# Patient Record
Sex: Male | Born: 1935
Health system: Southern US, Community
[De-identification: ages and names within clinical notes are randomized; demographics above are authoritative.]

## PROBLEM LIST (undated history)

## (undated) DIAGNOSIS — G250 Essential tremor: Secondary | ICD-10-CM

## (undated) DIAGNOSIS — J439 Emphysema, unspecified: Secondary | ICD-10-CM

## (undated) DIAGNOSIS — I10 Essential (primary) hypertension: Secondary | ICD-10-CM

## (undated) HISTORY — DX: Essential tremor: G25.0

## (undated) HISTORY — DX: Essential (primary) hypertension: I10

## (undated) HISTORY — DX: Emphysema, unspecified: J43.9

---

## 2015-01-13 ENCOUNTER — Ambulatory Visit (HOSPITAL_COMMUNITY)
Admission: RE | Admit: 2015-01-13 | Discharge: 2015-01-13 | Disposition: A | Discharge status: Home / Self Care | Payer: Medicare Other | Source: Ambulatory Visit | Attending: Family Medicine | Admitting: Family Medicine

## 2015-01-13 ENCOUNTER — Other Ambulatory Visit (HOSPITAL_COMMUNITY): Payer: Self-pay | Admitting: Family Medicine

## 2015-01-13 DIAGNOSIS — J449 Chronic obstructive pulmonary disease, unspecified: Secondary | ICD-10-CM | POA: Insufficient documentation

## 2015-01-13 DIAGNOSIS — R0602 Shortness of breath: Secondary | ICD-10-CM

## 2016-07-23 ENCOUNTER — Other Ambulatory Visit (HOSPITAL_COMMUNITY): Payer: Self-pay | Admitting: Family Medicine

## 2016-07-23 ENCOUNTER — Ambulatory Visit (HOSPITAL_COMMUNITY)
Admission: RE | Admit: 2016-07-23 | Discharge: 2016-07-23 | Disposition: A | Discharge status: Home / Self Care | Payer: Medicare Other | Source: Ambulatory Visit | Attending: Family Medicine | Admitting: Family Medicine

## 2016-07-23 DIAGNOSIS — M1990 Unspecified osteoarthritis, unspecified site: Secondary | ICD-10-CM

## 2016-07-23 DIAGNOSIS — M25512 Pain in left shoulder: Secondary | ICD-10-CM

## 2016-07-23 DIAGNOSIS — G8929 Other chronic pain: Secondary | ICD-10-CM

## 2016-07-23 DIAGNOSIS — M19012 Primary osteoarthritis, left shoulder: Secondary | ICD-10-CM | POA: Insufficient documentation

## 2016-07-23 DIAGNOSIS — M1711 Unilateral primary osteoarthritis, right knee: Secondary | ICD-10-CM | POA: Insufficient documentation

## 2016-07-23 DIAGNOSIS — M25561 Pain in right knee: Secondary | ICD-10-CM | POA: Diagnosis not present

## 2018-10-30 IMAGING — DX DG KNEE COMPLETE 4+V*R*
4 series · 4 of 4 positions shown · non-contrast
Comparison: None in PACs

CLINICAL DATA: Chronic right knee pain with no known injury

EXAM:
RIGHT KNEE - COMPLETE 4+ VIEW

[knee ap]
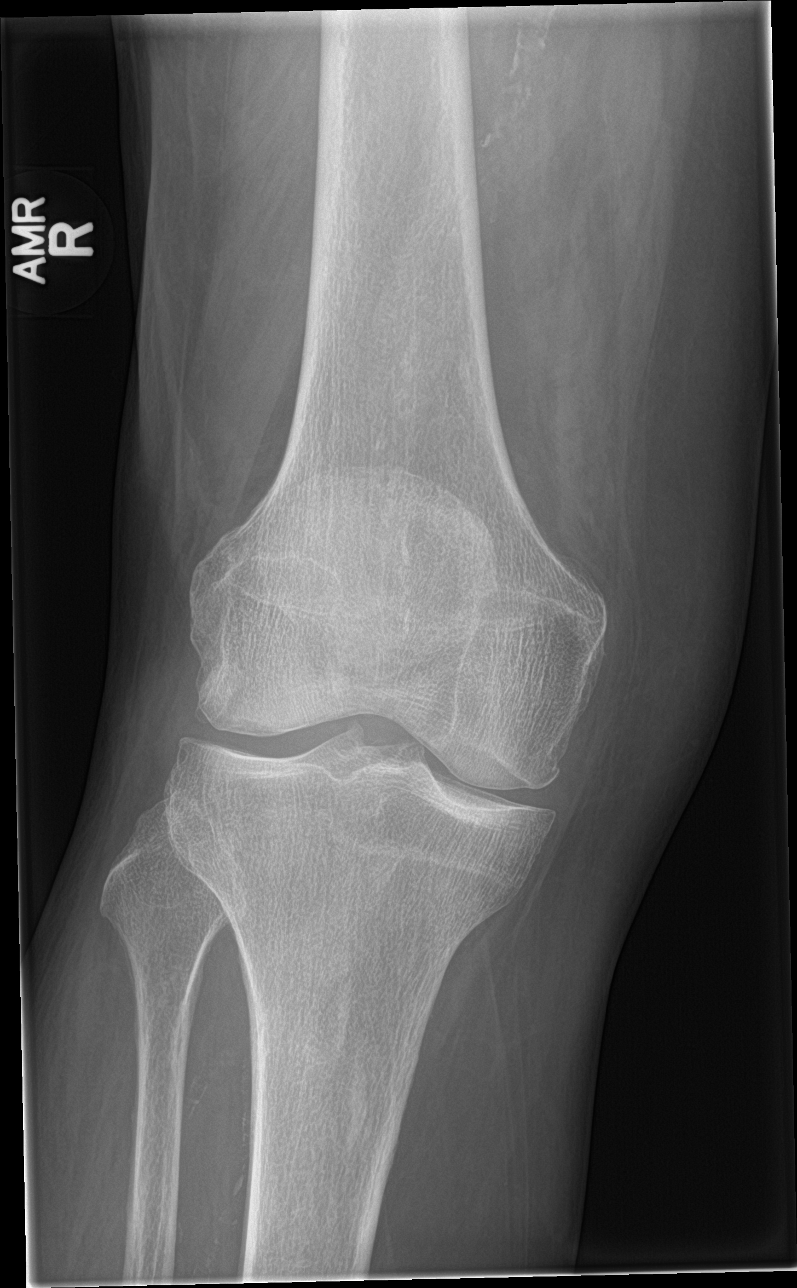

[tunnel]
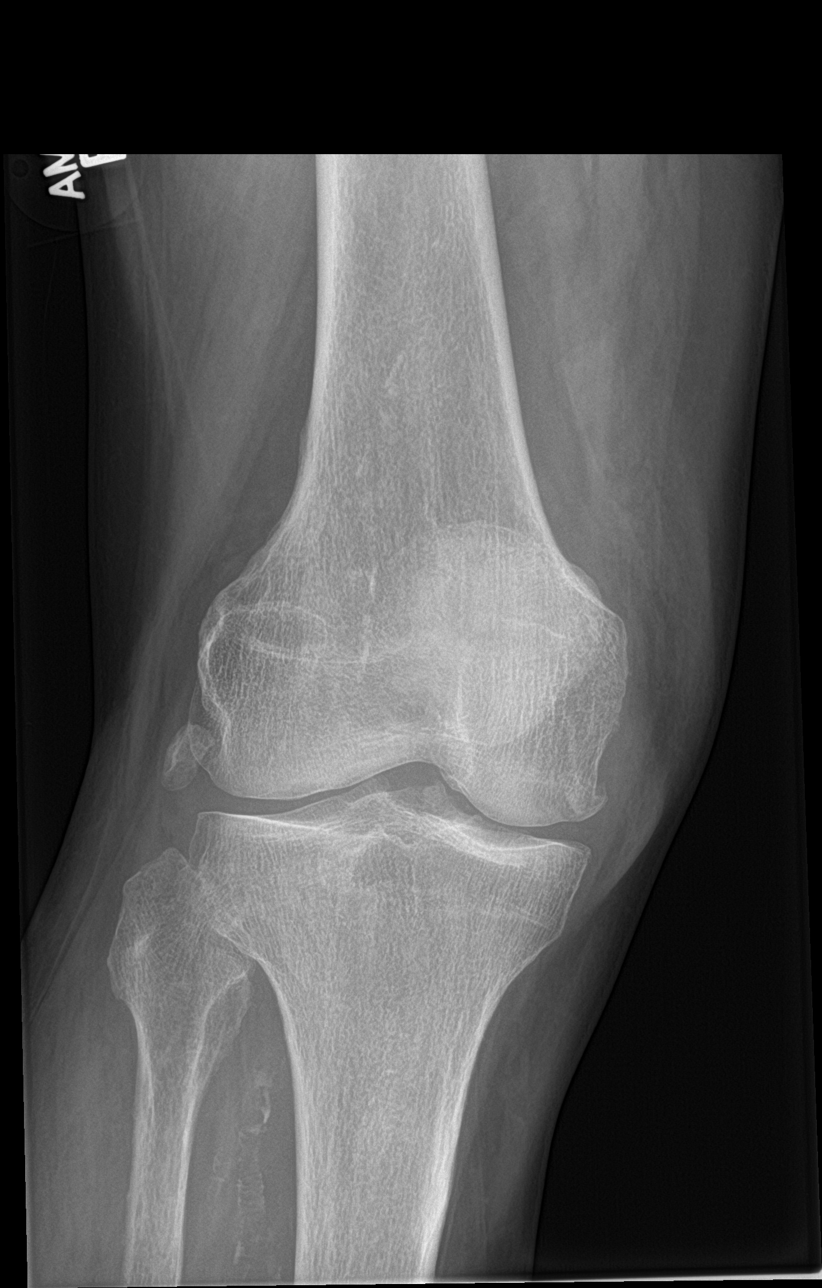

[knee lat]
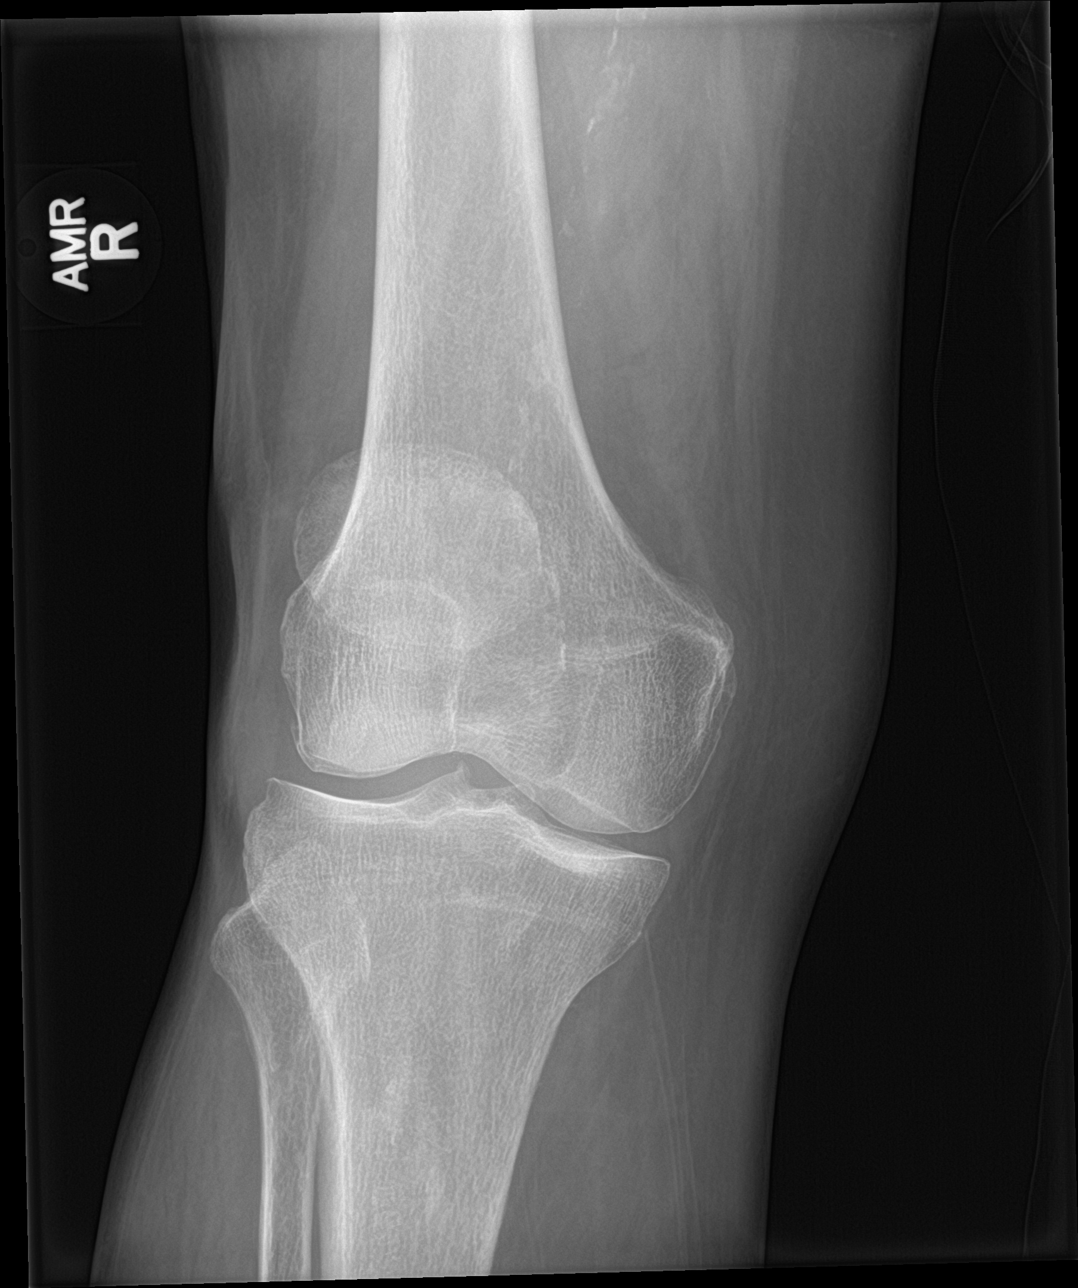

[knee sunrise]
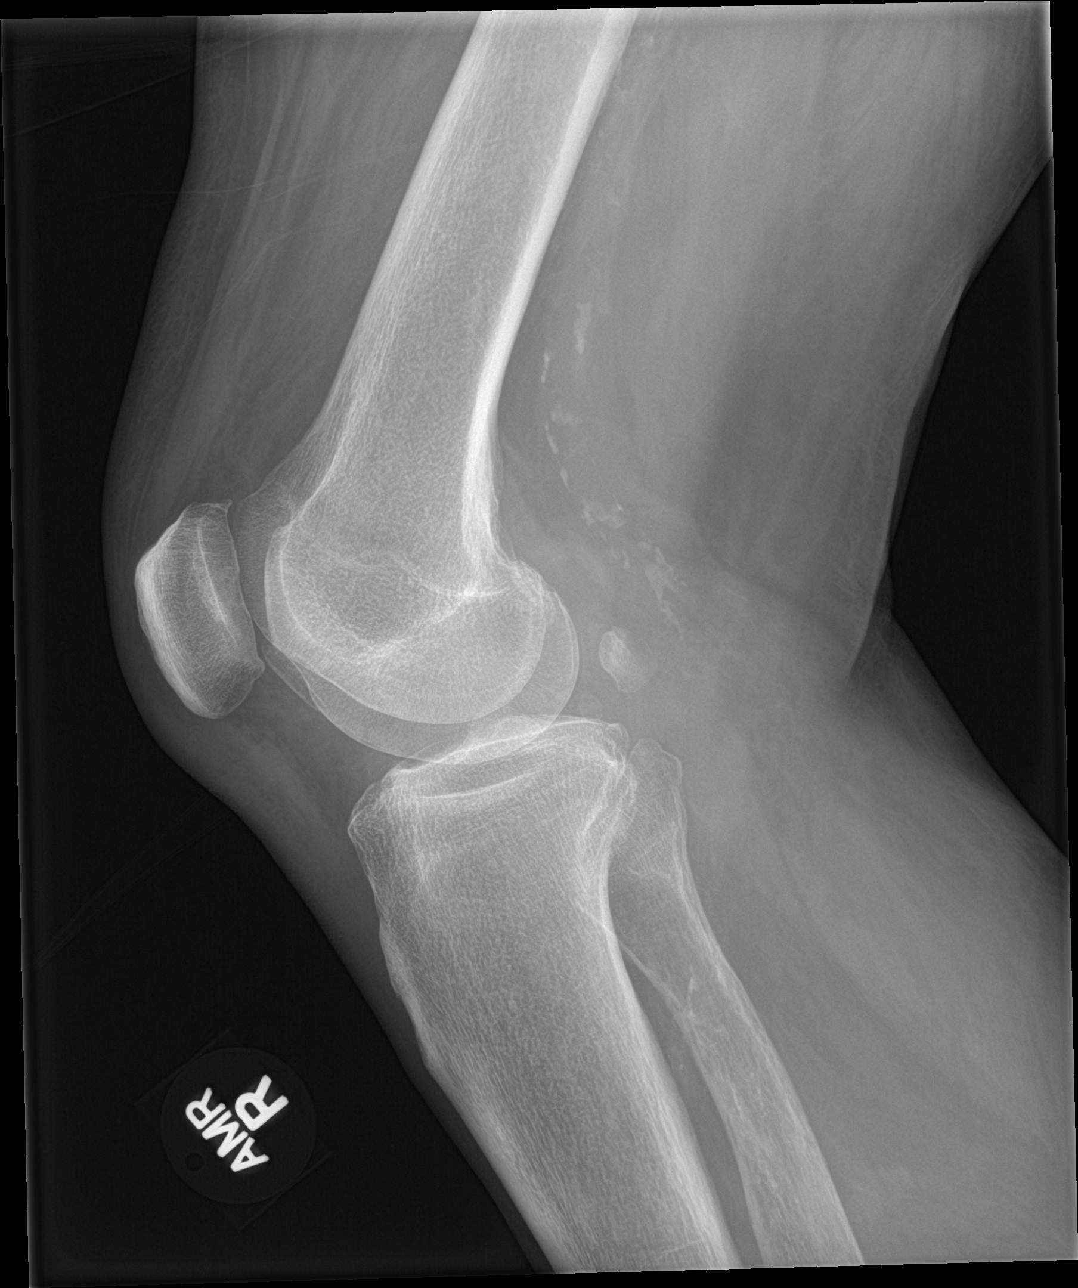

[4 of 4 positions shown; findings below may reference images not displayed]

FINDINGS: The bones are subjectively osteopenic. There is mild narrowing of
the medial joint compartment. There is mild beaking of the tibial
spines. The tibial plateaus are intact. Small spurs arise from the
articular margins of the patella. There is no joint effusion. A
fabella is present. There is calcification in the wall of the
popliteal artery.
IMPRESSION: Mild osteoarthritic changes of all 3 joint compartments. No acute
bony abnormality.

## 2020-12-03 DIAGNOSIS — I10 Essential (primary) hypertension: Secondary | ICD-10-CM | POA: Diagnosis not present

## 2020-12-03 DIAGNOSIS — J029 Acute pharyngitis, unspecified: Secondary | ICD-10-CM | POA: Diagnosis not present

## 2020-12-03 DIAGNOSIS — B349 Viral infection, unspecified: Secondary | ICD-10-CM | POA: Diagnosis not present

## 2020-12-03 DIAGNOSIS — R0602 Shortness of breath: Secondary | ICD-10-CM | POA: Diagnosis not present

## 2020-12-03 DIAGNOSIS — J439 Emphysema, unspecified: Secondary | ICD-10-CM | POA: Diagnosis not present

## 2020-12-03 DIAGNOSIS — R059 Cough, unspecified: Secondary | ICD-10-CM | POA: Diagnosis not present

## 2020-12-03 DIAGNOSIS — Z20822 Contact with and (suspected) exposure to covid-19: Secondary | ICD-10-CM | POA: Diagnosis not present

## 2020-12-04 DIAGNOSIS — I10 Essential (primary) hypertension: Secondary | ICD-10-CM | POA: Diagnosis not present

## 2021-01-10 DIAGNOSIS — E7849 Other hyperlipidemia: Secondary | ICD-10-CM | POA: Diagnosis not present

## 2021-01-10 DIAGNOSIS — K219 Gastro-esophageal reflux disease without esophagitis: Secondary | ICD-10-CM | POA: Diagnosis not present

## 2021-01-10 DIAGNOSIS — J449 Chronic obstructive pulmonary disease, unspecified: Secondary | ICD-10-CM | POA: Diagnosis not present

## 2021-02-02 DIAGNOSIS — E559 Vitamin D deficiency, unspecified: Secondary | ICD-10-CM | POA: Diagnosis not present

## 2021-02-02 DIAGNOSIS — E1165 Type 2 diabetes mellitus with hyperglycemia: Secondary | ICD-10-CM | POA: Diagnosis not present

## 2021-02-02 DIAGNOSIS — D510 Vitamin B12 deficiency anemia due to intrinsic factor deficiency: Secondary | ICD-10-CM | POA: Diagnosis not present

## 2021-02-02 DIAGNOSIS — I110 Hypertensive heart disease with heart failure: Secondary | ICD-10-CM | POA: Diagnosis not present

## 2021-02-02 DIAGNOSIS — E7849 Other hyperlipidemia: Secondary | ICD-10-CM | POA: Diagnosis not present

## 2021-02-07 DIAGNOSIS — H1011 Acute atopic conjunctivitis, right eye: Secondary | ICD-10-CM | POA: Diagnosis not present

## 2021-02-07 DIAGNOSIS — I119 Hypertensive heart disease without heart failure: Secondary | ICD-10-CM | POA: Diagnosis not present

## 2021-02-07 DIAGNOSIS — E039 Hypothyroidism, unspecified: Secondary | ICD-10-CM | POA: Diagnosis not present

## 2021-02-07 DIAGNOSIS — K219 Gastro-esophageal reflux disease without esophagitis: Secondary | ICD-10-CM | POA: Diagnosis not present

## 2021-05-23 ENCOUNTER — Encounter (INDEPENDENT_AMBULATORY_CARE_PROVIDER_SITE_OTHER): Payer: Self-pay

## 2021-05-23 ENCOUNTER — Ambulatory Visit (INDEPENDENT_AMBULATORY_CARE_PROVIDER_SITE_OTHER): Payer: Medicare Other | Admitting: Internal Medicine

## 2021-05-23 ENCOUNTER — Other Ambulatory Visit: Payer: Self-pay

## 2021-05-23 ENCOUNTER — Encounter: Payer: Self-pay | Admitting: Internal Medicine

## 2021-05-23 VITALS — BP 134/62 | HR 66 | Temp 98.0°F | Resp 18 | Ht 71.0 in | Wt 176.0 lb

## 2021-05-23 DIAGNOSIS — I10 Essential (primary) hypertension: Secondary | ICD-10-CM

## 2021-05-23 DIAGNOSIS — J432 Centrilobular emphysema: Secondary | ICD-10-CM | POA: Diagnosis not present

## 2021-05-23 DIAGNOSIS — G250 Essential tremor: Secondary | ICD-10-CM

## 2021-05-23 DIAGNOSIS — Z7689 Persons encountering health services in other specified circumstances: Secondary | ICD-10-CM

## 2021-05-23 DIAGNOSIS — E559 Vitamin D deficiency, unspecified: Secondary | ICD-10-CM

## 2021-05-23 MED ORDER — ALBUTEROL SULFATE HFA 108 (90 BASE) MCG/ACT IN AERS: 2.0000 | INHALATION_SPRAY | Freq: Four times a day (QID) | RESPIRATORY_TRACT | 11 refills | Status: DC | PRN

## 2021-05-23 MED ORDER — MONTELUKAST SODIUM 10 MG PO TABS: 10.0000 mg | ORAL_TABLET | Freq: Every day | ORAL | 3 refills | Status: DC

## 2021-05-23 MED ORDER — ALBUTEROL SULFATE HFA 108 (90 BASE) MCG/ACT IN AERS: 2.0000 | INHALATION_SPRAY | Freq: Four times a day (QID) | RESPIRATORY_TRACT | 0 refills | Status: DC | PRN

## 2021-05-23 NOTE — Assessment & Plan Note (Signed)
Care established Previous chart reviewed History and medications reviewed with the patient 

## 2021-05-23 NOTE — Assessment & Plan Note (Signed)
On Vitamin D 5000 IU QD, advised to take Vitamin D 2000 IU QD for now Check Vitamin D level

## 2021-05-23 NOTE — Progress Notes (Signed)
New Patient Office Visit  Subjective:  Patient ID: Martin Ford, male    DOB: 1936/02/09  Age: 85 y.o. MRN: [id number]  CC:  Chief Complaint  Patient presents with   New Patient (Initial Visit)    New patient was seeing dr Cindie Laroche has emphysema but feels sob the last 2 years thinks this is getting worse     HPI Martin Ford is a 85 y.o. male with PMH of COPD, HTN and essential tremors who presents for establishing care. He is a former patient of Dr Cindie Laroche.  HTN: BP is well-controlled. Takes medications regularly. Patient denies headache, dizziness, chest pain, or palpitations.  COPD: He c/o chronic dyspnea, worse with exertion. He uses Trelegy and PRN Albuterol nebs and inhaler. His breathing has been getting worse. He also has chronic cough, but denies any recent fever, chills, change in sputum production or hemoptysis. Denies any recent weight loss.  Essential tremors: He has been taking Atenolol for tremors and HTN. His tremors are well-controlled currently. Denies any recent falls or bradykinesia.  He has had 2 doses of COVID vaccine.    Past Medical History:  Diagnosis Date   Emphysema of lung ([place])    Essential tremor    Hypertension     History reviewed. No pertinent surgical history.  History reviewed. No pertinent family history.  Social History   Socioeconomic History   Marital status: Married    Spouse name: Not on file   Number of children: Not on file   Years of education: Not on file   Highest education level: Not on file  Occupational History   Not on file  Tobacco Use   Smoking status: Former    Types: Cigarettes   Smokeless tobacco: Never  Substance and Sexual Activity   Alcohol use: Not Currently   Drug use: Never   Sexual activity: Not on file  Other Topics Concern   Not on file  Social History Narrative   Not on file   Social Determinants of Health   Financial Resource Strain: Not on file  Food Insecurity: Not on file   Transportation Needs: Not on file  Physical Activity: Not on file  Stress: Not on file  Social Connections: Not on file  Intimate Partner Violence: Not on file    ROS Review of Systems  Constitutional:  Negative for chills and fever.  HENT:  Negative for congestion and sore throat.   Eyes:  Negative for pain and discharge.  Respiratory:  Positive for cough and shortness of breath.   Cardiovascular:  Negative for chest pain and palpitations.  Gastrointestinal:  Negative for constipation, diarrhea, nausea and vomiting.  Endocrine: Negative for polydipsia and polyuria.  Genitourinary:  Negative for dysuria and hematuria.  Musculoskeletal:  Negative for neck pain and neck stiffness.  Skin:  Negative for rash.  Neurological:  Negative for dizziness, weakness, numbness and headaches.  Psychiatric/Behavioral:  Negative for agitation and behavioral problems.    Objective:   Today's Vitals: BP 134/62 (BP Location: Left Arm, Cuff Size: Normal)   Pulse 66   Temp 98 F (36.7 C) (Oral)   Resp 18   Ht 5' 11"  (1.803 m)   Wt 176 lb 0.6 oz (79.9 kg)   SpO2 91%   BMI 24.55 kg/m   Physical Exam Vitals reviewed.  Constitutional:      General: He is not in acute distress.    Appearance: He is not diaphoretic.  HENT:     Head: Normocephalic and  atraumatic.     Nose: Nose normal.     Mouth/Throat:     Mouth: Mucous membranes are moist.  Eyes:     General: No scleral icterus.    Extraocular Movements: Extraocular movements intact.  Cardiovascular:     Rate and Rhythm: Normal rate and regular rhythm.     Pulses: Normal pulses.     Heart sounds: Normal heart sounds. No murmur heard. Pulmonary:     Breath sounds: No wheezing or rales.     Comments: Decreased breath sounds at b/l lung bases Abdominal:     Palpations: Abdomen is soft.     Tenderness: There is no abdominal tenderness.  Musculoskeletal:     Cervical back: Neck supple. No tenderness.     Right lower leg: No edema.      Left lower leg: No edema.  Skin:    General: Skin is warm.     Findings: No erythema.  Neurological:     General: No focal deficit present.     Mental Status: He is alert and oriented to person, place, and time.  Psychiatric:        Mood and Affect: Mood normal.        Behavior: Behavior normal.    Assessment & Plan:   Problem List Items Addressed This Visit       Cardiovascular and Mediastinum   Essential hypertension    BP Readings from Last 1 Encounters:  05/23/21 134/62  Well-controlled with Atenolol (due to h/o tremors) Counseled for compliance with the medications Advised DASH diet and moderate exercise/walking as tolerated       Relevant Medications   atenolol (TENORMIN) 50 MG tablet   Other Relevant Orders   CMP14+EGFR   Lipid panel   TSH     Respiratory   Centrilobular emphysema ([facility])    On Trelegy and PRN Albuterol nebs and inhaler Quit smoking Added Singulair 10 mg QD Referred to Pulmology      Relevant Medications   TRELEGY ELLIPTA 100-62.5-25 MCG/INH AEPB   albuterol (PROVENTIL) (2.5 MG/3ML) 0.083% nebulizer solution   montelukast (SINGULAIR) 10 MG tablet   albuterol (VENTOLIN HFA) 108 (90 Base) MCG/ACT inhaler   Other Relevant Orders   Ambulatory referral to Pulmonology     Nervous and Auditory   Essential tremor   Relevant Medications   montelukast (SINGULAIR) 10 MG tablet   Other Relevant Orders   TSH     Other   Encounter to establish care - Primary    Care established Previous chart reviewed History and medications reviewed with the patient      Relevant Orders   CBC with Differential/Platelet   CMP14+EGFR   Lipid panel   Vitamin D deficiency    On Vitamin D 5000 IU QD, advised to take Vitamin D 2000 IU QD for now Check Vitamin D level      Relevant Orders   Vitamin D (25 hydroxy)    Outpatient Encounter Medications as of 05/23/2021  Medication Sig   albuterol (PROVENTIL) (2.5 MG/3ML) 0.083% nebulizer solution SMARTSIG:1  Vial(s) Via Nebulizer 4 Times Daily PRN   atenolol (TENORMIN) 50 MG tablet Take 50 mg by mouth every morning.   Cholecalciferol (VITAMIN D3 ULTRA STRENGTH) 125 MCG (5000 UT) capsule Take 5,000 Units by mouth daily.   montelukast (SINGULAIR) 10 MG tablet Take 1 tablet (10 mg total) by mouth at bedtime.   TRELEGY ELLIPTA 100-62.5-25 MCG/INH AEPB Inhale 1 puff into the lungs daily.   [DISCONTINUED]  albuterol (VENTOLIN HFA) 108 (90 Base) MCG/ACT inhaler Inhale 2 puffs into the lungs every 6 (six) hours as needed for wheezing or shortness of breath.   albuterol (VENTOLIN HFA) 108 (90 Base) MCG/ACT inhaler Inhale 2 puffs into the lungs every 6 (six) hours as needed for wheezing or shortness of breath.   No facility-administered encounter medications on file as of 05/23/2021.    Follow-up: Return in about 4 months (around 09/22/2021) for COPD and HTN.   Lindell Spar, MD

## 2021-05-23 NOTE — Patient Instructions (Signed)
Please start taking Singulair as prescribed.  Please continue to take other medications as prescribed.  Please continue to follow low salt diet and ambulate as tolerated.  Please get fasting blood tests before the next visit.  You are being referred to Pulmonology for COPD.

## 2021-05-23 NOTE — Assessment & Plan Note (Signed)
On Trelegy and PRN Albuterol nebs and inhaler Quit smoking Added Singulair 10 mg QD Referred to Pulmology

## 2021-05-23 NOTE — Assessment & Plan Note (Addendum)
BP Readings from Last 1 Encounters:  05/23/21 134/62   Well-controlled with Atenolol (due to h/o tremors) Counseled for compliance with the medications Advised DASH diet and moderate exercise/walking as tolerated

## 2021-05-30 ENCOUNTER — Ambulatory Visit (INDEPENDENT_AMBULATORY_CARE_PROVIDER_SITE_OTHER): Payer: Medicare Other

## 2021-05-30 ENCOUNTER — Other Ambulatory Visit: Payer: Self-pay

## 2021-05-30 DIAGNOSIS — Z0000 Encounter for general adult medical examination without abnormal findings: Secondary | ICD-10-CM

## 2021-05-30 NOTE — Patient Instructions (Signed)
Health Maintenance, Male Adopting a healthy lifestyle and getting preventive care are important in promoting health and wellness. Ask your health care provider about: The right schedule for you to have regular tests and exams. Things you can do on your own to prevent diseases and keep yourself healthy. What should I know about diet, weight, and exercise? Eat a healthy diet  Eat a diet that includes plenty of vegetables, fruits, low-fat dairy products, and lean protein. Do not eat a lot of foods that are high in solid fats, added sugars, or sodium. Maintain a healthy weight Body mass index (BMI) is a measurement that can be used to identify possible weight problems. It estimates body fat based on height and weight. Your health care provider can help determine your BMI and help you achieve or maintain a healthy weight. Get regular exercise Get regular exercise. This is one of the most important things you can do for your health. Most adults should: Exercise for at least 150 minutes each week. The exercise should increase your heart rate and make you sweat (moderate-intensity exercise). Do strengthening exercises at least twice a week. This is in addition to the moderate-intensity exercise. Spend less time sitting. Even light physical activity can be beneficial. Watch cholesterol and blood lipids Have your blood tested for lipids and cholesterol at 83 years of age, then have this test every 5 years. You may need to have your cholesterol levels checked more often if: Your lipid or cholesterol levels are high. You are older than 83 years of age. You are at high risk for heart disease. What should I know about cancer screening? Many types of cancers can be detected early and may often be prevented. Depending on your health history and family history, you may need to have cancer screening at various ages. This may include screening for: Colorectal cancer. Prostate cancer. Skin cancer. Lung  cancer. What should I know about heart disease, diabetes, and high blood pressure? Blood pressure and heart disease High blood pressure causes heart disease and increases the risk of stroke. This is more likely to develop in people who have high blood pressure readings, are of African descent, or are overweight. Talk with your health care provider about your target blood pressure readings. Have your blood pressure checked: Every 3-5 years if you are 18-39 years of age. Every year if you are 40 years old or older. If you are between the ages of 65 and 75 and are a current or former smoker, ask your health care provider if you should have a one-time screening for abdominal aortic aneurysm (AAA). Diabetes Have regular diabetes screenings. This checks your fasting blood sugar level. Have the screening done: Once every three years after age 45 if you are at a normal weight and have a low risk for diabetes. More often and at a younger age if you are overweight or have a high risk for diabetes. What should I know about preventing infection? Hepatitis B If you have a higher risk for hepatitis B, you should be screened for this virus. Talk with your health care provider to find out if you are at risk for hepatitis B infection. Hepatitis C Blood testing is recommended for: Everyone born from 1945 through 1965. Anyone with known risk factors for hepatitis C. Sexually transmitted infections (STIs) You should be screened each year for STIs, including gonorrhea and chlamydia, if: You are sexually active and are younger than 83 years of age. You are older than 83 years   of age and your health care provider tells you that you are at risk for this type of infection. Your sexual activity has changed since you were last screened, and you are at increased risk for chlamydia or gonorrhea. Ask your health care provider if you are at risk. Ask your health care provider about whether you are at high risk for HIV.  Your health care provider may recommend a prescription medicine to help prevent HIV infection. If you choose to take medicine to prevent HIV, you should first get tested for HIV. You should then be tested every 3 months for as long as you are taking the medicine. Follow these instructions at home: Lifestyle Do not use any products that contain nicotine or tobacco, such as cigarettes, e-cigarettes, and chewing tobacco. If you need help quitting, ask your health care provider. Do not use street drugs. Do not share needles. Ask your health care provider for help if you need support or information about quitting drugs. Alcohol use Do not drink alcohol if your health care provider tells you not to drink. If you drink alcohol: Limit how much you have to 0-2 drinks a day. Be aware of how much alcohol is in your drink. In the [place] one drink equals one 12 oz bottle of beer (355 mL), one 5 oz glass of wine (148 mL), or one 1 oz glass of hard liquor (44 mL). General instructions Schedule regular health, dental, and eye exams. Stay current with your vaccines. Tell your health care provider if: You often feel depressed. You have ever been abused or do not feel safe at home. Summary Adopting a healthy lifestyle and getting preventive care are important in promoting health and wellness. Follow your health care provider's instructions about healthy diet, exercising, and getting tested or screened for diseases. Follow your health care provider's instructions on monitoring your cholesterol and blood pressure. This information is not intended to replace advice given to you by your health care provider. Make sure you discuss any questions you have with your health care provider. Document Revised: 10/28/2020 Document Reviewed: 08/13/2018 [employer] Patient Education  2022 [employer]  

## 2021-05-30 NOTE — Progress Notes (Signed)
Subjective:   Martin Ford is a 85 y.o. male who presents for an Initial Medicare Annual Wellness Visit. I connected with  Martin Ford on 05/30/21 by a audio enabled telemedicine application and verified that I am speaking with the correct person using two identifiers.  Patient Location: Home  Provider Location: Home Office  Patient is being assisted by his wife due to hearing problems.  I discussed the limitations of evaluation and management by telemedicine. The patient expressed understanding and agreed to proceed.   Review of Systems    Defer to PCP       Objective:    Today's Vitals   05/30/21 1338  PainSc: 5    There is no height or weight on file to calculate BMI.  Advanced Directives 05/30/2021  Does Patient Have a Medical Advance Directive? Yes  Type of Estate agent of [place];Living will    Current Medications (verified) Outpatient Encounter Medications as of 05/30/2021  Medication Sig   albuterol (PROVENTIL) (2.5 MG/3ML) 0.083% nebulizer solution SMARTSIG:1 Vial(s) Via Nebulizer 4 Times Daily PRN   albuterol (VENTOLIN HFA) 108 (90 Base) MCG/ACT inhaler Inhale 2 puffs into the lungs every 6 (six) hours as needed for wheezing or shortness of breath.   atenolol (TENORMIN) 50 MG tablet Take 50 mg by mouth every morning.   Cholecalciferol (VITAMIN D3 ULTRA STRENGTH) 125 MCG (5000 UT) capsule Take 5,000 Units by mouth daily.   montelukast (SINGULAIR) 10 MG tablet Take 1 tablet (10 mg total) by mouth at bedtime.   TRELEGY ELLIPTA 100-62.5-25 MCG/INH AEPB Inhale 1 puff into the lungs daily.   No facility-administered encounter medications on file as of 05/30/2021.    Allergies (verified) Patient has no known allergies.   History: Past Medical History:  Diagnosis Date   Emphysema of lung (HCC)    Essential tremor    Hypertension    History reviewed. No pertinent surgical history. History reviewed. No pertinent family history. Social  History   Socioeconomic History   Marital status: Married    Spouse name: Not on file   Number of children: Not on file   Years of education: Not on file   Highest education level: Not on file  Occupational History   Not on file  Tobacco Use   Smoking status: Former    Types: Cigarettes   Smokeless tobacco: Never  Substance and Sexual Activity   Alcohol use: Yes    Alcohol/week: 2.0 standard drinks    Types: 2 Cans of beer per week   Drug use: Never   Sexual activity: Not on file  Other Topics Concern   Not on file  Social History Narrative   Not on file   Social Determinants of Health   Financial Resource Strain: Low Risk    Difficulty of Paying Living Expenses: Not hard at all  Food Insecurity: No Food Insecurity   Worried About Programme researcher, broadcasting/film/video in the Last Year: Never true   Ran Out of Food in the Last Year: Never true  Transportation Needs: No Transportation Needs   Lack of Transportation (Medical): No   Lack of Transportation (Non-Medical): No  Physical Activity: Inactive   Days of Exercise per Week: 0 days   Minutes of Exercise per Session: 0 min  Stress: No Stress Concern Present   Feeling of Stress : Not at all  Social Connections: Moderately Isolated   Frequency of Communication with Friends and Family: Never   Frequency of Social Gatherings with  Friends and Family: Twice a week   Attends Religious Services: Never   Database administrator or Organizations: Yes   Attends Engineer, structural: 1 to 4 times per year   Marital Status: Married    Tobacco Counseling Counseling given: Not Answered   Clinical Intake:  Pre-visit preparation completed: Yes  Pain : 0-10 Pain Score: 5  Pain Type: Chronic pain Pain Location: Leg Pain Orientation: Right     Diabetes: No  How often do you need to have someone help you when you read instructions, pamphlets, or other written materials from your doctor or pharmacy?: 3 - Sometimes What is the last  grade level you completed in school?: 12TH GRADE  Diabetic?NO  Interpreter Needed?: No  Information entered by :: Martin Ford   Activities of Daily Living In your present state of health, do you have any difficulty performing the following activities: 05/30/2021 05/23/2021  Hearing? Martin Ford  Vision? N N  Difficulty concentrating or making decisions? N N  Walking or climbing stairs? Y Y  Dressing or bathing? N N  Doing errands, shopping? N N  Preparing Food and eating ? N -  Using the Toilet? N -  In the past six months, have you accidently leaked urine? N -  Do you have problems with loss of bowel control? N -  Managing your Medications? N -  Managing your Finances? N -  Housekeeping or managing your Housekeeping? N -  Some recent data might be hidden    Patient Care Team: Anabel Halon, MD as PCP - General (Internal Medicine)  Indicate any recent Medical Services you may have received from other than [facility] providers in the past year (date may be approximate).     Assessment:   This is a routine wellness examination for Martin Ford.  Hearing/Vision screen No results found.  Dietary issues and exercise activities discussed: Current Exercise Habits: The patient does not participate in regular exercise at present   Goals Addressed   None    Depression Screen PHQ 2/9 Scores 05/30/2021 05/23/2021  PHQ - 2 Score 0 0    Fall Risk Fall Risk  05/23/2021  Falls in the past year? 1  Number falls in past yr: 0  Injury with Fall? 0  Risk for fall due to : History of fall(s);Impaired balance/gait;Impaired mobility  Follow up Falls evaluation completed;Education provided;Falls prevention discussed;Follow up appointment    FALL RISK PREVENTION PERTAINING TO THE HOME:  Any stairs in or around the home? Yes  If so, are there any without handrails? No  Home free of loose throw rugs in walkways, pet beds, electrical cords, etc? Yes  Adequate lighting in your home to reduce risk of  falls? Yes   ASSISTIVE DEVICES UTILIZED TO PREVENT FALLS:  Life alert? No  Use of a cane, walker or w/c? Yes  Grab bars in the bathroom? No  Shower chair or bench in shower? Yes  Elevated toilet seat or a handicapped toilet? No   TIMED UP AND GO:  Was the test performed?  N/A .  Length of time to ambulate 10 feet: N/A sec.     Cognitive Function:     6CIT Screen 05/30/2021  What Year? 0 points  What month? 0 points  What time? 0 points  Count back from 20 0 points  Months in reverse 2 points  Repeat phrase 10 points  Total Score 12    Immunizations Immunization History  Administered Date(s) Administered  Linwood Dibbles (J&J) SARS-COV-2 Vaccination 03/23/2020   Moderna Sars-Covid-2 Vaccination 08/24/2020    TDAP status: Due, Education has been provided regarding the importance of this vaccine. Advised may receive this vaccine at local pharmacy or Health Dept. Aware to provide a copy of the vaccination record if obtained from local pharmacy or Health Dept. Verbalized acceptance and understanding.  Flu Vaccine status: Due, Education has been provided regarding the importance of this vaccine. Advised may receive this vaccine at local pharmacy or Health Dept. Aware to provide a copy of the vaccination record if obtained from local pharmacy or Health Dept. Verbalized acceptance and understanding.    Covid-19 vaccine status: Information provided on how to obtain vaccines.   Qualifies for Shingles Vaccine? Yes   Zostavax completed No   Shingrix Completed?: No.    Education has been provided regarding the importance of this vaccine. Patient has been advised to call insurance company to determine out of pocket expense if they have not yet received this vaccine. Advised may also receive vaccine at local pharmacy or Health Dept. Verbalized acceptance and understanding.  Screening Tests Health Maintenance  Topic Date Due   TETANUS/TDAP  Never done   Zoster Vaccines- Shingrix (1 of 2)  Never done   COVID-19 Vaccine (3 - Booster for [employer] series) 12/23/2020   INFLUENZA VACCINE  Never done   HPV VACCINES  Aged Out    Health Maintenance  Health Maintenance Due  Topic Date Due   TETANUS/TDAP  Never done   Zoster Vaccines- Shingrix (1 of 2) Never done   COVID-19 Vaccine (3 - Booster for [employer] series) 12/23/2020   INFLUENZA VACCINE  Never done    Colorectal cancer screening: No longer required.   Lung Cancer Screening: (Low Dose CT Chest recommended if Age 22-80 years, 30 pack-year currently smoking OR have quit w/in 15years.) does qualify.   Lung Cancer Screening Referral: NO  Additional Screening:  Hepatitis C Screening: does not qualify; No longer required   Vision Screening: Recommended annual ophthalmology exams for early detection of glaucoma and other disorders of the eye. Is the patient up to date with their annual eye exam?  Yes  Who is the provider or what is the name of the office in which the patient attends annual eye exams? [facility]  If pt is not established with a provider, would they like to be referred to a provider to establish care? No .   Dental Screening: Recommended annual dental exams for proper oral hygiene  Community Resource Referral / Chronic Care Management: CRR required this visit?  No   CCM required this visit?  No      Plan:     I have personally reviewed and noted the following in the patient's chart:   Medical and social history Use of alcohol, tobacco or illicit drugs  Current medications and supplements including opioid prescriptions. Patient is not currently taking opioid prescriptions. Functional ability and status Nutritional status Physical activity Advanced directives List of other physicians Hospitalizations, surgeries, and ER visits in previous 12 months Vitals Screenings to include cognitive, depression, and falls Referrals and appointments  In addition, I have reviewed and discussed with  patient certain preventive protocols, quality metrics, and best practice recommendations. A written personalized care plan for preventive services as well as general preventive health recommendations were provided to patient.     Victorio Palm, [facility]   05/30/2021   Nurse Notes: Non Face to Face 30 minute visit  Mr. Voorhis , Thank you for taking time to come for your Medicare Wellness Visit. I appreciate your ongoing commitment to your health goals. Please review the following plan we discussed and let me know if I can assist you in the future.   These are the goals we discussed:  Goals   None     This is a list of the screening recommended for you and due dates:  Health Maintenance  Topic Date Due   Tetanus Vaccine  Never done   Zoster (Shingles) Vaccine (1 of 2) Never done   COVID-19 Vaccine (3 - Booster for [employer] series) 12/23/2020   Flu Shot  Never done   HPV Vaccine  Aged Out

## 2021-07-18 ENCOUNTER — Encounter: Payer: Self-pay | Admitting: Pulmonary Disease

## 2021-07-18 ENCOUNTER — Other Ambulatory Visit: Payer: Self-pay

## 2021-07-18 ENCOUNTER — Ambulatory Visit: Payer: Medicare Other | Admitting: Pulmonary Disease

## 2021-07-18 ENCOUNTER — Ambulatory Visit (HOSPITAL_COMMUNITY)
Admission: RE | Admit: 2021-07-18 | Discharge: 2021-07-18 | Disposition: A | Discharge status: Home / Self Care | Payer: Medicare Other | Source: Ambulatory Visit | Attending: Pulmonary Disease | Admitting: Pulmonary Disease

## 2021-07-18 VITALS — BP 132/86 | HR 64 | Temp 98.2°F | Ht 71.0 in | Wt 182.0 lb

## 2021-07-18 DIAGNOSIS — J449 Chronic obstructive pulmonary disease, unspecified: Secondary | ICD-10-CM

## 2021-07-18 DIAGNOSIS — R062 Wheezing: Secondary | ICD-10-CM | POA: Diagnosis not present

## 2021-07-18 NOTE — Progress Notes (Signed)
[name]  Chief Complaint  Patient presents with   Consult    [facility] for approx. 4 years. Ref by Dr. Allena Katz.     Past Surgical History:  He  has no past surgical history on file.  Past Medical History:  Tremor, HTN  Constitutional:  BP 132/86   Pulse 64   Temp 98.2 F (36.8 C)   Ht 5\' 11"  (1.803 m)   Wt 182 lb (82.6 kg)   SpO2 97% Comment: ra after using inhaler.  BMI 25.38 kg/m   Brief Summary:  Martin Ford is a 85 y.o. male former smoker with COPD.      Subjective:   He is here with his wife.  He is from [place], but has lived in [place] for about 30 years.  He smoked 3 ppd and quit in the 1970's.  He worked in [place].  Never in the Production designer, theatre/television/film.  No history of pneumonia or TB.  No animal/bird exposures.  His grandfather had black lung disease from working in coal mines.    He was told years ago that he has emphysema.  He has not had recent PFT or chest xray.  He gets intermittent cough with clear sputum and wheezing.  Denies chest pain or hemoptysis.  Gets cramps in his legs while sleeping.  Gets winded after walking about 100 feet.  Not having leg swelling.  No history of thromboembolic disease.  He was recently started on trelegy.  This helps, but hasn't been using consistently.  Has to use albuterol few times per day, and this helps.  Recently started on singulair, and his wife feels his breathing has improved since this was started especially his breathing at night.  Ambulatory oximetry on room air today: SpO2 low 96%, HR peak 82.  Physical Exam:   Appearance - well kempt   ENMT - no sinus tenderness, no oral exudate, no LAN, Mallampati 2 airway, no stridor  Respiratory - decreased breath sounds bilaterally, no wheezing or rales  CV - s1s2 regular rate and rhythm, no murmurs  Ext - no clubbing, no edema  Skin - no rashes  Psych - normal mood and affect   Pulmonary testing:    Chest Imaging:     Sleep Tests:    Social History:  He  reports that he has quit smoking. His smoking use included cigarettes. He has never used smokeless tobacco. He reports current alcohol use of about 2.0 standard drinks per week. He reports that he does not use drugs.  Family History:  His family history includes Lung disease in his paternal grandfather.     Assessment/Plan:   COPD with emphysema. - continue trelegy, singulair, and prn albuterol - discussed roles for his different inhalers, and demonstrated inhaler technique - will arrange for chest xray, pulmonary function test, and overnight oximetry - he might benefit from referral to pulmonary rehab; will reassess after review of his tests  Dyspnea on exertion. - if his symptoms persist, then he might also need additional cardiac assessment  Time Spent Involved in Patient Care on Day of Examination:  46 minutes  Follow up:   Patient Instructions  Trelegy one puff daily, and rinse your mouth after each use  Singulair 10 mg pill nightly  Albuterol every 4 hours as needed for cough, wheeze, chest congestion, or shortness of breath  Chest xray today  Will arrange for overnight oxygen test at home  Will schedule pulmonary function test in  [facility] office, and arrange for follow up with Dr. Craige Cotta or Nurse Practitioner after this  Medication List:   Allergies as of 07/18/2021   No Known Allergies      Medication List        Accurate as of July 18, 2021 11:37 AM. If you have any questions, ask your nurse or doctor.          albuterol (2.5 MG/3ML) 0.083% nebulizer solution Commonly known as: PROVENTIL SMARTSIG:1 Vial(s) Via Nebulizer 4 Times Daily PRN   albuterol 108 (90 Base) MCG/ACT inhaler Commonly known as: VENTOLIN HFA Inhale 2 puffs into the lungs every 6 (six) hours as needed for wheezing or shortness of breath.   atenolol 50 MG tablet Commonly known as: TENORMIN Take 50 mg by mouth every morning.    montelukast 10 MG tablet Commonly known as: SINGULAIR Take 1 tablet (10 mg total) by mouth at bedtime.   Trelegy Ellipta 100-62.5-25 MCG/ACT Aepb Generic drug: Fluticasone-Umeclidin-Vilant Inhale 1 puff into the lungs daily.   Vitamin D3 Ultra Strength 125 MCG (5000 UT) capsule Generic drug: Cholecalciferol Take 5,000 Units by mouth daily.        Signature:  Coralyn Helling, MD [facility] Pager - [phone] 07/18/2021, 11:37 AM

## 2021-07-18 NOTE — Patient Instructions (Signed)
Trelegy one puff daily, and rinse your mouth after each use  Singulair 10 mg pill nightly  Albuterol every 4 hours as needed for cough, wheeze, chest congestion, or shortness of breath  Chest xray today  Will arrange for overnight oxygen test at home  Will schedule pulmonary function test in [place] office, and arrange for follow up with Dr. Craige Cotta or Nurse Practitioner after this

## 2021-08-09 ENCOUNTER — Telehealth: Payer: Self-pay | Admitting: Primary Care

## 2021-08-10 NOTE — Telephone Encounter (Signed)
Called pt and spoke with spouse Martin Ford about the message received from [place]. Stated to her that [facility] has been trying to reach them about scheduling ONO and stated to her that we would reach out to [facility] to let them know that we were able to reach pt's spouse about this.  Number to try to reach pt or spouse [name] on is [phone].  Attempted to call Burna Mortimer at [facility] to let her know that I was able to speak with pt but unable to reach. Left message for her to return call.

## 2021-08-16 LAB — CBC WITH DIFFERENTIAL/PLATELET
Basophils Absolute: 0.1 10*3/uL (ref 0.0–0.2)
Basos: 1 %
EOS (ABSOLUTE): 0.3 10*3/uL (ref 0.0–0.4)
Eos: 4 %
Hematocrit: 46 % (ref 37.5–51.0)
Hemoglobin: 15.4 g/dL (ref 13.0–17.7)
Immature Grans (Abs): 0 10*3/uL (ref 0.0–0.1)
Immature Granulocytes: 0 %
Lymphocytes Absolute: 2 10*3/uL (ref 0.7–3.1)
Lymphs: 26 %
MCH: 30.2 pg (ref 26.6–33.0)
MCHC: 33.5 g/dL (ref 31.5–35.7)
MCV: 90 fL (ref 79–97)
Monocytes Absolute: 0.8 10*3/uL (ref 0.1–0.9)
Monocytes: 11 %
Neutrophils Absolute: 4.4 10*3/uL (ref 1.4–7.0)
Neutrophils: 58 %
Platelets: 356 10*3/uL (ref 150–450)
RBC: 5.1 x10E6/uL (ref 4.14–5.80)
RDW: 12.1 % (ref 11.6–15.4)
WBC: 7.6 10*3/uL (ref 3.4–10.8)

## 2021-08-16 LAB — CMP14+EGFR
ALT: 16 IU/L (ref 0–44)
AST: 22 IU/L (ref 0–40)
Albumin/Globulin Ratio: 1.8 (ref 1.2–2.2)
Albumin: 4.6 g/dL (ref 3.6–4.6)
Alkaline Phosphatase: 88 IU/L (ref 44–121)
BUN/Creatinine Ratio: 18 (ref 10–24)
BUN: 18 mg/dL (ref 8–27)
Bilirubin Total: 1.1 mg/dL (ref 0.0–1.2)
CO2: 24 mmol/L (ref 20–29)
Calcium: 9.9 mg/dL (ref 8.6–10.2)
Chloride: 100 mmol/L (ref 96–106)
Creatinine, Ser: 1.01 mg/dL (ref 0.76–1.27)
Globulin, Total: 2.6 g/dL (ref 1.5–4.5)
Glucose: 123 mg/dL — ABNORMAL HIGH (ref 70–99)
Potassium: 5 mmol/L (ref 3.5–5.2)
Sodium: 139 mmol/L (ref 134–144)
Total Protein: 7.2 g/dL (ref 6.0–8.5)
eGFR: 74 mL/min/{1.73_m2} (ref >59)

## 2021-08-16 LAB — LIPID PANEL
Chol/HDL Ratio: 4.2 ratio (ref 0.0–5.0)
Cholesterol, Total: 241 mg/dL — ABNORMAL HIGH (ref 100–199)
HDL: 57 mg/dL (ref >39)
LDL Chol Calc (NIH): 160 mg/dL — ABNORMAL HIGH (ref 0–99)
Triglycerides: 132 mg/dL (ref 0–149)
VLDL Cholesterol Cal: 24 mg/dL (ref 5–40)

## 2021-08-16 LAB — VITAMIN D 25 HYDROXY (VIT D DEFICIENCY, FRACTURES): Vit D, 25-Hydroxy: 60.2 ng/mL (ref 30.0–100.0)

## 2021-08-16 LAB — TSH: TSH: 5.12 u[IU]/mL — ABNORMAL HIGH (ref 0.450–4.500)

## 2021-08-21 ENCOUNTER — Ambulatory Visit (INDEPENDENT_AMBULATORY_CARE_PROVIDER_SITE_OTHER): Payer: Medicare Other | Admitting: Internal Medicine

## 2021-08-21 ENCOUNTER — Other Ambulatory Visit: Payer: Self-pay

## 2021-08-21 ENCOUNTER — Encounter: Payer: Self-pay | Admitting: Internal Medicine

## 2021-08-21 VITALS — BP 136/78 | HR 66 | Resp 18 | Ht 71.0 in | Wt 186.0 lb

## 2021-08-21 DIAGNOSIS — J432 Centrilobular emphysema: Secondary | ICD-10-CM

## 2021-08-21 DIAGNOSIS — E782 Mixed hyperlipidemia: Secondary | ICD-10-CM | POA: Diagnosis not present

## 2021-08-21 DIAGNOSIS — I10 Essential (primary) hypertension: Secondary | ICD-10-CM | POA: Diagnosis not present

## 2021-08-21 DIAGNOSIS — E038 Other specified hypothyroidism: Secondary | ICD-10-CM

## 2021-08-21 DIAGNOSIS — E039 Hypothyroidism, unspecified: Secondary | ICD-10-CM | POA: Insufficient documentation

## 2021-08-21 DIAGNOSIS — R7303 Prediabetes: Secondary | ICD-10-CM

## 2021-08-21 DIAGNOSIS — I739 Peripheral vascular disease, unspecified: Secondary | ICD-10-CM

## 2021-08-21 MED ORDER — ATENOLOL 50 MG PO TABS: 50.0000 mg | ORAL_TABLET | Freq: Every morning | ORAL | 3 refills | Status: DC

## 2021-08-21 NOTE — Assessment & Plan Note (Signed)
BP Readings from Last 1 Encounters:  08/21/21 136/78   Well-controlled with Atenolol (due to h/o tremors) Counseled for compliance with the medications Advised DASH diet and moderate exercise/walking as tolerated

## 2021-08-21 NOTE — Assessment & Plan Note (Signed)
Lipid profile reviewed If  confirmed PVD, will add statin

## 2021-08-21 NOTE — Assessment & Plan Note (Signed)
Well-controlled On Trelegy and PRN Albuterol nebs and inhaler Quit smoking On Singulair 10 mg QD Followed by Pulmology - planned for PFT

## 2021-08-21 NOTE — Assessment & Plan Note (Signed)
Cold sensation in b/l legs and weak DPA pulse Referred to Vascular surgery for further eval

## 2021-08-21 NOTE — Assessment & Plan Note (Signed)
Lab Results  Component Value Date   TSH 5.120 (H) 08/15/2021   Recheck TSH and free T4

## 2021-08-21 NOTE — Progress Notes (Signed)
Established Patient Office Visit  Subjective:  Patient ID: Martin Ford, male    DOB: February 28, 1936  Age: 85 y.o. MRN: [id number]  CC:  Chief Complaint  Patient presents with   Follow-up    4 month follow up bottom part of both legs is much colder than top     HPI Martin Ford is a 85 y.o. male with past medical history of COPD, HTN and essential tremors who presents for f/u of his chronic medical conditions.  HTN: BP is well-controlled. Takes medications regularly. Patient denies headache, dizziness, chest pain, or palpitations.   COPD: He c/o chronic dyspnea, worse with exertion. He uses Trelegy and PRN Albuterol nebs and inhaler. He follows up with Dr Halford Chessman now and is going to get PFT done in the next week. Marland Kitchen He also has chronic cough, but denies any recent fever, chills, change in sputum production or hemoptysis. Denies any recent weight loss.  He c/o cold sensation of b/l legs, more compared to thigh area. He denies any numbness or tingling of LE. Denies any recent immobilization. Denies any recent falls.  Blood tests were reviewed and discussed with the patient in detail.   Past Medical History:  Diagnosis Date   Emphysema of lung ([place])    Essential tremor    Hypertension     History reviewed. No pertinent surgical history.  Family History  Problem Relation Age of Onset   Lung disease Paternal Grandfather     Social History   Socioeconomic History   Marital status: Married    Spouse name: Not on file   Number of children: Not on file   Years of education: Not on file   Highest education level: Not on file  Occupational History   Not on file  Tobacco Use   Smoking status: Former    Types: Cigarettes   Smokeless tobacco: Never  Substance and Sexual Activity   Alcohol use: Yes    Alcohol/week: 2.0 standard drinks    Types: 2 Cans of beer per week   Drug use: Never   Sexual activity: Not on file  Other Topics Concern   Not on file  Social History  Narrative   Not on file   Social Determinants of Health   Financial Resource Strain: Low Risk    Difficulty of Paying Living Expenses: Not hard at all  Food Insecurity: No Food Insecurity   Worried About Charity fundraiser in the Last Year: Never true   [place] in the Last Year: Never true  Transportation Needs: No Transportation Needs   Lack of Transportation (Medical): No   Lack of Transportation (Non-Medical): No  Physical Activity: Inactive   Days of Exercise per Week: 0 days   Minutes of Exercise per Session: 0 min  Stress: No Stress Concern Present   Feeling of Stress : Not at all  Social Connections: Moderately Isolated   Frequency of Communication with Friends and Family: Never   Frequency of Social Gatherings with Friends and Family: Twice a week   Attends Religious Services: Never   Marine scientist or Organizations: Yes   Attends Music therapist: 1 to 4 times per year   Marital Status: Married  Human resources officer Violence: Not At Risk   Fear of Current or Ex-Partner: No   Emotionally Abused: No   Physically Abused: No   Sexually Abused: No    Outpatient Medications Prior to Visit  Medication Sig Dispense Refill  albuterol (PROVENTIL) (2.5 MG/3ML) 0.083% nebulizer solution SMARTSIG:1 Vial(s) Via Nebulizer 4 Times Daily PRN     albuterol (VENTOLIN HFA) 108 (90 Base) MCG/ACT inhaler Inhale 2 puffs into the lungs every 6 (six) hours as needed for wheezing or shortness of breath. 8 g 0   Cholecalciferol (VITAMIN D3 ULTRA STRENGTH) 125 MCG (5000 UT) capsule Take 5,000 Units by mouth daily.     montelukast (SINGULAIR) 10 MG tablet Take 1 tablet (10 mg total) by mouth at bedtime. 90 tablet 3   TRELEGY ELLIPTA 100-62.5-25 MCG/INH AEPB Inhale 1 puff into the lungs daily.     atenolol (TENORMIN) 50 MG tablet Take 50 mg by mouth every morning.     No facility-administered medications prior to visit.    No Known Allergies  ROS Review of Systems   Constitutional:  Negative for chills and fever.  HENT:  Negative for congestion and sore throat.   Eyes:  Negative for pain and discharge.  Respiratory:  Positive for cough and shortness of breath.   Cardiovascular:  Negative for chest pain and palpitations.  Gastrointestinal:  Negative for diarrhea, nausea and vomiting.  Endocrine: Negative for polydipsia and polyuria.  Genitourinary:  Negative for dysuria and hematuria.  Musculoskeletal:  Negative for neck pain and neck stiffness.  Skin:  Negative for rash.  Neurological:  Negative for dizziness, weakness, numbness and headaches.  Psychiatric/Behavioral:  Negative for agitation and behavioral problems.      Objective:    Physical Exam Vitals reviewed.  Constitutional:      General: He is not in acute distress.    Appearance: He is not diaphoretic.  HENT:     Head: Normocephalic and atraumatic.     Nose: Nose normal.     Mouth/Throat:     Mouth: Mucous membranes are moist.  Eyes:     General: No scleral icterus.    Extraocular Movements: Extraocular movements intact.  Cardiovascular:     Rate and Rhythm: Normal rate and regular rhythm.     Pulses:          Dorsalis pedis pulses are 1+ on the right side and 1+ on the left side.     Heart sounds: Normal heart sounds. No murmur heard. Pulmonary:     Breath sounds: No wheezing or rales.     Comments: Decreased breath sounds at b/l lung bases Musculoskeletal:     Cervical back: Neck supple. No tenderness.     Right lower leg: No edema.     Left lower leg: No edema.  Skin:    General: Skin is warm.     Findings: No erythema.  Neurological:     General: No focal deficit present.     Mental Status: He is alert and oriented to person, place, and time.  Psychiatric:        Mood and Affect: Mood normal.        Behavior: Behavior normal.    BP 136/78 (BP Location: Left Arm, Patient Position: Sitting, Cuff Size: Normal)    Pulse 66    Resp 18    Ht _0  (1.803 m)    Wt 186  lb 0.6 oz (84.4 kg)    SpO2 96%    BMI 25.95 kg/m  Wt Readings from Last 3 Encounters:  08/21/21 186 lb 0.6 oz (84.4 kg)  07/18/21 182 lb (82.6 kg)  05/23/21 176 lb 0.6 oz (79.9 kg)    Lab Results  Component Value Date   TSH 5.120 (  H) 08/15/2021   Lab Results  Component Value Date   WBC 7.6 08/15/2021   HGB 15.4 08/15/2021   HCT 46.0 08/15/2021   MCV 90 08/15/2021   PLT 356 08/15/2021   Lab Results  Component Value Date   NA 139 08/15/2021   K 5.0 08/15/2021   CO2 24 08/15/2021   GLUCOSE 123 (H) 08/15/2021   BUN 18 08/15/2021   CREATININE 1.01 08/15/2021   BILITOT 1.1 08/15/2021   ALKPHOS 88 08/15/2021   AST 22 08/15/2021   ALT 16 08/15/2021   PROT 7.2 08/15/2021   ALBUMIN 4.6 08/15/2021   CALCIUM 9.9 08/15/2021   EGFR 74 08/15/2021   Lab Results  Component Value Date   CHOL 241 (H) 08/15/2021   Lab Results  Component Value Date   HDL 57 08/15/2021   Lab Results  Component Value Date   LDLCALC 160 (H) 08/15/2021   Lab Results  Component Value Date   TRIG 132 08/15/2021   Lab Results  Component Value Date   CHOLHDL 4.2 08/15/2021   No results found for: HGBA1C    Assessment & Plan:   Problem List Items Addressed This Visit       Cardiovascular and Mediastinum   Essential hypertension - Primary    BP Readings from Last 1 Encounters:  08/21/21 136/78  Well-controlled with Atenolol (due to h/o tremors) Counseled for compliance with the medications Advised DASH diet and moderate exercise/walking as tolerated       Relevant Medications   atenolol (TENORMIN) 50 MG tablet   PVD (peripheral vascular disease) (HCC)    Cold sensation in b/l legs and weak DPA pulse Referred to Vascular surgery for further eval      Relevant Medications   atenolol (TENORMIN) 50 MG tablet   Other Relevant Orders   Ambulatory referral to Vascular Surgery     Respiratory   Centrilobular emphysema (HCC)    Well-controlled On Trelegy and PRN Albuterol nebs and  inhaler Quit smoking On Singulair 10 mg QD Followed by Pulmology - planned for PFT        Endocrine   Subclinical hypothyroidism    Lab Results  Component Value Date   TSH 5.120 (H) 08/15/2021  Recheck TSH and free T4      Relevant Medications   atenolol (TENORMIN) 50 MG tablet   Other Relevant Orders   TSH + free T4     Other   Mixed hyperlipidemia    Lipid profile reviewed If  confirmed PVD, will add statin      Relevant Medications   atenolol (TENORMIN) 50 MG tablet   Prediabetes    Check HbA1C Advised to continue to follow low carb diet      Relevant Orders   Hemoglobin A1c    Meds ordered this encounter  Medications   atenolol (TENORMIN) 50 MG tablet    Sig: Take 1 tablet (50 mg total) by mouth every morning.    Dispense:  90 tablet    Refill:  3    Follow-up: Return in about 4 months (around 12/20/2021) for HTN and PVD.    Lindell Spar, MD

## 2021-08-21 NOTE — Assessment & Plan Note (Signed)
Check HbA1C Advised to continue to follow low carb diet

## 2021-08-21 NOTE — Patient Instructions (Signed)
You are being referred to Vascular surgery.  Please continue taking medications as prescribed.  Please avoid cold exposure. Sit in a recliner chair to keep legs elevated.  Please get blood tests done after 2 weeks.

## 2021-08-22 ENCOUNTER — Telehealth: Payer: Self-pay | Admitting: Pulmonary Disease

## 2021-08-22 DIAGNOSIS — G4734 Idiopathic sleep related nonobstructive alveolar hypoventilation: Secondary | ICD-10-CM

## 2021-08-22 NOTE — Telephone Encounter (Signed)
ONO with RA 08/15/21 >> test time 7 hrs 20 min.  Mean SpO2 92.41%, low SpO2 81%.  Spent 10 min with SpO2 < 88%.  Please let him know his overnight oximetry showed low oxygen levels at night.  He needs to be set up with 2 liters oxygen with sleep.

## 2021-08-23 NOTE — Telephone Encounter (Signed)
Called and went over results with patients wife -ok per dpr-  She had questions regarding why he needed oxygen and if he would need it all of the time. Answered her questions and let her know the oxygen is only needed at night time. She voiced understanding. Advised wife to call us back if her or the husband had any further questions. Order placed.

## 2021-08-24 NOTE — Telephone Encounter (Signed)
See phone note from 12/20 for ONO results. Will close encounter.

## 2021-08-31 ENCOUNTER — Other Ambulatory Visit: Payer: Self-pay

## 2021-08-31 ENCOUNTER — Encounter: Payer: Self-pay | Admitting: Primary Care

## 2021-08-31 ENCOUNTER — Encounter: Payer: Medicare Other | Admitting: Primary Care

## 2021-08-31 ENCOUNTER — Other Ambulatory Visit: Payer: Self-pay | Admitting: *Deleted

## 2021-08-31 ENCOUNTER — Ambulatory Visit (INDEPENDENT_AMBULATORY_CARE_PROVIDER_SITE_OTHER): Payer: Medicare Other | Admitting: Pulmonary Disease

## 2021-08-31 DIAGNOSIS — J449 Chronic obstructive pulmonary disease, unspecified: Secondary | ICD-10-CM | POA: Diagnosis not present

## 2021-08-31 DIAGNOSIS — I739 Peripheral vascular disease, unspecified: Secondary | ICD-10-CM

## 2021-08-31 NOTE — Progress Notes (Signed)
PFT done today. 

## 2021-08-31 NOTE — Progress Notes (Signed)
 Err   This encounter was created in error - please disregard.

## 2021-09-01 LAB — PULMONARY FUNCTION TEST
DL/VA % pred: 83 %
DL/VA: 3.25 ml/min/mmHg/L
DLCO cor % pred: 67 %
DLCO cor: 15.14 ml/min/mmHg
DLCO unc % pred: 68 %
DLCO unc: 15.47 ml/min/mmHg
FEF 25-75 Post: 0.42 L/sec
FEF 25-75 Pre: 0.28 L/sec
FEF2575-%Change-Post: 49 %
FEF2575-%Pred-Post: 25 %
FEF2575-%Pred-Pre: 17 %
FEV1-%Change-Post: 21 %
FEV1-%Pred-Post: 38 %
FEV1-%Pred-Pre: 31 %
FEV1-Post: 0.95 L
FEV1-Pre: 0.78 L
FEV1FVC-%Change-Post: 14 %
FEV1FVC-%Pred-Pre: 59 %
FEV6-%Change-Post: 5 %
FEV6-%Pred-Post: 55 %
FEV6-%Pred-Pre: 52 %
FEV6-Post: 1.84 L
FEV6-Pre: 1.74 L
FEV6FVC-%Change-Post: -1 %
FEV6FVC-%Pred-Post: 100 %
FEV6FVC-%Pred-Pre: 101 %
FVC-%Change-Post: 6 %
FVC-%Pred-Post: 55 %
FVC-%Pred-Pre: 52 %
FVC-Post: 1.98 L
FVC-Pre: 1.86 L
Post FEV1/FVC ratio: 48 %
Post FEV6/FVC ratio: 93 %
Pre FEV1/FVC ratio: 42 %
Pre FEV6/FVC Ratio: 94 %
RV % pred: 179 %
RV: 4.69 L
TLC % pred: 103 %
TLC: 6.91 L

## 2021-09-06 ENCOUNTER — Encounter: Payer: Self-pay | Admitting: Vascular Surgery

## 2021-09-06 ENCOUNTER — Other Ambulatory Visit: Payer: Self-pay

## 2021-09-06 ENCOUNTER — Encounter: Payer: Self-pay | Admitting: Primary Care

## 2021-09-06 ENCOUNTER — Telehealth: Payer: Medicare Other | Admitting: Primary Care

## 2021-09-06 ENCOUNTER — Ambulatory Visit: Payer: Medicare Other

## 2021-09-06 ENCOUNTER — Ambulatory Visit: Payer: Medicare Other | Admitting: Vascular Surgery

## 2021-09-06 VITALS — BP 152/68 | HR 61 | Temp 98.4°F | Wt 187.1 lb

## 2021-09-06 DIAGNOSIS — J449 Chronic obstructive pulmonary disease, unspecified: Secondary | ICD-10-CM | POA: Insufficient documentation

## 2021-09-06 DIAGNOSIS — I739 Peripheral vascular disease, unspecified: Secondary | ICD-10-CM

## 2021-09-06 MED ORDER — TRELEGY ELLIPTA 200-62.5-25 MCG/ACT IN AEPB: 1.0000 | INHALATION_SPRAY | Freq: Every day | RESPIRATORY_TRACT | 11 refills | Status: DC

## 2021-09-06 NOTE — Progress Notes (Signed)
Virtual Visit via Video Note  I connected with Martin Ford on 09/06/21 at  3:30 PM EST by a video enabled telemedicine application and verified that I am speaking with the correct person using two identifiers.  Location: Patient: Home Provider: Office   I discussed the limitations of evaluation and management by telemedicine and the availability of in person appointments. The patient expressed understanding and agreed to proceed.  History of Present Illness: 86 year old male, former smoker. PMH significant for COPD. Patient of Dr. Craige Cotta, last seen for consult on 07/18/21.    Previous [facility] pulmonary encounter: 07/18/21- Dr. Craige Cotta, Consult He is here with his wife.  He is from [place], but has lived in [place] for about 30 years.  He smoked 3 ppd and quit in the 1970's.  He worked in Production designer, theatre/television/film.  Never in the [employer].  No history of pneumonia or TB.  No animal/bird exposures.  His grandfather had black lung disease from working in coal mines.    He was told years ago that he has emphysema.  He has not had recent PFT or chest xray.  He gets intermittent cough with clear sputum and wheezing.  Denies chest pain or hemoptysis.  Gets cramps in his legs while sleeping.  Gets winded after walking about 100 feet.  Not having leg swelling.  No history of thromboembolic disease.  He was recently started on trelegy.  This helps, but hasn't been using consistently.  Has to use albuterol few times per day, and this helps.  Recently started on singulair, and his wife feels his breathing has improved since this was started especially his breathing at night.  Ambulatory oximetry on room air today: SpO2 low 96%, HR peak 82.  09/06/2021- Interim hx  Patient presents today for 6 week follow-up with PFTs. Maintained on Trelegy one puff daily, singulair and prn albuterol hfa/neb. PFTs on 08/31/21 showed severe obstructive defect with postive BD response and mild diffusion defect. Additionally he  had an overnight oximetry testing in November that showed he spent 10 mins with SpO2 <88% and was recommended he be started on 2L oxygen at night.   His breathing is fine at rest. He has a chronic cough with intermittent wheezing. Cough is productive with clear mucus. He gets very breathless walking up a hill or flight of stairs. He is compliant with Trelegy daily. Uses his albuterol hfa 5 times a week on average. He has been wearing 2L oxygen at night, requesting portable concentrator for travel. He leaves for [place] for the winter tomorrow and will be there until April.   Testing:  ONO with RA 08/15/21 >> test time 7 hrs 20 min.  Mean SpO2 92.41%, low SpO2 81%. Spent 10 min with SpO2 < 88%.  Imaging: 07/19/21 CXR>> Hyperinflation suggests emphysema. No active cardiopulmonary disease.  Observations/Objective:  - Appears well; No acute respiratory complaints  Assessment and Plan:  Severe COPD: - Former smoker. Patient has chronic dyspnea and cough symptoms. CAT score 19. PFTs 08/31/21 showed severe obstructive airway disease with positive BD response and mild diffusion defect.  Recommend increasing Trelegy to one puff daily (prescription [facility]). FU in 3-4 months with Dr. Craige Cotta.   Nocturnal hypoxia: - ONO with RA 08/15/21 >> test time 7 hrs 20 min.  Mean SpO2 92.41%, low SpO2 81%. Spent 10 min with SpO2 < 88%  - Continue 2L oxygen at night - Advised he contact DME company about getting travel concentrator or  he can look into Garment/textile technologist with [employer]   Follow Up Instructions:  - 3-4 months with Dr. Craige Cotta or sooner if needed    I discussed the assessment and treatment plan with the patient. The patient was provided an opportunity to ask questions and all were answered. The patient agreed with the plan and demonstrated an understanding of the instructions.   The patient was advised to call back or seek an in-person evaluation if the symptoms worsen  or if the condition fails to improve as anticipated.  I provided 22 minutes of non-face-to-face time during this encounter.   Glenford Bayley, NP

## 2021-09-06 NOTE — Progress Notes (Signed)
Reviewed and agree with assessment/plan.   Coralyn Helling, MD [facility] Pulmonary/Critical Care 09/06/2021, 4:13 PM Pager:  [phone]

## 2021-09-06 NOTE — Progress Notes (Signed)
[facility] [place]  Patient name: Martin Ford MRN: [id number] DOB: 07-20-1936 Sex: male  REASON FOR CONSULT: Evaluation cool sensation bilateral lower extremities  HPI: Martin Ford is a 86 y.o. male, who is here today for evaluation.  He is here with his wife.  He notes sensation of coolness in both lower extremities from his knees distally into his feet.  He does not have any claudication type symptoms or arterial rest pain.  He does not have any similar coolness in his hands.  He has no history of lower extremity tissue loss.  He walks with a cane and his main complaint appears to be more arthritic with a stiff right ankle and also balance issues.  He has emphysema and becomes shortness of breath with minimal walking.  Past Medical History:  Diagnosis Date   Emphysema of lung ([place])    Essential tremor    Hypertension     Family History  Problem Relation Age of Onset   Lung disease Paternal Grandfather     SOCIAL HISTORY: Social History   Socioeconomic History   Marital status: Married    Spouse name: Not on file   Number of children: Not on file   Years of education: Not on file   Highest education level: Not on file  Occupational History   Not on file  Tobacco Use   Smoking status: Former    Types: Cigarettes   Smokeless tobacco: Never  Substance and Sexual Activity   Alcohol use: Yes    Alcohol/week: 2.0 standard drinks    Types: 2 Cans of beer per week   Drug use: Never   Sexual activity: Not on file  Other Topics Concern   Not on file  Social History Narrative   Not on file   Social Determinants of Health   Financial Resource Strain: Low Risk    Difficulty of Paying Living Expenses: Not hard at all  Food Insecurity: No Food Insecurity   Worried About Charity fundraiser in the Last Year: Never true   [place] in the Last Year: Never true  Transportation Needs: No Transportation Needs    Lack of Transportation (Medical): No   Lack of Transportation (Non-Medical): No  Physical Activity: Inactive   Days of Exercise per Week: 0 days   Minutes of Exercise per Session: 0 min  Stress: No Stress Concern Present   Feeling of Stress : Not at all  Social Connections: Moderately Isolated   Frequency of Communication with Friends and Family: Never   Frequency of Social Gatherings with Friends and Family: Twice a week   Attends Religious Services: Never   Marine scientist or Organizations: Yes   Attends Music therapist: 1 to 4 times per year   Marital Status: Married  Human resources officer Violence: Not At Risk   Fear of Current or Ex-Partner: No   Emotionally Abused: No   Physically Abused: No   Sexually Abused: No    No Known Allergies  Current Outpatient Medications  Medication Sig Dispense Refill   albuterol (PROVENTIL) (2.5 MG/3ML) 0.083% nebulizer solution SMARTSIG:1 Vial(s) Via Nebulizer 4 Times Daily PRN     albuterol (VENTOLIN HFA) 108 (90 Base) MCG/ACT inhaler Inhale 2 puffs into the lungs every 6 (six) hours as needed for wheezing or shortness of breath. 8 g 0   atenolol (TENORMIN) 50 MG tablet Take 1 tablet (50 mg total) by mouth every morning. 90 tablet  3   Cholecalciferol (VITAMIN D3 ULTRA STRENGTH) 125 MCG (5000 UT) capsule Take 5,000 Units by mouth daily.     montelukast (SINGULAIR) 10 MG tablet Take 1 tablet (10 mg total) by mouth at bedtime. 90 tablet 3   TRELEGY ELLIPTA 100-62.5-25 MCG/INH AEPB Inhale 1 puff into the lungs daily.     No current facility-administered medications for this visit.    REVIEW OF SYSTEMS:  [X]  denotes positive finding, [ ]  denotes negative finding Cardiac  Comments:  Chest pain or chest pressure:    Shortness of breath upon exertion:    Short of breath when lying flat:    Irregular heart rhythm:        Vascular    Pain in calf, thigh, or hip brought on by ambulation:    Pain in feet at night that wakes  you up from your sleep:     Blood clot in your veins:    Leg swelling:         Pulmonary    Oxygen at home: x   Productive cough:     Wheezing:         Neurologic    Sudden weakness in arms or legs:     Sudden numbness in arms or legs:     Sudden onset of difficulty speaking or slurred speech:    Temporary loss of vision in one eye:     Problems with dizziness:         Gastrointestinal    Blood in stool:     Vomited blood:         Genitourinary    Burning when urinating:     Blood in urine:        Psychiatric    Major depression:         Hematologic    Bleeding problems:    Problems with blood clotting too easily:        Skin    Rashes or ulcers:        Constitutional    Fever or chills:      PHYSICAL EXAM: Vitals:   09/06/21 1032  BP: (!) 152/68  Pulse: 61  Temp: 98.4 F (36.9 C)  TempSrc: Temporal  SpO2: 94%  Weight: 187 lb 2 oz (84.9 kg)    GENERAL: The patient is a well-nourished male, in no acute distress. The vital signs are documented above. CARDIOVASCULAR: 2+ radial pulses bilaterally.  2+ femoral pulses bilaterally.  2+ popliteal pulses bilaterally.  I do not palpate posterior tibial or dorsalis pedis pulses bilaterally. PULMONARY: There is good air exchange  MUSCULOSKELETAL: There are no major deformities or cyanosis. NEUROLOGIC: No focal weakness or paresthesias are detected. SKIN: There are no ulcers or rashes noted. PSYCHIATRIC: The patient has a normal affect.  DATA:  Noninvasive studies in our office today reveal ankle arm index of 0.72 on the right and 0.70 on the left.  He has an audible flow in the posterior tibial arteries bilaterally  MEDICAL ISSUES: I discussed these findings with the patient.  He does have evidence of tibial disease bilaterally with normal flow to the popliteal level bilaterally.  He does not have any claudication or arterial rest pain symptoms.  His main concern is the coolness in his lower extremities.  I explained  that this may or may not be related to the tibial disease but certainly would not recommend any further evaluation or treatment based on this alone.  He was reassured with this discussion.  I did explain the importance of seeking attention should he develop any difficulty heal wounds on his feet.  Otherwise he will see [place] again on an as-needed basis   Rosetta Posner, MD [facility] [place] [facility] Tel [phone] Pager [phone]  Note: Portions of this report may have been transcribed using voice recognition software.  Every effort has been made to ensure accuracy; however, inadvertent computerized transcription errors may still be present.

## 2021-09-06 NOTE — Assessment & Plan Note (Signed)
-   Former smoker. Patient has chronic dyspnea and cough symptoms. CAT score 19/ PFTs 08/31/21 showed severe obstructive airway disease with positive BD response and mild diffusion defect.  Recommend changing Trelegy to one puff daily (prescription [facility]). FU in 3-4 months with Dr. Craige Cotta.

## 2021-09-07 DIAGNOSIS — J449 Chronic obstructive pulmonary disease, unspecified: Secondary | ICD-10-CM

## 2021-09-07 DIAGNOSIS — G4734 Idiopathic sleep related nonobstructive alveolar hypoventilation: Secondary | ICD-10-CM

## 2021-09-07 NOTE — Telephone Encounter (Signed)
Beth, please advise on pt's email. He is wanting to buy an oxygen concentrator from a company in [facility] as he will be there for several weeks. Pt just needs a script.   Please advise if ok to send script to [place] [facility] to provide pt a oxygen concentrator. Thanks.

## 2021-09-08 NOTE — Telephone Encounter (Signed)
Yes that is fine, he needs 2L continuous oxygen at night

## 2021-09-12 NOTE — Telephone Encounter (Signed)
Orders placed per Geraldo Pitter.

## 2021-09-24 DIAGNOSIS — J449 Chronic obstructive pulmonary disease, unspecified: Secondary | ICD-10-CM | POA: Diagnosis not present

## 2021-10-25 DIAGNOSIS — J449 Chronic obstructive pulmonary disease, unspecified: Secondary | ICD-10-CM | POA: Diagnosis not present

## 2021-11-22 DIAGNOSIS — J449 Chronic obstructive pulmonary disease, unspecified: Secondary | ICD-10-CM | POA: Diagnosis not present

## 2021-12-14 DIAGNOSIS — E038 Other specified hypothyroidism: Secondary | ICD-10-CM | POA: Diagnosis not present

## 2021-12-14 DIAGNOSIS — R7303 Prediabetes: Secondary | ICD-10-CM | POA: Diagnosis not present

## 2021-12-15 LAB — HEMOGLOBIN A1C
Est. average glucose Bld gHb Est-mCnc: 105 mg/dL
Hgb A1c MFr Bld: 5.3 % (ref 4.8–5.6)

## 2021-12-15 LAB — TSH+FREE T4
Free T4: 0.97 ng/dL (ref 0.82–1.77)
TSH: 7.93 u[IU]/mL — ABNORMAL HIGH (ref 0.450–4.500)

## 2021-12-20 ENCOUNTER — Ambulatory Visit (INDEPENDENT_AMBULATORY_CARE_PROVIDER_SITE_OTHER): Payer: Medicare Other | Admitting: Internal Medicine

## 2021-12-20 ENCOUNTER — Encounter: Payer: Self-pay | Admitting: Internal Medicine

## 2021-12-20 VITALS — BP 138/82 | HR 65 | Resp 18 | Ht 71.0 in | Wt 185.0 lb

## 2021-12-20 DIAGNOSIS — I10 Essential (primary) hypertension: Secondary | ICD-10-CM | POA: Diagnosis not present

## 2021-12-20 DIAGNOSIS — J432 Centrilobular emphysema: Secondary | ICD-10-CM | POA: Diagnosis not present

## 2021-12-20 DIAGNOSIS — R0609 Other forms of dyspnea: Secondary | ICD-10-CM | POA: Diagnosis not present

## 2021-12-20 DIAGNOSIS — J9611 Chronic respiratory failure with hypoxia: Secondary | ICD-10-CM | POA: Diagnosis not present

## 2021-12-20 DIAGNOSIS — E039 Hypothyroidism, unspecified: Secondary | ICD-10-CM

## 2021-12-20 DIAGNOSIS — I739 Peripheral vascular disease, unspecified: Secondary | ICD-10-CM

## 2021-12-20 DIAGNOSIS — E782 Mixed hyperlipidemia: Secondary | ICD-10-CM

## 2021-12-20 MED ORDER — ROSUVASTATIN CALCIUM 10 MG PO TABS: 10.0000 mg | ORAL_TABLET | Freq: Every evening | ORAL | 1 refills | Status: DC

## 2021-12-20 MED ORDER — LEVOTHYROXINE SODIUM 25 MCG PO TABS
25.0000 ug | ORAL_TABLET | Freq: Every day | ORAL | 1 refills | Status: DC
Start: 2021-12-20 — End: 2022-02-27

## 2021-12-20 NOTE — Patient Instructions (Addendum)
Please start taking Rosuvastatin for cholesterol. ? ?Please start taking Levothyroxine as prescribed in the morning on empty stomach. ? ?Please use Nebulizer treatment at least twice daily for next 1 week. ? ?You are being scheduled to get Echocardiogram. ? ?Please get blood tests done before the next visit. ?

## 2021-12-20 NOTE — Assessment & Plan Note (Signed)
Lipid profile reviewed ?Due to PVD, added statin ?

## 2021-12-20 NOTE — Progress Notes (Signed)
? ?Established Patient Office Visit ? ?Subjective:  ?Patient ID: Martin Ford, male    DOB: 05/20/1936  Age: 86 y.o. MRN: [id number] ? ?CC:  ?Chief Complaint  ?Patient presents with  ? Follow-up  ?  4 month follow up HTN and PVD pt gets sob when doing anything   ? ? ?HPI ?Martin Ford is a 86 y.o. male with past medical history of COPD, HTN and essential tremors who presents for f/u of his chronic medical conditions. ? ?COPD: He has been using Trelegy and as needed albuterol inhaler for dyspnea.  He has also started using O2 at nighttime.  He reports exertional dyspnea even with minimal activity at home.  He currently denies any LE swelling, orthopnea or PND.  Denies any fever or chills currently. ? ?PVD: Was evaluated by vascular surgery.  Was not started on any new medications.  He reports that he is cold sensation of LE has improved recently. ? ?HTN: BP is well-controlled. Takes medications regularly. Patient denies headache, dizziness, chest pain, or palpitations. ? ? ?Past Medical History:  ?Diagnosis Date  ? Emphysema of lung ([place])   ? Essential tremor   ? Hypertension   ? ? ?History reviewed. No pertinent surgical history. ? ?Family History  ?Problem Relation Age of Onset  ? Lung disease Paternal Grandfather   ? ? ?Social History  ? ?Socioeconomic History  ? Marital status: Married  ?  Spouse name: Not on file  ? Number of children: Not on file  ? Years of education: Not on file  ? Highest education level: Not on file  ?Occupational History  ? Not on file  ?Tobacco Use  ? Smoking status: Former  ?  Types: Cigarettes  ? Smokeless tobacco: Never  ?Substance and Sexual Activity  ? Alcohol use: Yes  ?  Alcohol/week: 2.0 standard drinks  ?  Types: 2 Cans of beer per week  ? Drug use: Never  ? Sexual activity: Not on file  ?Other Topics Concern  ? Not on file  ?Social History Narrative  ? Not on file  ? ?Social Determinants of Health  ? ?Financial Resource Strain: Low Risk   ? Difficulty of Paying Living  Expenses: Not hard at all  ?Food Insecurity: No Food Insecurity  ? Worried About Charity fundraiser in the Last Year: Never true  ? Ran Out of Food in the Last Year: Never true  ?Transportation Needs: No Transportation Needs  ? Lack of Transportation (Medical): No  ? Lack of Transportation (Non-Medical): No  ?Physical Activity: Inactive  ? Days of Exercise per Week: 0 days  ? Minutes of Exercise per Session: 0 min  ?Stress: No Stress Concern Present  ? Feeling of Stress : Not at all  ?Social Connections: Moderately Isolated  ? Frequency of Communication with Friends and Family: Never  ? Frequency of Social Gatherings with Friends and Family: Twice a week  ? Attends Religious Services: Never  ? Active Member of Clubs or Organizations: Yes  ? Attends Archivist Meetings: 1 to 4 times per year  ? Marital Status: Married  ?Intimate Partner Violence: Not At Risk  ? Fear of Current or Ex-Partner: No  ? Emotionally Abused: No  ? Physically Abused: No  ? Sexually Abused: No  ? ? ?Outpatient Medications Prior to Visit  ?Medication Sig Dispense Refill  ? albuterol (PROVENTIL) (2.5 MG/3ML) 0.083% nebulizer solution SMARTSIG:1 Vial(s) Via Nebulizer 4 Times Daily PRN    ? albuterol (VENTOLIN HFA) 108 (  90 Base) MCG/ACT inhaler Inhale 2 puffs into the lungs every 6 (six) hours as needed for wheezing or shortness of breath. 8 g 0  ? atenolol (TENORMIN) 50 MG tablet Take 1 tablet (50 mg total) by mouth every morning. 90 tablet 3  ? Cholecalciferol (VITAMIN D3 ULTRA STRENGTH) 125 MCG (5000 UT) capsule Take 5,000 Units by mouth daily.    ? Fluticasone-Umeclidin-Vilant (TRELEGY ELLIPTA) 200-62.5-25 MCG/ACT AEPB Inhale 1 puff into the lungs daily. 60 each 11  ? montelukast (SINGULAIR) 10 MG tablet Take 1 tablet (10 mg total) by mouth at bedtime. 90 tablet 3  ? ?No facility-administered medications prior to visit.  ? ? ?No Known Allergies ? ?ROS ?Review of Systems  ?Constitutional:  Negative for chills and fever.  ?HENT:   Negative for congestion and sore throat.   ?Eyes:  Negative for pain and discharge.  ?Respiratory:  Positive for cough and shortness of breath.   ?Cardiovascular:  Negative for chest pain and palpitations.  ?Gastrointestinal:  Negative for diarrhea, nausea and vomiting.  ?Endocrine: Negative for polydipsia and polyuria.  ?Genitourinary:  Negative for dysuria and hematuria.  ?Musculoskeletal:  Negative for neck pain and neck stiffness.  ?Skin:  Negative for rash.  ?Neurological:  Negative for dizziness, weakness, numbness and headaches.  ?Psychiatric/Behavioral:  Negative for agitation and behavioral problems.   ? ?  ?Objective:  ?  ?Physical Exam ?Vitals reviewed.  ?Constitutional:   ?   General: He is not in acute distress. ?   Appearance: He is not diaphoretic.  ?HENT:  ?   Head: Normocephalic and atraumatic.  ?   Nose: Nose normal.  ?   Mouth/Throat:  ?   Mouth: Mucous membranes are moist.  ?Eyes:  ?   General: No scleral icterus. ?   Extraocular Movements: Extraocular movements intact.  ?Cardiovascular:  ?   Rate and Rhythm: Normal rate and regular rhythm.  ?   Pulses:     ?     Dorsalis pedis pulses are 1+ on the right side and 1+ on the left side.  ?   Heart sounds: Normal heart sounds. No murmur heard. ?Pulmonary:  ?   Breath sounds: No wheezing or rales.  ?   Comments: Decreased breath sounds at b/l lung bases ?Musculoskeletal:  ?   Cervical back: Neck supple. No tenderness.  ?   Right lower leg: No edema.  ?   Left lower leg: No edema.  ?Skin: ?   General: Skin is warm.  ?   Findings: No erythema.  ?Neurological:  ?   General: No focal deficit present.  ?   Mental Status: He is alert and oriented to person, place, and time.  ?Psychiatric:     ?   Mood and Affect: Mood normal.     ?   Behavior: Behavior normal.  ? ? ?BP 138/82 (BP Location: Left Arm, Patient Position: Sitting, Cuff Size: Normal)   Pulse 65   Resp 18   Ht _0  (1.803 m)   Wt 185 lb (83.9 kg)   SpO2 93%   BMI 25.80 kg/m?  ?Wt Readings  from Last 3 Encounters:  ?12/20/21 185 lb (83.9 kg)  ?09/06/21 187 lb 2 oz (84.9 kg)  ?08/31/21 185 lb 9.6 oz (84.2 kg)  ? ? ?Lab Results  ?Component Value Date  ? TSH 7.930 (H) 12/14/2021  ? ?Lab Results  ?Component Value Date  ? WBC 7.6 08/15/2021  ? HGB 15.4 08/15/2021  ? HCT 46.0 08/15/2021  ?  MCV 90 08/15/2021  ? PLT 356 08/15/2021  ? ?Lab Results  ?Component Value Date  ? NA 139 08/15/2021  ? K 5.0 08/15/2021  ? CO2 24 08/15/2021  ? GLUCOSE 123 (H) 08/15/2021  ? BUN 18 08/15/2021  ? CREATININE 1.01 08/15/2021  ? BILITOT 1.1 08/15/2021  ? ALKPHOS 88 08/15/2021  ? AST 22 08/15/2021  ? ALT 16 08/15/2021  ? PROT 7.2 08/15/2021  ? ALBUMIN 4.6 08/15/2021  ? CALCIUM 9.9 08/15/2021  ? EGFR 74 08/15/2021  ? ?Lab Results  ?Component Value Date  ? CHOL 241 (H) 08/15/2021  ? ?Lab Results  ?Component Value Date  ? HDL 57 08/15/2021  ? ?Lab Results  ?Component Value Date  ? LDLCALC 160 (H) 08/15/2021  ? ?Lab Results  ?Component Value Date  ? TRIG 132 08/15/2021  ? ?Lab Results  ?Component Value Date  ? CHOLHDL 4.2 08/15/2021  ? ?Lab Results  ?Component Value Date  ? HGBA1C 5.3 12/14/2021  ? ? ?  ?Assessment & Plan:  ? ?Problem List Items Addressed This Visit   ? ?  ? Cardiovascular and Mediastinum  ? Essential hypertension  ?  BP Readings from Last 1 Encounters:  ?12/20/21 138/82  ?Well-controlled with Atenolol (due to h/o tremors) ?Counseled for compliance with the medications ?Advised DASH diet and moderate exercise/walking as tolerated ?  ?  ? Relevant Medications  ? rosuvastatin (CRESTOR) 10 MG tablet  ? PVD (peripheral vascular disease) ([place])  ?  Has had vascular surgery eval ?Started Crestor today considering his HLD ?Vascular surgery did not recommend starting aspirin considering his age and has likely good peripheral circulation currently ? ?  ?  ? Relevant Medications  ? rosuvastatin (CRESTOR) 10 MG tablet  ?  ? Respiratory  ? Centrilobular emphysema ([place])  ?  On Trelegy and PRN Albuterol nebs and inhaler ?Quit  smoking ?On Singulair 10 mg QD ?Followed by Pulmology ? ?  ?  ? Chronic respiratory failure with hypoxia (HCC)  ?  Likely due to COPD ?Has home O2, uses it at nighttime ? ?  ?  ?  ? Endocrine  ? Hypothyroidism - Primary

## 2021-12-20 NOTE — Assessment & Plan Note (Signed)
BP Readings from Last 1 Encounters:  ?12/20/21 138/82  ? ?Well-controlled with Atenolol (due to h/o tremors) ?Counseled for compliance with the medications ?Advised DASH diet and moderate exercise/walking as tolerated ?

## 2021-12-20 NOTE — Assessment & Plan Note (Signed)
Likely due to COPD Has home O2, uses it at nighttime 

## 2021-12-20 NOTE — Assessment & Plan Note (Signed)
Has underlying COPD, but has component of exertional dyspnea ?Will get echo to rule out CHF ?

## 2021-12-20 NOTE — Assessment & Plan Note (Signed)
On Trelegy and PRN Albuterol nebs and inhaler ?Quit smoking ?On Singulair 10 mg QD ?Followed by Pulmology ?

## 2021-12-20 NOTE — Assessment & Plan Note (Addendum)
Has had vascular surgery eval ?Started Crestor today considering his HLD ?Vascular surgery did not recommend starting aspirin considering his age and has likely good peripheral circulation currently ?

## 2021-12-20 NOTE — Assessment & Plan Note (Signed)
Lab Results  Component Value Date   TSH 7.930 (H) 12/14/2021   Started levothyroxine 25 mcg daily Check TSH and free T4 

## 2021-12-23 DIAGNOSIS — J449 Chronic obstructive pulmonary disease, unspecified: Secondary | ICD-10-CM | POA: Diagnosis not present

## 2022-01-18 ENCOUNTER — Ambulatory Visit (HOSPITAL_COMMUNITY)
Admission: RE | Admit: 2022-01-18 | Discharge: 2022-01-18 | Disposition: A | Discharge status: Home / Self Care | Payer: Medicare Other | Source: Ambulatory Visit | Attending: Internal Medicine | Admitting: Internal Medicine

## 2022-01-18 DIAGNOSIS — R0609 Other forms of dyspnea: Secondary | ICD-10-CM

## 2022-01-18 LAB — ECHOCARDIOGRAM COMPLETE
AR max vel: 1.04 cm2
AV Area VTI: 1.04 cm2
AV Area mean vel: 1.05 cm2
AV Mean grad: 15 mmHg
AV Peak grad: 27.7 mmHg
Ao pk vel: 2.63 m/s
Area-P 1/2: 2.6 cm2
S' Lateral: 3.1 cm

## 2022-01-18 NOTE — Progress Notes (Signed)
*  PRELIMINARY RESULTS* Echocardiogram 2D Echocardiogram has been performed.  Stacey Drain 01/18/2022, 12:35 PM

## 2022-01-19 ENCOUNTER — Other Ambulatory Visit: Payer: Self-pay | Admitting: *Deleted

## 2022-01-19 DIAGNOSIS — I739 Peripheral vascular disease, unspecified: Secondary | ICD-10-CM

## 2022-01-19 DIAGNOSIS — I10 Essential (primary) hypertension: Secondary | ICD-10-CM

## 2022-01-22 DIAGNOSIS — J449 Chronic obstructive pulmonary disease, unspecified: Secondary | ICD-10-CM | POA: Diagnosis not present

## 2022-02-18 ENCOUNTER — Other Ambulatory Visit: Payer: Self-pay | Admitting: Internal Medicine

## 2022-02-18 DIAGNOSIS — G250 Essential tremor: Secondary | ICD-10-CM

## 2022-02-22 DIAGNOSIS — J449 Chronic obstructive pulmonary disease, unspecified: Secondary | ICD-10-CM | POA: Diagnosis not present

## 2022-02-27 ENCOUNTER — Other Ambulatory Visit: Payer: Self-pay | Admitting: Internal Medicine

## 2022-02-27 DIAGNOSIS — E782 Mixed hyperlipidemia: Secondary | ICD-10-CM

## 2022-02-27 DIAGNOSIS — E039 Hypothyroidism, unspecified: Secondary | ICD-10-CM

## 2022-02-27 DIAGNOSIS — I739 Peripheral vascular disease, unspecified: Secondary | ICD-10-CM

## 2022-03-01 NOTE — Progress Notes (Addendum)
CARDIOLOGY CONSULT NOTE       Patient ID: Martin Ford MRN: [id number] DOB/AGE: May 04, 1936 86 y.o.  Admit date: (Not on file) Referring Physician: Allena Katz Primary Physician: Anabel Halon, MD Primary Cardiologist: New Reason for Consultation: AS/Dyspne  Active Problems:   * No active hospital problems. *   HPI:  86 y.o. referred by Dr Allena Katz for dyspnea and AS. He has significant emphysema and uses oxygen at night He has PVD with ABI"s in the 0.7 range bilaterally 09/06/21 Last CXR 07/19/21 just emphysema and hyperinflation no CE or effusions He was recently started on statin and low dose synthroid for TSH in 8 range He uses Trelegy, albuterol and singulair for his breathing TTE done 01/18/22 reviewed EF normal 60-65% normal RV no signs PHT mild/mod AS mean gradient 15 peak 27.7 mmHg AVA 1.1 cm2 and DVI 0.41 He has no history of CAD  He has seen NP [facility] form pulmonary He smoked 3 ppd and quit in 70's PFTls 08/31/21 showed severe obstrucitve defect with BD respnse and mild diffusion defect Overnight oxymetry with 10 minutes sats < 88% and started on 2:'s [place]  No chest pain palpitations or syncope Dyspnea worse the last 2 months Still with cold feeling in legs Wears oxygen at night trying to get a concentrator for day  Retired use to truck and Conservator, 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 Married 33 years with older children  ROS All other systems reviewed and negative except as noted above  Past Medical History:  Diagnosis Date   Emphysema of lung (HCC)    Essential tremor    Hypertension     Family History  Problem Relation Age of Onset   Lung disease Paternal Grandfather     Social History   Socioeconomic History   Marital status: Married    Spouse name: Not on file   Number of children: Not on file   Years of education: Not on file   Highest education level: Not on file  Occupational History   Not on file  Tobacco Use   Smoking status: Former    Types:  Cigarettes   Smokeless tobacco: Never  Substance and Sexual Activity   Alcohol use: Yes    Alcohol/week: 2.0 standard drinks of alcohol    Types: 2 Cans of beer per week   Drug use: Never   Sexual activity: Not on file  Other Topics Concern   Not on file  Social History Narrative   Not on file   Social Determinants of Health   Financial Resource Strain: Low Risk  (05/30/2021)   Overall Financial Resource Strain (CARDIA)    Difficulty of Paying Living Expenses: Not hard at all  Food Insecurity: No Food Insecurity (05/30/2021)   Hunger Vital Sign    Worried About Running Out of Food in the Last Year: Never true    Ran Out of Food in the Last Year: Never true  Transportation Needs: No Transportation Needs (05/30/2021)   PRAPARE - Administrator, Civil Service (Medical): No    Lack of Transportation (Non-Medical): No  Physical Activity: Inactive (05/30/2021)   Exercise Vital Sign    Days of Exercise per Week: 0 days    Minutes of Exercise per Session: 0 min  Stress: No Stress Concern Present (05/30/2021)   [employer] of Occupational Health - Occupational Stress Questionnaire    Feeling of Stress : Not at all  Social Connections: Moderately Isolated (05/30/2021)   Social Connection  and Isolation Panel [NHANES]    Frequency of Communication with Friends and Family: Never    Frequency of Social Gatherings with Friends and Family: Twice a week    Attends Religious Services: Never    Database administrator or Organizations: Yes    Attends Banker Meetings: 1 to 4 times per year    Marital Status: Married  Catering manager Violence: Not At Risk (05/30/2021)   Humiliation, Afraid, Rape, and Kick questionnaire    Fear of Current or Ex-Partner: No    Emotionally Abused: No    Physically Abused: No    Sexually Abused: No    No past surgical history on file.    Current Outpatient Medications:    albuterol (PROVENTIL) (2.5 MG/3ML) 0.083% nebulizer solution,  SMARTSIG:1 Vial(s) Via Nebulizer 4 Times Daily PRN, Disp: , Rfl:    albuterol (VENTOLIN HFA) 108 (90 Base) MCG/ACT inhaler, Inhale 2 puffs into the lungs every 6 (six) hours as needed for wheezing or shortness of breath., Disp: 8 g, Rfl: 0   atenolol (TENORMIN) 50 MG tablet, Take 1 tablet (50 mg total) by mouth every morning., Disp: 90 tablet, Rfl: 3   Cholecalciferol (VITAMIN D3 ULTRA STRENGTH) 125 MCG (5000 UT) capsule, Take 5,000 Units by mouth daily., Disp: , Rfl:    Fluticasone-Umeclidin-Vilant (TRELEGY ELLIPTA) 200-62.5-25 MCG/ACT AEPB, Inhale 1 puff into the lungs daily., Disp: 60 each, Rfl: 11   levothyroxine (SYNTHROID) 25 MCG tablet, TAKE 1 TABLET BY MOUTH DAILY, Disp: 100 tablet, Rfl: 2   montelukast (SINGULAIR) 10 MG tablet, TAKE 1 TABLET BY MOUTH AT  BEDTIME, Disp: 100 tablet, Rfl: 2   rosuvastatin (CRESTOR) 10 MG tablet, TAKE 1 TABLET BY MOUTH IN THE  EVENING, Disp: 100 tablet, Rfl: 2    Physical Exam: There were no vitals taken for this visit.    Affect appropriate Elderly chronically ill COPDer HEENT: normal Neck supple with no adenopathy JVP normal no bruits no thyromegaly Lungs clear with no wheezing and good diaphragmatic motion Heart:  S1/S2 preserved AS murmur, no rub, gallop or click PMI normal Abdomen: benighn, BS positve, no tenderness, no AAA no bruit.  No HSM or HJR Unable to palpate pedal pulses  No edema Neuro non-focal Skin warm and dry No muscular weakness   Labs:   Lab Results  Component Value Date   WBC 7.6 08/15/2021   HGB 15.4 08/15/2021   HCT 46.0 08/15/2021   MCV 90 08/15/2021   PLT 356 08/15/2021   No results for input(s): "NA", "K", "CL", "CO2", "BUN", "CREATININE", "CALCIUM", "PROT", "BILITOT", "ALKPHOS", "ALT", "AST", "GLUCOSE" in the last 168 hours.  Invalid input(s): "LABALBU" No results found for: "CKTOTAL", "CKMB", "CKMBINDEX", "TROPONINI"  Lab Results  Component Value Date   CHOL 241 (H) 08/15/2021   Lab Results  Component  Value Date   HDL 57 08/15/2021   Lab Results  Component Value Date   LDLCALC 160 (H) 08/15/2021   Lab Results  Component Value Date   TRIG 132 08/15/2021   Lab Results  Component Value Date   CHOLHDL 4.2 08/15/2021   No results found for: "LDLDIRECT"    Radiology: No results found.  EKG: SR rate 58 RBBB / LAFB   ASSESSMENT AND PLAN:   AS: by DVI and gradients only mild estimated AVA moderate stenosis but should not be causing and issue at this point Given age and COPD would even be a marginal candidate for TAVR Will do f/u echo in a year  PVD:  Saw Dr Arbie Cookey [facility] 09/06/21 with no intervention Noted below knee tibial disease with moderate reduction in ABI's No rest pain or skin breakdown  Dyspnea: related to COPD f/u Dr Rebbeca Paul and nocturnal oxygen EF is normal on echo with no signs pulmonary HTN and degree of AS should not be contributing to symptoms  Hypothyroid:  started on low dose synthroid f/u primary for TSH HLD:  with AS/PVD started on statin LDL 160 prior to Rx  Bifasicular Block:  RBBB/LAFB in setting of AS follow ECG no evidence of high grade AV block or syncope   Repeat ABI's  Echo in a year and f/u   Signed: Charlton Haws 03/01/2022, 5:17 PM

## 2022-03-07 ENCOUNTER — Ambulatory Visit: Payer: Medicare Other | Admitting: Cardiovascular Disease

## 2022-03-07 ENCOUNTER — Encounter: Payer: Self-pay | Admitting: Cardiovascular Disease

## 2022-03-07 VITALS — BP 146/92 | HR 58 | Ht 69.0 in | Wt 185.2 lb

## 2022-03-07 DIAGNOSIS — I350 Nonrheumatic aortic (valve) stenosis: Secondary | ICD-10-CM | POA: Diagnosis not present

## 2022-03-07 DIAGNOSIS — I739 Peripheral vascular disease, unspecified: Secondary | ICD-10-CM | POA: Diagnosis not present

## 2022-03-07 DIAGNOSIS — E782 Mixed hyperlipidemia: Secondary | ICD-10-CM

## 2022-03-07 NOTE — Patient Instructions (Signed)
Medication Instructions:  Your physician recommends that you continue on your current medications as directed. Please refer to the Current Medication list given to you today.  *If you need a refill on your cardiac medications before your next appointment, please call your pharmacy*   Lab Work: NONE   If you have labs (blood work) drawn today and your tests are completely normal, you will receive your results only by: MyChart Message (if you have MyChart) OR A paper copy in the mail If you have any lab test that is abnormal or we need to change your treatment, we will call you to review the results.   Testing/Procedures: Your physician has requested that you have a lower or upper extremity arterial duplex. This test is an ultrasound of the arteries in the legs or arms. It looks at arterial blood flow in the legs and arms. Allow one hour for Lower and Upper Arterial scans. There are no restrictions or special instructions    Follow-Up: At [facility], you and your health needs are our priority.  As part of our continuing mission to provide you with exceptional heart care, we have created designated Provider Care Teams.  These Care Teams include your primary Cardiologist (physician) and Advanced Practice Providers (APPs -  Physician Assistants and Nurse Practitioners) who all work together to provide you with the care you need, when you need it.  We recommend signing up for the patient portal called "MyChart".  Sign up information is provided on this After Visit Summary.  MyChart is used to connect with patients for Virtual Visits (Telemedicine).  Patients are able to view lab/test results, encounter notes, upcoming appointments, etc.  Non-urgent messages can be sent to your provider as well.   To learn more about what you can do with MyChart, go to [email or web address].    Your next appointment:   6 month(s)  The format for your next appointment:   In Person  Provider:   Charlton Haws, MD    Other Instructions Thank you for choosing [facility]!    Important Information About Sugar

## 2022-03-09 ENCOUNTER — Ambulatory Visit (HOSPITAL_COMMUNITY)
Admission: RE | Admit: 2022-03-09 | Discharge: 2022-03-09 | Disposition: A | Discharge status: Home / Self Care | Payer: Medicare Other | Source: Ambulatory Visit | Attending: Cardiovascular Disease | Admitting: Cardiovascular Disease

## 2022-03-09 DIAGNOSIS — I70203 Unspecified atherosclerosis of native arteries of extremities, bilateral legs: Secondary | ICD-10-CM | POA: Diagnosis not present

## 2022-03-09 DIAGNOSIS — I739 Peripheral vascular disease, unspecified: Secondary | ICD-10-CM | POA: Insufficient documentation

## 2022-03-21 ENCOUNTER — Ambulatory Visit (INDEPENDENT_AMBULATORY_CARE_PROVIDER_SITE_OTHER): Payer: Medicare Other | Admitting: Internal Medicine

## 2022-03-21 ENCOUNTER — Encounter: Payer: Self-pay | Admitting: Internal Medicine

## 2022-03-21 VITALS — BP 164/64 | HR 59 | Ht 65.0 in | Wt 184.2 lb

## 2022-03-21 DIAGNOSIS — J9611 Chronic respiratory failure with hypoxia: Secondary | ICD-10-CM | POA: Diagnosis not present

## 2022-03-21 DIAGNOSIS — I10 Essential (primary) hypertension: Secondary | ICD-10-CM | POA: Diagnosis not present

## 2022-03-21 DIAGNOSIS — E039 Hypothyroidism, unspecified: Secondary | ICD-10-CM | POA: Diagnosis not present

## 2022-03-21 DIAGNOSIS — J432 Centrilobular emphysema: Secondary | ICD-10-CM

## 2022-03-21 MED ORDER — ALBUTEROL SULFATE (2.5 MG/3ML) 0.083% IN NEBU: 2.5000 mg | INHALATION_SOLUTION | Freq: Four times a day (QID) | RESPIRATORY_TRACT | 3 refills | Status: DC | PRN

## 2022-03-21 MED ORDER — LOSARTAN POTASSIUM 25 MG PO TABS: 25.0000 mg | ORAL_TABLET | Freq: Every day | ORAL | 0 refills | Status: DC

## 2022-03-21 MED ORDER — ALBUTEROL SULFATE HFA 108 (90 BASE) MCG/ACT IN AERS: 2.0000 | INHALATION_SPRAY | Freq: Four times a day (QID) | RESPIRATORY_TRACT | 5 refills | Status: DC | PRN

## 2022-03-21 NOTE — Progress Notes (Signed)
Established Patient Office Visit  Subjective:  Patient ID: Martin Ford, male    DOB: June 07, 1936  Age: 86 y.o. MRN: [id number]  CC:  Chief Complaint  Patient presents with   Follow-up    3 month follow up, c/o sob when he walks or with activity especially with the weather. Needs refill on albuterol inhaler [facility].     HPI Martin Ford is a 86 y.o. male with past medical history of COPD, HTN and essential tremors who presents for f/u of his chronic medical conditions.  HTN: BP is elevated today. Takes medications regularly. Patient denies headache, dizziness, chest pain, or palpitations.  COPD: He has been using Trelegy and as needed albuterol inhaler for dyspnea.  He has also started using O2 at nighttime.  He reports exertional dyspnea even with minimal activity at home.  He currently denies any LE swelling, orthopnea or PND.  Denies any fever or chills currently.  He requests refill of albuterol neb.  AS: He had echo, which showed moderate AS, after which he had cardiology evaluation.  He currently denies leg swelling.  Past Medical History:  Diagnosis Date   Emphysema of lung ([place])    Essential tremor    Hypertension     History reviewed. No pertinent surgical history.  Family History  Problem Relation Age of Onset   Lung disease Paternal Grandfather     Social History   Socioeconomic History   Marital status: Married    Spouse name: Not on file   Number of children: Not on file   Years of education: Not on file   Highest education level: Not on file  Occupational History   Not on file  Tobacco Use   Smoking status: Former    Types: Cigarettes   Smokeless tobacco: Never  Vaping Use   Vaping Use: Never used  Substance and Sexual Activity   Alcohol use: Yes    Alcohol/week: 2.0 standard drinks of alcohol    Types: 2 Cans of beer per week   Drug use: Never   Sexual activity: Not on file  Other Topics Concern   Not on file  Social History  Narrative   Not on file   Social Determinants of Health   Financial Resource Strain: Low Risk  (05/30/2021)   Overall Financial Resource Strain (CARDIA)    Difficulty of Paying Living Expenses: Not hard at all  Food Insecurity: No Food Insecurity (05/30/2021)   Hunger Vital Sign    Worried About Running Out of Food in the Last Year: Never true    Ran Out of Food in the Last Year: Never true  Transportation Needs: No Transportation Needs (05/30/2021)   PRAPARE - Hydrologist (Medical): No    Lack of Transportation (Non-Medical): No  Physical Activity: Inactive (05/30/2021)   Exercise Vital Sign    Days of Exercise per Week: 0 days    Minutes of Exercise per Session: 0 min  Stress: No Stress Concern Present (05/30/2021)   [place]    Feeling of Stress : Not at all  Social Connections: Moderately Isolated (05/30/2021)   Social Connection and Isolation Panel [NHANES]    Frequency of Communication with Friends and Family: Never    Frequency of Social Gatherings with Friends and Family: Twice a week    Attends Religious Services: Never    Marine scientist or Organizations: Yes    Attends [employer]  or Organization Meetings: 1 to 4 times per year    Marital Status: Married  Human resources officer Violence: Not At Risk (05/30/2021)   Humiliation, Afraid, Rape, and Kick questionnaire    Fear of Current or Ex-Partner: No    Emotionally Abused: No    Physically Abused: No    Sexually Abused: No    Outpatient Medications Prior to Visit  Medication Sig Dispense Refill   atenolol (TENORMIN) 50 MG tablet Take 1 tablet (50 mg total) by mouth every morning. 90 tablet 3   Fluticasone-Umeclidin-Vilant (TRELEGY ELLIPTA) 200-62.5-25 MCG/ACT AEPB Inhale 1 puff into the lungs daily. 60 each 11   levothyroxine (SYNTHROID) 25 MCG tablet TAKE 1 TABLET BY MOUTH DAILY 100 tablet 2   montelukast (SINGULAIR) 10 MG tablet  TAKE 1 TABLET BY MOUTH AT  BEDTIME 100 tablet 2   rosuvastatin (CRESTOR) 10 MG tablet TAKE 1 TABLET BY MOUTH IN THE  EVENING 100 tablet 2   albuterol (PROVENTIL) (2.5 MG/3ML) 0.083% nebulizer solution SMARTSIG:1 Vial(s) Via Nebulizer 4 Times Daily PRN     albuterol (VENTOLIN HFA) 108 (90 Base) MCG/ACT inhaler Inhale 2 puffs into the lungs every 6 (six) hours as needed for wheezing or shortness of breath. 8 g 0   No facility-administered medications prior to visit.    No Known Allergies  ROS Review of Systems  Constitutional:  Negative for chills and fever.  HENT:  Negative for congestion and sore throat.   Eyes:  Negative for pain and discharge.  Respiratory:  Positive for cough and shortness of breath.   Cardiovascular:  Negative for chest pain and palpitations.  Gastrointestinal:  Negative for diarrhea, nausea and vomiting.  Endocrine: Negative for polydipsia and polyuria.  Genitourinary:  Negative for dysuria and hematuria.  Musculoskeletal:  Negative for neck pain and neck stiffness.  Skin:  Negative for rash.  Neurological:  Positive for tremors. Negative for dizziness, weakness, numbness and headaches.  Psychiatric/Behavioral:  Negative for agitation and behavioral problems.       Objective:    Physical Exam Vitals reviewed.  Constitutional:      General: He is not in acute distress.    Appearance: He is not diaphoretic.  HENT:     Head: Normocephalic and atraumatic.     Nose: Nose normal.     Mouth/Throat:     Mouth: Mucous membranes are moist.  Eyes:     General: No scleral icterus.    Extraocular Movements: Extraocular movements intact.  Cardiovascular:     Rate and Rhythm: Normal rate and regular rhythm.     Pulses:          Dorsalis pedis pulses are 1+ on the right side and 1+ on the left side.     Heart sounds: Normal heart sounds. No murmur heard. Pulmonary:     Breath sounds: No wheezing or rales.     Comments: Decreased breath sounds at b/l lung  bases Musculoskeletal:     Cervical back: Neck supple. No tenderness.     Right lower leg: No edema.     Left lower leg: No edema.  Skin:    General: Skin is warm.     Findings: No erythema.  Neurological:     General: No focal deficit present.     Mental Status: He is alert and oriented to person, place, and time.  Psychiatric:        Mood and Affect: Mood normal.        Behavior: Behavior normal.  BP (!) 164/64 (BP Location: Left Arm)   Pulse (!) 59   Ht _0  (1.651 m)   Wt 184 lb 3.2 oz (83.6 kg)   SpO2 93%   BMI 30.65 kg/m  Wt Readings from Last 3 Encounters:  03/21/22 184 lb 3.2 oz (83.6 kg)  03/07/22 185 lb 3.2 oz (84 kg)  12/20/21 185 lb (83.9 kg)    Lab Results  Component Value Date   TSH 7.930 (H) 12/14/2021   Lab Results  Component Value Date   WBC 7.6 08/15/2021   HGB 15.4 08/15/2021   HCT 46.0 08/15/2021   MCV 90 08/15/2021   PLT 356 08/15/2021   Lab Results  Component Value Date   NA 139 08/15/2021   K 5.0 08/15/2021   CO2 24 08/15/2021   GLUCOSE 123 (H) 08/15/2021   BUN 18 08/15/2021   CREATININE 1.01 08/15/2021   BILITOT 1.1 08/15/2021   ALKPHOS 88 08/15/2021   AST 22 08/15/2021   ALT 16 08/15/2021   PROT 7.2 08/15/2021   ALBUMIN 4.6 08/15/2021   CALCIUM 9.9 08/15/2021   EGFR 74 08/15/2021   Lab Results  Component Value Date   CHOL 241 (H) 08/15/2021   Lab Results  Component Value Date   HDL 57 08/15/2021   Lab Results  Component Value Date   LDLCALC 160 (H) 08/15/2021   Lab Results  Component Value Date   TRIG 132 08/15/2021   Lab Results  Component Value Date   CHOLHDL 4.2 08/15/2021   Lab Results  Component Value Date   HGBA1C 5.3 12/14/2021      Assessment & Plan:   Problem List Items Addressed This Visit       Cardiovascular and Mediastinum   Essential hypertension - Primary    BP Readings from Last 1 Encounters:  03/21/22 (!) 164/64  Uncontrolled with Atenolol (due to h/o tremors) Added losartan 25  mg daily Counseled for compliance with the medications Advised DASH diet and moderate exercise/walking as tolerated      Relevant Medications   losartan (COZAAR) 25 MG tablet   Other Relevant Orders   Basic Metabolic Panel (BMET)     Respiratory   Centrilobular emphysema (HCC)    On Trelegy and PRN Albuterol nebs and inhaler Quit smoking On Singulair 10 mg QD Followed by Pulmology Refilled albuterol nebulizer      Relevant Medications   albuterol (VENTOLIN HFA) 108 (90 Base) MCG/ACT inhaler   albuterol (PROVENTIL) (2.5 MG/3ML) 0.083% nebulizer solution   Other Relevant Orders   CT Chest Wo Contrast   Chronic respiratory failure with hypoxia (HCC)    Likely due to COPD Has home O2, uses it at nighttime      Relevant Orders   CT Chest Wo Contrast     Endocrine   Hypothyroidism    Lab Results  Component Value Date   TSH 7.930 (H) 12/14/2021  Started levothyroxine 25 mcg daily Check TSH and free T4      Relevant Orders   TSH + free T4    Meds ordered this encounter  Medications   albuterol (VENTOLIN HFA) 108 (90 Base) MCG/ACT inhaler    Sig: Inhale 2 puffs into the lungs every 6 (six) hours as needed for wheezing or shortness of breath.    Dispense:  8 g    Refill:  5    Okay to substitute to generic alternative: Proair or Ventolin or Albuterol.   albuterol (PROVENTIL) (2.5 MG/3ML) 0.083% nebulizer solution  Sig: Take 3 mLs (2.5 mg total) by nebulization every 6 (six) hours as needed for wheezing or shortness of breath.    Dispense:  75 mL    Refill:  3   losartan (COZAAR) 25 MG tablet    Sig: Take 1 tablet (25 mg total) by mouth daily.    Dispense:  90 tablet    Refill:  0    Follow-up: Return in about 4 months (around 07/22/2022) for Annual physical.    Lindell Spar, MD

## 2022-03-21 NOTE — Patient Instructions (Signed)
Please start taking Losartan for high blood pressure.  Please continue taking other medications as prescribed.  Please consider getting Shingrix and Tdap vaccine at your local pharmacy.

## 2022-03-23 NOTE — Assessment & Plan Note (Signed)
Lab Results  Component Value Date   TSH 7.930 (H) 12/14/2021   Started levothyroxine 25 mcg daily Check TSH and free T4 

## 2022-03-23 NOTE — Assessment & Plan Note (Signed)
Likely due to COPD Has home O2, uses it at nighttime

## 2022-03-23 NOTE — Assessment & Plan Note (Addendum)
On Trelegy and PRN Albuterol nebs and inhaler Quit smoking On Singulair 10 mg QD Followed by Pulmology Refilled albuterol nebulizer

## 2022-03-23 NOTE — Assessment & Plan Note (Signed)
BP Readings from Last 1 Encounters:  03/21/22 (!) 164/64   Uncontrolled with Atenolol (due to h/o tremors) Added losartan 25 mg daily Counseled for compliance with the medications Advised DASH diet and moderate exercise/walking as tolerated

## 2022-03-24 DIAGNOSIS — J449 Chronic obstructive pulmonary disease, unspecified: Secondary | ICD-10-CM | POA: Diagnosis not present

## 2022-04-10 ENCOUNTER — Ambulatory Visit (HOSPITAL_COMMUNITY): Payer: Medicare Other

## 2022-04-17 ENCOUNTER — Ambulatory Visit (HOSPITAL_COMMUNITY)
Admission: RE | Admit: 2022-04-17 | Discharge: 2022-04-17 | Disposition: A | Discharge status: Home / Self Care | Payer: Medicare Other | Source: Ambulatory Visit | Attending: Internal Medicine | Admitting: Internal Medicine

## 2022-04-17 DIAGNOSIS — J432 Centrilobular emphysema: Secondary | ICD-10-CM | POA: Diagnosis not present

## 2022-04-17 DIAGNOSIS — R911 Solitary pulmonary nodule: Secondary | ICD-10-CM | POA: Diagnosis not present

## 2022-04-17 DIAGNOSIS — J9611 Chronic respiratory failure with hypoxia: Secondary | ICD-10-CM | POA: Insufficient documentation

## 2022-04-17 DIAGNOSIS — R053 Chronic cough: Secondary | ICD-10-CM | POA: Diagnosis not present

## 2022-04-17 DIAGNOSIS — J9811 Atelectasis: Secondary | ICD-10-CM | POA: Diagnosis not present

## 2022-04-17 DIAGNOSIS — R918 Other nonspecific abnormal finding of lung field: Secondary | ICD-10-CM | POA: Diagnosis not present

## 2022-04-18 ENCOUNTER — Encounter: Payer: Self-pay | Admitting: Internal Medicine

## 2022-04-18 DIAGNOSIS — I7121 Aneurysm of the ascending aorta, without rupture: Secondary | ICD-10-CM | POA: Insufficient documentation

## 2022-04-19 ENCOUNTER — Telehealth: Payer: Self-pay | Admitting: Internal Medicine

## 2022-04-19 NOTE — Telephone Encounter (Signed)
Patient advised with verbal understanding  

## 2022-04-19 NOTE — Telephone Encounter (Signed)
Returning a call

## 2022-04-24 DIAGNOSIS — J449 Chronic obstructive pulmonary disease, unspecified: Secondary | ICD-10-CM | POA: Diagnosis not present

## 2022-05-22 ENCOUNTER — Other Ambulatory Visit: Payer: Self-pay | Admitting: Internal Medicine

## 2022-05-22 DIAGNOSIS — I10 Essential (primary) hypertension: Secondary | ICD-10-CM

## 2022-05-25 DIAGNOSIS — J449 Chronic obstructive pulmonary disease, unspecified: Secondary | ICD-10-CM | POA: Diagnosis not present

## 2022-05-31 ENCOUNTER — Ambulatory Visit: Payer: Medicare Other

## 2022-06-01 ENCOUNTER — Encounter: Payer: Self-pay | Admitting: Nurse Practitioner

## 2022-06-01 ENCOUNTER — Ambulatory Visit (INDEPENDENT_AMBULATORY_CARE_PROVIDER_SITE_OTHER): Payer: Medicare Other | Admitting: Nurse Practitioner

## 2022-06-01 DIAGNOSIS — Z0000 Encounter for general adult medical examination without abnormal findings: Secondary | ICD-10-CM | POA: Diagnosis not present

## 2022-06-01 NOTE — Addendum Note (Signed)
Addended by: Eual Fines on: 06/01/2022 01:27 PM   Modules accepted: Orders, Level of Service

## 2022-06-01 NOTE — Progress Notes (Signed)
I connected with  Martin Ford on 06/01/22 by a audio enabled telemedicine application and verified that I am speaking with the correct person using two identifiers.  Patient Location: Home  Provider Location: Home Office  I discussed the limitations of evaluation and management by telemedicine. The patient expressed understanding and agreed to proceed.  Subjective:   Martin Ford is a 86 y.o. male who presents for Medicare Annual/Subsequent preventive examination.  Review of Systems           Objective:    There were no vitals filed for this visit. There is no height or weight on file to calculate BMI.     05/30/2021    1:47 PM  Advanced Directives  Does Patient Have a Medical Advance Directive? Yes  Type of Estate agent of [place];Living will    Current Medications (verified) Outpatient Encounter Medications as of 06/01/2022  Medication Sig   albuterol (PROVENTIL) (2.5 MG/3ML) 0.083% nebulizer solution Take 3 mLs (2.5 mg total) by nebulization every 6 (six) hours as needed for wheezing or shortness of breath.   albuterol (VENTOLIN HFA) 108 (90 Base) MCG/ACT inhaler Inhale 2 puffs into the lungs every 6 (six) hours as needed for wheezing or shortness of breath.   atenolol (TENORMIN) 50 MG tablet Take 1 tablet (50 mg total) by mouth every morning.   Fluticasone-Umeclidin-Vilant (TRELEGY ELLIPTA) 200-62.5-25 MCG/ACT AEPB Inhale 1 puff into the lungs daily.   levothyroxine (SYNTHROID) 25 MCG tablet TAKE 1 TABLET BY MOUTH DAILY   losartan (COZAAR) 25 MG tablet TAKE 1 TABLET BY MOUTH DAILY   montelukast (SINGULAIR) 10 MG tablet TAKE 1 TABLET BY MOUTH AT  BEDTIME   rosuvastatin (CRESTOR) 10 MG tablet TAKE 1 TABLET BY MOUTH IN THE  EVENING   No facility-administered encounter medications on file as of 06/01/2022.    Allergies (verified) Patient has no known allergies.   History: Past Medical History:  Diagnosis Date   Emphysema of lung (HCC)     Essential tremor    Hypertension    No past surgical history on file. Family History  Problem Relation Age of Onset   Lung disease Paternal Grandfather    Social History   Socioeconomic History   Marital status: Married    Spouse name: Not on file   Number of children: Not on file   Years of education: Not on file   Highest education level: Not on file  Occupational History   Not on file  Tobacco Use   Smoking status: Former    Types: Cigarettes   Smokeless tobacco: Never  Vaping Use   Vaping Use: Never used  Substance and Sexual Activity   Alcohol use: Yes    Alcohol/week: 2.0 standard drinks of alcohol    Types: 2 Cans of beer per week   Drug use: Never   Sexual activity: Not on file  Other Topics Concern   Not on file  Social History Narrative   Not on file   Social Determinants of Health   Financial Resource Strain: Low Risk  (05/30/2021)   Overall Financial Resource Strain (CARDIA)    Difficulty of Paying Living Expenses: Not hard at all  Food Insecurity: No Food Insecurity (05/30/2021)   Hunger Vital Sign    Worried About Running Out of Food in the Last Year: Never true    Ran Out of Food in the Last Year: Never true  Transportation Needs: No Transportation Needs (05/30/2021)   PRAPARE - Transportation  Lack of Transportation (Medical): No    Lack of Transportation (Non-Medical): No  Physical Activity: Inactive (05/30/2021)   Exercise Vital Sign    Days of Exercise per Week: 0 days    Minutes of Exercise per Session: 0 min  Stress: No Stress Concern Present (05/30/2021)   [place]    Feeling of Stress : Not at all  Social Connections: Moderately Isolated (05/30/2021)   Social Connection and Isolation Panel [NHANES]    Frequency of Communication with Friends and Family: Never    Frequency of Social Gatherings with Friends and Family: Twice a week    Attends Religious Services: Never     Marine scientist or Organizations: Yes    Attends Music therapist: 1 to 4 times per year    Marital Status: Married    Tobacco Counseling Counseling given: Not Answered   Clinical Intake:                 Diabetic?no         Activities of Daily Living     No data to display          Patient Care Team: Lindell Spar, MD as PCP - General (Internal Medicine)  Indicate any recent Medical Services you may have received from other than [facility] providers in the past year (date may be approximate).     Assessment:   This is a routine wellness examination for Martin Ford.  Hearing/Vision screen No results found.  Dietary issues and exercise activities discussed:     Goals Addressed   None    Depression Screen    03/21/2022   10:45 AM 12/20/2021   10:35 AM 08/21/2021   10:32 AM 05/30/2021    1:45 PM 05/23/2021    9:06 AM  PHQ 2/9 Scores  PHQ - 2 Score 0 0 0 0 0    Fall Risk    03/21/2022   10:44 AM 12/20/2021   10:34 AM 08/21/2021   10:32 AM 05/23/2021    9:05 AM  Fall Risk   Falls in the past year? 1 0 1 1  Number falls in past yr: 1 0 1 0  Injury with Fall? 0 0 1 0  Risk for fall due to : History of fall(s) No Fall Risks History of fall(s);Impaired mobility History of fall(s);Impaired balance/gait;Impaired mobility  Follow up Education provided;Falls evaluation completed Falls evaluation completed Falls evaluation completed;Falls prevention discussed Falls evaluation completed;Education provided;Falls prevention discussed;Follow up appointment    FALL RISK PREVENTION PERTAINING TO THE HOME:  Any stairs in or around the home? No  If so, are there any without handrails? N/a Home free of loose throw rugs in walkways, pet beds, electrical cords, etc? Yes  Adequate lighting in your home to reduce risk of falls? Yes   ASSISTIVE DEVICES UTILIZED TO PREVENT FALLS:  Life alert? No  Use of a cane, walker or w/c? Yes  Grab bars in the  bathroom? No  Shower chair or bench in shower? Yes  Elevated toilet seat or a handicapped toilet? Yes   TIMED UP AND GO:  Was the test performed? No .  Length of time to ambulate 10 feet: sec.     Cognitive Function:        05/30/2021    1:49 PM  6CIT Screen  What Year? 0 points  What month? 0 points  What time? 0 points  Count back from 20 0  points  Months in reverse 2 points  Repeat phrase 10 points  Total Score 12 points    Immunizations Immunization History  Administered Date(s) Administered   Janssen (J&J) SARS-COV-2 Vaccination 03/23/2020   Moderna Sars-Covid-2 Vaccination 08/24/2020    TDAP status: Due, Education has been provided regarding the importance of this vaccine. Advised may receive this vaccine at local pharmacy or Health Dept. Aware to provide a copy of the vaccination record if obtained from local pharmacy or Health Dept. Verbalized acceptance and understanding.  Flu Vaccine status: Due, Education has been provided regarding the importance of this vaccine. Advised may receive this vaccine at local pharmacy or Health Dept. Aware to provide a copy of the vaccination record if obtained from local pharmacy or Health Dept. Verbalized acceptance and understanding.  Pneumococcal vaccine status: Due, Education has been provided regarding the importance of this vaccine. Advised may receive this vaccine at local pharmacy or Health Dept. Aware to provide a copy of the vaccination record if obtained from local pharmacy or Health Dept. Verbalized acceptance and understanding.  Covid-19 vaccine status: Information provided on how to obtain vaccines.   Qualifies for Shingles Vaccine? Yes   Zostavax completed No   Shingrix Completed?: No.    Education has been provided regarding the importance of this vaccine. Patient has been advised to call insurance company to determine out of pocket expense if they have not yet received this vaccine. Advised may also receive vaccine  at local pharmacy or Health Dept. Verbalized acceptance and understanding.  Screening Tests Health Maintenance  Topic Date Due   Pneumonia Vaccine 66+ Years old (1 - PCV) Never done   TETANUS/TDAP  Never done   Zoster Vaccines- Shingrix (1 of 2) Never done   COVID-19 Vaccine (3 - Booster for [employer] series) 10/19/2020   INFLUENZA VACCINE  Never done   HPV VACCINES  Aged Out    Health Maintenance  Health Maintenance Due  Topic Date Due   Pneumonia Vaccine 58+ Years old (1 - PCV) Never done   TETANUS/TDAP  Never done   Zoster Vaccines- Shingrix (1 of 2) Never done   COVID-19 Vaccine (3 - Booster for Janssen series) 10/19/2020   INFLUENZA VACCINE  Never done         Lung Cancer Screening: (Low Dose CT Chest recommended if Age 30-80 years, 30 pack-year currently smoking OR have quit w/in 15years.) does not qualify.   Lung Cancer Screening Referral: n/a  Additional Screening:  Hepatitis C Screening: does not qualify; Completed   Vision Screening: Recommended annual ophthalmology exams for early detection of glaucoma and other disorders of the eye. Is the patient up to date with their annual eye exam?  Yes  Who is the provider or what is the name of the office in which the patient attends annual eye exams? My eye doctor in [place] If pt is not established with a provider, would they like to be referred to a provider to establish care? .   Dental Screening: Recommended annual dental exams for proper oral hygiene  Community Resource Referral / Chronic Care Management: CRR required this visit?  No   CCM required this visit?  No      Plan:     I have personally reviewed and noted the following in the patient's chart:   Medical and social history Use of alcohol, tobacco or illicit drugs  Current medications and supplements including opioid prescriptions. Patient is not currently taking opioid prescriptions. Functional ability and status Nutritional  status Physical  activity Advanced directives List of other physicians Hospitalizations, surgeries, and ER visits in previous 12 months Vitals Screenings to include cognitive, depression, and falls Referrals and appointments  In addition, I have reviewed and discussed with patient certain preventive protocols, quality metrics, and best practice recommendations. A written personalized care plan for preventive services as well as general preventive health recommendations were provided to patient.     Donell Beers, FNP   06/01/2022   Nurse Notes:

## 2022-06-01 NOTE — Patient Instructions (Addendum)
  Mr. Soffer , Thank you for taking time to come for your Medicare Wellness Visit. I appreciate your ongoing commitment to your health goals. Please review the following plan we discussed and let me know if I can assist you in the future.   Please bring a copy of your advanced directives document to your next appointment so we can scan it and put it in your medical records .   These are the goals we discussed:  Goals      Increase physical activity     Continue to stay active by doing walking exercises daily.         This is a list of the screening recommended for you and due dates:  Health Maintenance  Topic Date Due   Pneumonia Vaccine (1 - PCV) Never done   Tetanus Vaccine  Never done   Zoster (Shingles) Vaccine (1 of 2) Never done   COVID-19 Vaccine (3 - Booster for Janssen series) 10/19/2020   Flu Shot  Never done   HPV Vaccine  Aged Out

## 2022-06-14 ENCOUNTER — Encounter: Payer: Self-pay | Admitting: Internal Medicine

## 2022-06-14 ENCOUNTER — Ambulatory Visit (INDEPENDENT_AMBULATORY_CARE_PROVIDER_SITE_OTHER): Payer: Medicare Other | Admitting: Internal Medicine

## 2022-06-14 VITALS — BP 134/76 | HR 66 | Resp 18 | Ht 71.0 in | Wt 182.0 lb

## 2022-06-14 DIAGNOSIS — I10 Essential (primary) hypertension: Secondary | ICD-10-CM

## 2022-06-14 DIAGNOSIS — E039 Hypothyroidism, unspecified: Secondary | ICD-10-CM | POA: Diagnosis not present

## 2022-06-14 DIAGNOSIS — R911 Solitary pulmonary nodule: Secondary | ICD-10-CM | POA: Diagnosis not present

## 2022-06-14 DIAGNOSIS — Z2821 Immunization not carried out because of patient refusal: Secondary | ICD-10-CM | POA: Insufficient documentation

## 2022-06-14 DIAGNOSIS — I7121 Aneurysm of the ascending aorta, without rupture: Secondary | ICD-10-CM

## 2022-06-14 MED ORDER — MISC. DEVICES MISC: 0 refills | Status: DC

## 2022-06-14 NOTE — Assessment & Plan Note (Signed)
RLL 7.6 mm nodule Check repeat CT chest for stability

## 2022-06-14 NOTE — Assessment & Plan Note (Signed)
Lab Results  Component Value Date   TSH 7.930 (H) 12/14/2021   Started levothyroxine 25 mcg daily Check TSH and free T4

## 2022-06-14 NOTE — Assessment & Plan Note (Signed)
Noted on CT chest Needs annual CTA chest

## 2022-06-14 NOTE — Progress Notes (Signed)
Established Patient Office Visit  Subjective:  Patient ID: Martin Ford, male    DOB: February 12, 1936  Age: 86 y.o. MRN: [id number]  CC:  Chief Complaint  Patient presents with   Hypertension    Hypertension process compliance    HPI Martin Ford is a 86 y.o. male with past medical history of COPD, HTN and essential tremors who presents for f/u of his chronic medical conditions.  HTN: BP is well-controlled. Takes medications regularly. Patient denies headache, dizziness, chest pain or palpitations.  Hypothyroidism: He takes levothyroxine 25 mcg daily.  He has not had TSH testing recently, but he agrees to get it done today.  His recent CT chest showed pulmonary nodule, and needs repeat CT chest.  He has chronic exertional dyspnea.  He uses Trelegy and as needed albuterol for COPD.  Denies any leg swelling, orthopnea or PND currently.  Past Medical History:  Diagnosis Date   Emphysema of lung ([place])    Essential tremor    Hypertension     History reviewed. No pertinent surgical history.  Family History  Problem Relation Age of Onset   Lung disease Paternal Grandfather     Social History   Socioeconomic History   Marital status: Married    Spouse name: Not on file   Number of children: Not on file   Years of education: Not on file   Highest education level: Not on file  Occupational History   Not on file  Tobacco Use   Smoking status: Former    Types: Cigarettes    Quit date: 1    Years since quitting: 50.8   Smokeless tobacco: Never  Vaping Use   Vaping Use: Never used  Substance and Sexual Activity   Alcohol use: Yes    Alcohol/week: 2.0 standard drinks of alcohol    Types: 2 Cans of beer per week    Comment: 1-2 cans of beer daily   Drug use: Never   Sexual activity: Not on file  Other Topics Concern   Not on file  Social History Narrative   Not on file   Social Determinants of Health   Financial Resource Strain: Low Risk  (06/01/2022)   Overall  Financial Resource Strain (CARDIA)    Difficulty of Paying Living Expenses: Not hard at all  Food Insecurity: No Food Insecurity (06/01/2022)   Hunger Vital Sign    Worried About Running Out of Food in the Last Year: Never true    Ran Out of Food in the Last Year: Never true  Transportation Needs: No Transportation Needs (06/01/2022)   PRAPARE - Hydrologist (Medical): No    Lack of Transportation (Non-Medical): No  Physical Activity: Inactive (06/01/2022)   Exercise Vital Sign    Days of Exercise per Week: 0 days    Minutes of Exercise per Session: 0 min  Stress: No Stress Concern Present (06/01/2022)   [place]    Feeling of Stress : Not at all  Social Connections: Unknown (06/01/2022)   Social Connection and Isolation Panel [NHANES]    Frequency of Communication with Friends and Family: Not on file    Frequency of Social Gatherings with Friends and Family: Not on file    Attends Religious Services: Never    Marine scientist or Organizations: No    Attends Archivist Meetings: Never    Marital Status: Married  Human resources officer Violence: Not At Risk (  06/01/2022)   Humiliation, Afraid, Rape, and Kick questionnaire    Fear of Current or Ex-Partner: No    Emotionally Abused: No    Physically Abused: No    Sexually Abused: No    Outpatient Medications Prior to Visit  Medication Sig Dispense Refill   albuterol (PROVENTIL) (2.5 MG/3ML) 0.083% nebulizer solution Take 3 mLs (2.5 mg total) by nebulization every 6 (six) hours as needed for wheezing or shortness of breath. 75 mL 3   albuterol (VENTOLIN HFA) 108 (90 Base) MCG/ACT inhaler Inhale 2 puffs into the lungs every 6 (six) hours as needed for wheezing or shortness of breath. 8 g 5   atenolol (TENORMIN) 50 MG tablet Take 1 tablet (50 mg total) by mouth every morning. 90 tablet 3   Fluticasone-Umeclidin-Vilant (TRELEGY ELLIPTA)  200-62.5-25 MCG/ACT AEPB Inhale 1 puff into the lungs daily. 60 each 11   levothyroxine (SYNTHROID) 25 MCG tablet TAKE 1 TABLET BY MOUTH DAILY 100 tablet 2   losartan (COZAAR) 25 MG tablet TAKE 1 TABLET BY MOUTH DAILY 100 tablet 3   montelukast (SINGULAIR) 10 MG tablet TAKE 1 TABLET BY MOUTH AT  BEDTIME 100 tablet 2   rosuvastatin (CRESTOR) 10 MG tablet TAKE 1 TABLET BY MOUTH IN THE  EVENING 100 tablet 2   No facility-administered medications prior to visit.    No Known Allergies  ROS Review of Systems  Constitutional:  Negative for chills and fever.  HENT:  Negative for congestion and sore throat.   Eyes:  Negative for pain and discharge.  Respiratory:  Positive for cough and shortness of breath.   Cardiovascular:  Negative for chest pain and palpitations.  Gastrointestinal:  Negative for diarrhea, nausea and vomiting.  Endocrine: Negative for polydipsia and polyuria.  Genitourinary:  Negative for dysuria and hematuria.  Musculoskeletal:  Negative for neck pain and neck stiffness.  Skin:  Negative for rash.  Neurological:  Positive for tremors. Negative for dizziness, weakness, numbness and headaches.  Psychiatric/Behavioral:  Negative for agitation and behavioral problems.       Objective:    Physical Exam Vitals reviewed.  Constitutional:      General: He is not in acute distress.    Appearance: He is not diaphoretic.  HENT:     Head: Normocephalic and atraumatic.     Nose: Nose normal.     Mouth/Throat:     Mouth: Mucous membranes are moist.  Eyes:     General: No scleral icterus.    Extraocular Movements: Extraocular movements intact.  Cardiovascular:     Rate and Rhythm: Normal rate and regular rhythm.     Pulses:          Dorsalis pedis pulses are 1+ on the right side and 1+ on the left side.     Heart sounds: Normal heart sounds. No murmur heard. Pulmonary:     Breath sounds: No wheezing or rales.  Musculoskeletal:     Cervical back: Neck supple. No  tenderness.     Right lower leg: No edema.     Left lower leg: No edema.  Skin:    General: Skin is warm.     Findings: No erythema.  Neurological:     General: No focal deficit present.     Mental Status: He is alert and oriented to person, place, and time.  Psychiatric:        Mood and Affect: Mood normal.        Behavior: Behavior normal.  BP 134/76 (BP Location: Right Arm, Cuff Size: Normal)   Pulse 66   Resp 18   Ht _0  (1.803 m)   Wt 182 lb (82.6 kg)   SpO2 93%   BMI 25.38 kg/m  Wt Readings from Last 3 Encounters:  06/14/22 182 lb (82.6 kg)  03/21/22 184 lb 3.2 oz (83.6 kg)  03/07/22 185 lb 3.2 oz (84 kg)    Lab Results  Component Value Date   TSH 7.930 (H) 12/14/2021   Lab Results  Component Value Date   WBC 7.6 08/15/2021   HGB 15.4 08/15/2021   HCT 46.0 08/15/2021   MCV 90 08/15/2021   PLT 356 08/15/2021   Lab Results  Component Value Date   NA 139 08/15/2021   K 5.0 08/15/2021   CO2 24 08/15/2021   GLUCOSE 123 (H) 08/15/2021   BUN 18 08/15/2021   CREATININE 1.01 08/15/2021   BILITOT 1.1 08/15/2021   ALKPHOS 88 08/15/2021   AST 22 08/15/2021   ALT 16 08/15/2021   PROT 7.2 08/15/2021   ALBUMIN 4.6 08/15/2021   CALCIUM 9.9 08/15/2021   EGFR 74 08/15/2021   Lab Results  Component Value Date   CHOL 241 (H) 08/15/2021   Lab Results  Component Value Date   HDL 57 08/15/2021   Lab Results  Component Value Date   LDLCALC 160 (H) 08/15/2021   Lab Results  Component Value Date   TRIG 132 08/15/2021   Lab Results  Component Value Date   CHOLHDL 4.2 08/15/2021   Lab Results  Component Value Date   HGBA1C 5.3 12/14/2021      Assessment & Plan:   Problem List Items Addressed This Visit       Cardiovascular and Mediastinum   Essential hypertension - Primary    BP Readings from Last 1 Encounters:  06/14/22 134/76  Well-controlled with Atenolol (due to h/o tremors) and losartan 25 mg daily Counseled for compliance with the  medications Advised DASH diet and moderate exercise/walking as tolerated      Relevant Medications   Misc. Devices MISC   Other Relevant Orders   Basic Metabolic Panel (BMET)   Ascending aortic aneurysm ([place])    Noted on CT chest Needs annual CTA chest        Respiratory   Pulmonary nodule    RLL 7.6 mm nodule Check repeat CT chest for stability      Relevant Orders   CT Chest Wo Contrast     Endocrine   Hypothyroidism    Lab Results  Component Value Date   TSH 7.930 (H) 12/14/2021  Started levothyroxine 25 mcg daily Check TSH and free T4      Relevant Orders   Basic Metabolic Panel (BMET)   TSH + free T4     Other   Refused influenza vaccine    Meds ordered this encounter  Medications   Misc. Devices MISC    Sig: Blood pressure cuff/device - 1. ICD10: I10    Dispense:  1 each    Refill:  0    Follow-up: Return in about 3 months (around 09/14/2022) for Annual physical.    Lindell Spar, MD

## 2022-06-14 NOTE — Assessment & Plan Note (Signed)
BP Readings from Last 1 Encounters:  06/14/22 134/76   Well-controlled with Atenolol (due to h/o tremors) and losartan 25 mg daily Counseled for compliance with the medications Advised DASH diet and moderate exercise/walking as tolerated

## 2022-06-14 NOTE — Patient Instructions (Signed)
Please continue taking medications as prescribed.  Please continue to follow low salt diet and ambulate as tolerated.  Please check BP once daily and contact us if it runs above 140/90 on 3 consecutive readings. 

## 2022-06-24 DIAGNOSIS — J449 Chronic obstructive pulmonary disease, unspecified: Secondary | ICD-10-CM | POA: Diagnosis not present

## 2022-07-25 ENCOUNTER — Ambulatory Visit (HOSPITAL_COMMUNITY)
Admission: RE | Admit: 2022-07-25 | Discharge: 2022-07-25 | Disposition: A | Discharge status: Home / Self Care | Payer: Medicare Other | Source: Ambulatory Visit | Attending: Internal Medicine | Admitting: Internal Medicine

## 2022-07-25 DIAGNOSIS — J449 Chronic obstructive pulmonary disease, unspecified: Secondary | ICD-10-CM | POA: Diagnosis not present

## 2022-07-25 DIAGNOSIS — R911 Solitary pulmonary nodule: Secondary | ICD-10-CM | POA: Diagnosis not present

## 2022-07-25 DIAGNOSIS — R918 Other nonspecific abnormal finding of lung field: Secondary | ICD-10-CM | POA: Diagnosis not present

## 2022-07-30 ENCOUNTER — Encounter: Payer: Medicare Other | Admitting: Internal Medicine

## 2022-08-01 ENCOUNTER — Other Ambulatory Visit: Payer: Self-pay | Admitting: Internal Medicine

## 2022-08-01 DIAGNOSIS — I10 Essential (primary) hypertension: Secondary | ICD-10-CM

## 2022-08-23 NOTE — Progress Notes (Signed)
CARDIOLOGY CONSULT NOTE       Patient ID: Martin Ford MRN: [id number] DOB/AGE: 01-31-1936 86 y.o.  Admit date: (Not on file) Referring Physician: Allena Ford Primary Physician: Martin Halon, MD Primary Cardiologist: Martin Ford Reason for Consultation: AS/Dyspnea   HPI:  86 y.o. referred by Dr Martin Ford for dyspnea and AS. First seen by me 03/07/22 He has significant emphysema and uses oxygen at night He has PVD with ABI"s in the 0.7 range bilaterally 09/06/21 Last CXR 07/19/21 just emphysema and hyperinflation no CE or effusions He was recently started on statin and low dose synthroid for TSH in 8 range He uses Trelegy, albuterol and singulair for his breathing   TTE done 01/18/22 reviewed EF normal 60-65% normal RV no signs PHT mild/mod AS mean gradient 15 peak 27.7 mmHg AVA 1.1 cm2 and DVI 0.41 He has no history of CAD  He has seen Martin Ford form pulmonary He smoked 3 ppd and quit in 70's PFTls 08/31/21 showed severe obstrucitve defect with BD respnse and mild diffusion defect Overnight oxymetry with 10 minutes sats < 88% and started on 2:'s [name]  No chest pain palpitations or syncope Dyspnea worse the last 2 months Still with cold feeling in legs Wears oxygen at night trying to get a concentrator for day  Retired use to truck and Conservator, 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 Married 33 years with older children  Has a place in [facility] [place] and will be going there soon for the winter Discussed need to at least get flu shot and likely another COVID booster   ROS All other systems reviewed and negative except as noted above  Past Medical History:  Diagnosis Date   Emphysema of lung ([facility])    Essential tremor    Hypertension     Family History  Problem Relation Age of Onset   Lung disease Paternal Grandfather     Social History   Socioeconomic History   Marital status: Married    Spouse name: Not on file   Number of children: Not on file   Years of education: Not on file    Highest education level: Not on file  Occupational History   Not on file  Tobacco Use   Smoking status: Former    Types: Cigarettes    Quit date: 1973    Years since quitting: 51.0   Smokeless tobacco: Never  Vaping Use   Vaping Use: Never used  Substance and Sexual Activity   Alcohol use: Yes    Alcohol/week: 2.0 standard drinks of alcohol    Types: 2 Cans of beer per week    Comment: 1-2 cans of beer daily   Drug use: Never   Sexual activity: Not on file  Other Topics Concern   Not on file  Social History Narrative   Not on file   Social Determinants of Health   Financial Resource Strain: Low Risk  (06/01/2022)   Overall Financial Resource Strain (CARDIA)    Difficulty of Paying Living Expenses: Not hard at all  Food Insecurity: No Food Insecurity (06/01/2022)   Hunger Vital Sign    Worried About Running Out of Food in the Last Year: Never true    Ran Out of Food in the Last Year: Never true  Transportation Needs: No Transportation Needs (06/01/2022)   PRAPARE - Administrator, Civil Service (Medical): No    Lack of Transportation (Non-Medical): No  Physical Activity: Inactive (06/01/2022)   Exercise Vital Sign  Days of Exercise per Week: 0 days    Minutes of Exercise per Session: 0 min  Stress: No Stress Concern Present (06/01/2022)   [employer] of Occupational Health - Occupational Stress Questionnaire    Feeling of Stress : Not at all  Social Connections: Unknown (06/01/2022)   Social Connection and Isolation Panel [NHANES]    Frequency of Communication with Friends and Family: Not on file    Frequency of Social Gatherings with Friends and Family: Not on file    Attends Religious Services: Never    Active Member of Clubs or Organizations: No    Attends Banker Meetings: Never    Marital Status: Married  Catering manager Violence: Not At Risk (06/01/2022)   Humiliation, Afraid, Rape, and Kick questionnaire    Fear of Current or  Ex-Partner: No    Emotionally Abused: No    Physically Abused: No    Sexually Abused: No    No past surgical history on file.    Current Outpatient Medications:    albuterol (PROVENTIL) (2.5 MG/3ML) 0.083% nebulizer solution, Take 3 mLs (2.5 mg total) by nebulization every 6 (six) hours as needed for wheezing or shortness of breath., Disp: 75 mL, Rfl: 3   albuterol (VENTOLIN HFA) 108 (90 Base) MCG/ACT inhaler, Inhale 2 puffs into the lungs every 6 (six) hours as needed for wheezing or shortness of breath., Disp: 8 g, Rfl: 5   atenolol (TENORMIN) 50 MG tablet, TAKE 1 TABLET BY MOUTH IN THE  MORNING, Disp: 100 tablet, Rfl: 2   Fluticasone-Umeclidin-Vilant (TRELEGY ELLIPTA) 200-62.5-25 MCG/ACT AEPB, Inhale 1 puff into the lungs daily., Disp: 60 each, Rfl: 11   Misc. Devices MISC, Blood pressure cuff/device - 1. ICD10: I10, Disp: 1 each, Rfl: 0   levothyroxine (SYNTHROID) 25 MCG tablet, TAKE 1 TABLET BY MOUTH DAILY (Patient not taking: Reported on 08/31/2022), Disp: 100 tablet, Rfl: 2   losartan (COZAAR) 25 MG tablet, TAKE 1 TABLET BY MOUTH DAILY (Patient not taking: Reported on 08/31/2022), Disp: 100 tablet, Rfl: 3   montelukast (SINGULAIR) 10 MG tablet, TAKE 1 TABLET BY MOUTH AT  BEDTIME (Patient not taking: Reported on 08/31/2022), Disp: 100 tablet, Rfl: 2   rosuvastatin (CRESTOR) 10 MG tablet, TAKE 1 TABLET BY MOUTH IN THE  EVENING (Patient not taking: Reported on 08/31/2022), Disp: 100 tablet, Rfl: 2    Physical Exam: Blood pressure (!) 182/81, pulse 60, height 5\' 11"  (1.803 m), weight 183 lb 1.6 oz (83.1 kg), SpO2 95 %.    Affect appropriate Elderly chronically ill COPDer HEENT: normal Neck supple with no adenopathy JVP normal no bruits no thyromegaly Lungs clear with no wheezing and good diaphragmatic motion Heart:  S1/S2 preserved AS murmur, no rub, gallop or click PMI normal Abdomen: benighn, BS positve, no tenderness, no AAA no bruit.  No HSM or HJR Unable to palpate pedal  pulses  No edema Neuro non-focal Skin warm and dry No muscular weakness   Labs:   Lab Results  Component Value Date   WBC 7.6 08/15/2021   HGB 15.4 08/15/2021   HCT 46.0 08/15/2021   MCV 90 08/15/2021   PLT 356 08/15/2021   No results for input(s): "NA", "K", "CL", "CO2", "BUN", "CREATININE", "CALCIUM", "PROT", "BILITOT", "ALKPHOS", "ALT", "AST", "GLUCOSE" in the last 168 hours.  Invalid input(s): "LABALBU" No results found for: "CKTOTAL", "CKMB", "CKMBINDEX", "TROPONINI"  Lab Results  Component Value Date   CHOL 241 (H) 08/15/2021   Lab Results  Component Value Date  HDL 57 08/15/2021   Lab Results  Component Value Date   LDLCALC 160 (H) 08/15/2021   Lab Results  Component Value Date   TRIG 132 08/15/2021   Lab Results  Component Value Date   CHOLHDL 4.2 08/15/2021   No results found for: "LDLDIRECT"    Radiology: No results found.  EKG: SR rate 58 RBBB / LAFB   ASSESSMENT AND PLAN:   AS: by DVI and gradients only mild estimated AVA moderate stenosis but should not be causing and issue at this point Given age and COPD would even be a marginal candidate for TAVR Will do f/u echo in a year  PVD:  Saw Dr Arbie Cookey [facility] 09/06/21 with no intervention Noted below knee tibial disease with moderate reduction in ABI's No rest pain or skin breakdown  Dyspnea: related to COPD f/u Dr Rebbeca Paul and nocturnal oxygen EF is normal on echo with no signs pulmonary HTN and degree of AS should not be contributing to symptoms CT 07/25/22 with RLL nodule collection 6 mm no f/u imaging or PET recommended  Hypothyroid:  started on low dose synthroid f/u primary for TSH HLD:  with AS/PVD started on statin LDL 160 prior to Rx  Bifasicular Block:  RBBB/LAFB in setting of AS follow ECG no evidence of high grade AV block or syncope   Repeat ABI's  Echo May 2024  F/U in a year   Signed: Charlton Haws 08/31/2022, 10:58 AM

## 2022-08-24 DIAGNOSIS — J449 Chronic obstructive pulmonary disease, unspecified: Secondary | ICD-10-CM | POA: Diagnosis not present

## 2022-08-31 ENCOUNTER — Encounter: Payer: Self-pay | Admitting: Cardiovascular Disease

## 2022-08-31 ENCOUNTER — Ambulatory Visit: Payer: Medicare Other | Attending: Cardiovascular Disease | Admitting: Cardiovascular Disease

## 2022-08-31 VITALS — BP 182/81 | HR 60 | Ht 71.0 in | Wt 183.1 lb

## 2022-08-31 DIAGNOSIS — I350 Nonrheumatic aortic (valve) stenosis: Secondary | ICD-10-CM

## 2022-08-31 DIAGNOSIS — E782 Mixed hyperlipidemia: Secondary | ICD-10-CM | POA: Diagnosis not present

## 2022-08-31 DIAGNOSIS — I739 Peripheral vascular disease, unspecified: Secondary | ICD-10-CM | POA: Diagnosis not present

## 2022-08-31 NOTE — Patient Instructions (Signed)
Medication Instructions:  Your physician recommends that you continue on your current medications as directed. Please refer to the Current Medication list given to you today.   Labwork: None  Testing/Procedures: Your physician has requested that you have an ankle brachial index (ABI). During this test an ultrasound and blood pressure cuff are used to evaluate the arteries that supply the arms and legs with blood. Allow thirty minutes for this exam. There are no restrictions or special instructions.  Your physician has requested that you have an echocardiogram. Echocardiography is a painless test that uses sound waves to create images of your heart. It provides your doctor with information about the size and shape of your heart and how well your heart's chambers and valves are working. This procedure takes approximately one hour. There are no restrictions for this procedure. Please do NOT wear cologne, perfume, aftershave, or lotions (deodorant is allowed). Please arrive 15 minutes prior to your appointment time.   Follow-Up: Follow up with Dr. Eden Emms in May 2024.  Any Other Special Instructions Will Be Listed Below (If Applicable).     If you need a refill on your cardiac medications before your next appointment, please call your pharmacy.

## 2022-09-05 ENCOUNTER — Ambulatory Visit (HOSPITAL_COMMUNITY)
Admission: RE | Admit: 2022-09-05 | Discharge: 2022-09-05 | Disposition: A | Discharge status: Home / Self Care | Payer: Medicare Other | Source: Ambulatory Visit | Attending: Cardiovascular Disease | Admitting: Cardiovascular Disease

## 2022-09-05 DIAGNOSIS — I739 Peripheral vascular disease, unspecified: Secondary | ICD-10-CM | POA: Diagnosis not present

## 2022-09-05 DIAGNOSIS — I70213 Atherosclerosis of native arteries of extremities with intermittent claudication, bilateral legs: Secondary | ICD-10-CM | POA: Diagnosis not present

## 2022-09-10 ENCOUNTER — Telehealth: Payer: Self-pay

## 2022-09-10 DIAGNOSIS — I739 Peripheral vascular disease, unspecified: Secondary | ICD-10-CM

## 2022-09-10 NOTE — Telephone Encounter (Signed)
-----   Message from Josue Hector, MD sent at 09/09/2022  4:21 PM EST ----- F/U Dr Donnetta Hutching for moderate PVD should see in next 6-8 weeks

## 2022-09-10 NOTE — Telephone Encounter (Signed)
Patient notified and verbalized understanding. Referral for Dr. Leonia Reader- entered.

## 2022-09-12 ENCOUNTER — Encounter: Payer: Self-pay | Admitting: Vascular Surgery

## 2022-09-12 ENCOUNTER — Ambulatory Visit: Payer: Medicare Other | Admitting: Vascular Surgery

## 2022-09-12 VITALS — BP 167/85 | HR 66 | Temp 98.8°F | Ht 71.0 in | Wt 181.0 lb

## 2022-09-12 DIAGNOSIS — I739 Peripheral vascular disease, unspecified: Secondary | ICD-10-CM

## 2022-09-12 NOTE — Progress Notes (Signed)
[facility]ist of [place]  Patient name: Martin Ford MRN: [id number] DOB: Jan 23, 1936 Sex: male  REASON FOR CONSULT: Evaluation lower extremity arterial insufficiency  HPI: Martin Ford is a 87 y.o. male, who is today for evaluation and discussion of recent noninvasive studies showing lower extremity arterial insufficiency.  He is here today with his wife who helps with history and provides details.  He does not have any history of claudication.  He does not have any history of lower extremity tissue loss.  He does have knee pain and walks with a cane for balance.  Quit smoking in 1973.  He reports that his feet are cold to the touch.  He denies arterial rest pain.  Past Medical History:  Diagnosis Date   Emphysema of lung ([place])    Essential tremor    Hypertension     Family History  Problem Relation Age of Onset   Lung disease Paternal Grandfather     SOCIAL HISTORY: Social History   Socioeconomic History   Marital status: Married    Spouse name: Not on file   Number of children: Not on file   Years of education: Not on file   Highest education level: Not on file  Occupational History   Not on file  Tobacco Use   Smoking status: Former    Types: Cigarettes    Quit date: 16    Years since quitting: 51.0   Smokeless tobacco: Never  Vaping Use   Vaping Use: Never used  Substance and Sexual Activity   Alcohol use: Yes    Alcohol/week: 2.0 standard drinks of alcohol    Types: 2 Cans of beer per week    Comment: 1-2 cans of beer daily   Drug use: Never   Sexual activity: Not on file  Other Topics Concern   Not on file  Social History Narrative   Not on file   Social Determinants of Health   Financial Resource Strain: Low Risk  (06/01/2022)   Overall Financial Resource Strain (CARDIA)    Difficulty of Paying Living Expenses: Not hard at all  Food Insecurity: No Food Insecurity (06/01/2022)   Hunger Vital Sign     Worried About Running Out of Food in the Last Year: Never true    Ran Out of Food in the Last Year: Never true  Transportation Needs: No Transportation Needs (06/01/2022)   PRAPARE - Hydrologist (Medical): No    Lack of Transportation (Non-Medical): No  Physical Activity: Inactive (06/01/2022)   Exercise Vital Sign    Days of Exercise per Week: 0 days    Minutes of Exercise per Session: 0 min  Stress: No Stress Concern Present (06/01/2022)   [place]    Feeling of Stress : Not at all  Social Connections: Unknown (06/01/2022)   Social Connection and Isolation Panel [NHANES]    Frequency of Communication with Friends and Family: Not on file    Frequency of Social Gatherings with Friends and Family: Not on file    Attends Religious Services: Never    Active Member of Clubs or Organizations: No    Attends Archivist Meetings: Never    Marital Status: Married  Human resources officer Violence: Not At Risk (06/01/2022)   Humiliation, Afraid, Rape, and Kick questionnaire    Fear of Current or Ex-Partner: No    Emotionally Abused: No    Physically Abused: No  Sexually Abused: No    No Known Allergies  Current Outpatient Medications  Medication Sig Dispense Refill   albuterol (PROVENTIL) (2.5 MG/3ML) 0.083% nebulizer solution Take 3 mLs (2.5 mg total) by nebulization every 6 (six) hours as needed for wheezing or shortness of breath. 75 mL 3   albuterol (VENTOLIN HFA) 108 (90 Base) MCG/ACT inhaler Inhale 2 puffs into the lungs every 6 (six) hours as needed for wheezing or shortness of breath. 8 g 5   atenolol (TENORMIN) 50 MG tablet TAKE 1 TABLET BY MOUTH IN THE  MORNING 100 tablet 2   Fluticasone-Umeclidin-Vilant (TRELEGY ELLIPTA) 200-62.5-25 MCG/ACT AEPB Inhale 1 puff into the lungs daily. 60 each 11   Misc. Devices MISC Blood pressure cuff/device - 1. ICD10: I10 1 each 0   levothyroxine  (SYNTHROID) 25 MCG tablet TAKE 1 TABLET BY MOUTH DAILY (Patient not taking: Reported on 08/31/2022) 100 tablet 2   losartan (COZAAR) 25 MG tablet TAKE 1 TABLET BY MOUTH DAILY (Patient not taking: Reported on 08/31/2022) 100 tablet 3   montelukast (SINGULAIR) 10 MG tablet TAKE 1 TABLET BY MOUTH AT  BEDTIME (Patient not taking: Reported on 08/31/2022) 100 tablet 2   rosuvastatin (CRESTOR) 10 MG tablet TAKE 1 TABLET BY MOUTH IN THE  EVENING (Patient not taking: Reported on 08/31/2022) 100 tablet 2   No current facility-administered medications for this visit.    REVIEW OF SYSTEMS:  [X]  denotes positive finding, [ ]  denotes negative finding Cardiac  Comments:  Chest pain or chest pressure:    Shortness of breath upon exertion:    Short of breath when lying flat:    Irregular heart rhythm:        Vascular    Pain in calf, thigh, or hip brought on by ambulation:    Pain in feet at night that wakes you up from your sleep:     Blood clot in your veins:    Leg swelling:         Pulmonary    Oxygen at home:    Productive cough:     Wheezing:         Neurologic    Sudden weakness in arms or legs:     Sudden numbness in arms or legs:     Sudden onset of difficulty speaking or slurred speech:    Temporary loss of vision in one eye:     Problems with dizziness:         Gastrointestinal    Blood in stool:     Vomited blood:         Genitourinary    Burning when urinating:     Blood in urine:        Psychiatric    Major depression:         Hematologic    Bleeding problems:    Problems with blood clotting too easily:        Skin    Rashes or ulcers:        Constitutional    Fever or chills:      PHYSICAL EXAM: Vitals:   09/12/22 1511  BP: (!) 167/85  Pulse: 66  Temp: 98.8 F (37.1 C)  Weight: 181 lb (82.1 kg)  Height: 5\' 11"  (1.803 m)    GENERAL: The patient is a well-nourished male, in no acute distress. The vital signs are documented above. CARDIOVASCULAR: 2+  radial pulses bilaterally.  2+ popliteal pulses bilaterally.  I do not palpate pedal pulses bilaterally. PULMONARY:  There is good air exchange  MUSCULOSKELETAL: There are no major deformities or cyanosis. NEUROLOGIC: No focal weakness or paresthesias are detected. SKIN: There are no ulcers or rashes noted. PSYCHIATRIC: The patient has a normal affect.  DATA:  Noninvasive studies from 09/05/2022 were reviewed with the patient.  This reveals an ankle arm index of 0.7 at the dorsalis pedis level bilaterally with monophasic flow.  He has an audible at the posterior tibial level bilaterally.  MEDICAL ISSUES: I discussed these findings at length with the patient and his wife.  He does not have any evidence of critical limb ischemia.  He does not have any claudication.  Physical exam, he has normal flow to the level of the popliteal arteries.  I explained that he does have tibial disease which may partially account for his cold sensation in his feet.  He has no tissue loss and no rest pain.  He understands the need to keep a close eye on his feet and to seek attention should he develop any nonhealing wounds.  Otherwise he will see [place] on an as-needed basis   Larina Earthly, MD [facility] Tel [phone] Pager [phone]  Note: Portions of this report may have been transcribed using voice recognition software.  Every effort has been made to ensure accuracy; however, inadvertent computerized transcription errors may still be present.

## 2022-09-17 ENCOUNTER — Encounter: Payer: Medicare Other | Admitting: Internal Medicine

## 2022-09-24 DIAGNOSIS — J449 Chronic obstructive pulmonary disease, unspecified: Secondary | ICD-10-CM | POA: Diagnosis not present

## 2022-10-03 ENCOUNTER — Other Ambulatory Visit: Payer: Self-pay | Admitting: Internal Medicine

## 2022-10-03 ENCOUNTER — Other Ambulatory Visit: Payer: Self-pay | Admitting: Primary Care

## 2022-10-03 DIAGNOSIS — J432 Centrilobular emphysema: Secondary | ICD-10-CM

## 2022-10-25 DIAGNOSIS — J449 Chronic obstructive pulmonary disease, unspecified: Secondary | ICD-10-CM | POA: Diagnosis not present

## 2022-11-23 DIAGNOSIS — J449 Chronic obstructive pulmonary disease, unspecified: Secondary | ICD-10-CM | POA: Diagnosis not present

## 2022-12-24 DIAGNOSIS — J449 Chronic obstructive pulmonary disease, unspecified: Secondary | ICD-10-CM | POA: Diagnosis not present

## 2023-01-02 ENCOUNTER — Ambulatory Visit (HOSPITAL_COMMUNITY)
Admission: RE | Admit: 2023-01-02 | Discharge: 2023-01-02 | Disposition: A | Discharge status: Home / Self Care | Payer: Medicare Other | Source: Ambulatory Visit | Attending: Cardiovascular Disease | Admitting: Cardiovascular Disease

## 2023-01-02 ENCOUNTER — Telehealth: Payer: Self-pay | Admitting: Cardiovascular Disease

## 2023-01-02 DIAGNOSIS — I350 Nonrheumatic aortic (valve) stenosis: Secondary | ICD-10-CM

## 2023-01-02 LAB — ECHOCARDIOGRAM COMPLETE
AR max vel: 1.24 cm2
AV Area VTI: 1.23 cm2
AV Area mean vel: 1.15 cm2
AV Mean grad: 19 mmHg
AV Peak grad: 32.4 mmHg
Ao pk vel: 2.85 m/s
Area-P 1/2: 2.26 cm2
S' Lateral: 2.8 cm

## 2023-01-02 NOTE — Progress Notes (Signed)
Spoke with Mr. Valadez wife by phone concerning patient's Albuterol inhaler that he left in echo lab. Wife stated they could not come back to get inhaler today. Celesta Gentile, RCS

## 2023-01-02 NOTE — Telephone Encounter (Signed)
Patient is returning CMA's call for his echo results. He is requesting you speak with his wife regarding the results if he is not available when calling back. Please advise.

## 2023-01-02 NOTE — Progress Notes (Signed)
*  PRELIMINARY RESULTS* Echocardiogram 2D Echocardiogram has been performed.  Martin Ford 01/02/2023, 12:32 PM

## 2023-01-03 NOTE — Telephone Encounter (Signed)
Patient's wife notified and verbalized understanding. Pt's wife had no questions or concerns at this time.

## 2023-01-07 NOTE — Progress Notes (Signed)
CARDIOLOGY CONSULT NOTE       Patient ID: Martin Ford MRN: [id number] DOB/AGE: Jul 10, 1936 87 y.o.  Admit date: (Not on file) Referring Physician: Allena Katz Primary Physician: Anabel Halon, MD Primary Cardiologist: Eden Emms Reason for Consultation: AS/Dyspnea   HPI:  87 y.o. referred by Dr Allena Katz for dyspnea and AS. First seen by me 03/07/22 He has significant emphysema and uses oxygen at night He has PVD with ABI"s in the 0.7 range bilaterally 09/06/21 Last CXR 07/19/21 just emphysema and hyperinflation no CE or effusions He was recently started on statin and low dose synthroid for TSH in 8 range He uses Trelegy, albuterol and singulair for his breathing   TTE done 01/18/22 reviewed EF normal 60-65% normal RV no signs PHT mild/mod AS mean gradient 15 peak 27.7 mmHg AVA 1.1 cm2 and DVI 0.41 He has no history of CAD  He has seen NP [place] Dura form pulmonary He smoked 3 ppd and quit in 70's PFTls 08/31/21 showed severe obstrucitve defect with BD respnse and mild diffusion defect Overnight oxymetry with 10 minutes sats < 88% and started on 2:'Ls [place]  No chest pain palpitations or syncope    Retired use to truck and Conservator, 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 Married 34 years with older children  Has a place in [facility] [place] and winters there    TTE 01/02/23 EF 60-65% moderate AS mean gradient 21 mmhg  peak 32.4 mmHg DVI 0.39 and AVA 1.2 cm2  He has no cardiac symptoms No claudication despite moderate PVD. Needs to see the dentist   ROS All other systems reviewed and negative except as noted above  Past Medical History:  Diagnosis Date   Emphysema of lung ([facility])    Essential tremor    Hypertension     Family History  Problem Relation Age of Onset   Lung disease Paternal Grandfather     Social History   Socioeconomic History   Marital status: Married    Spouse name: Not on file   Number of children: Not on file   Years of education: Not on file   Highest education  level: Not on file  Occupational History   Not on file  Tobacco Use   Smoking status: Former    Types: Cigarettes    Quit date: 1973    Years since quitting: 51.3   Smokeless tobacco: Never  Vaping Use   Vaping Use: Never used  Substance and Sexual Activity   Alcohol use: Yes    Alcohol/week: 2.0 standard drinks of alcohol    Types: 2 Cans of beer per week    Comment: 1-2 cans of beer daily   Drug use: Never   Sexual activity: Not on file  Other Topics Concern   Not on file  Social History Narrative   Not on file   Social Determinants of Health   Financial Resource Strain: Low Risk  (06/01/2022)   Overall Financial Resource Strain (CARDIA)    Difficulty of Paying Living Expenses: Not hard at all  Food Insecurity: No Food Insecurity (06/01/2022)   Hunger Vital Sign    Worried About Running Out of Food in the Last Year: Never true    Ran Out of Food in the Last Year: Never true  Transportation Needs: No Transportation Needs (06/01/2022)   PRAPARE - Administrator, Civil Service (Medical): No    Lack of Transportation (Non-Medical): No  Physical Activity: Inactive (06/01/2022)   Exercise Vital Sign  Days of Exercise per Week: 0 days    Minutes of Exercise per Session: 0 min  Stress: No Stress Concern Present (06/01/2022)   [employer] of Occupational Health - Occupational Stress Questionnaire    Feeling of Stress : Not at all  Social Connections: Unknown (06/01/2022)   Social Connection and Isolation Panel [NHANES]    Frequency of Communication with Friends and Family: Not on file    Frequency of Social Gatherings with Friends and Family: Not on file    Attends Religious Services: Never    Active Member of Clubs or Organizations: No    Attends Banker Meetings: Never    Marital Status: Married  Catering manager Violence: Not At Risk (06/01/2022)   Humiliation, Afraid, Rape, and Kick questionnaire    Fear of Current or Ex-Partner: No     Emotionally Abused: No    Physically Abused: No    Sexually Abused: No    History reviewed. No pertinent surgical history.    Current Outpatient Medications:    albuterol (PROVENTIL) (2.5 MG/3ML) 0.083% nebulizer solution, USE 1 VIAL VIA NEBULIZER EVERY 6 HOURS AS NEEDED FOR WHEEZING OR  SHORTNESS OF BREATH, Disp: 300 mL, Rfl: 13   albuterol (VENTOLIN HFA) 108 (90 Base) MCG/ACT inhaler, USE 2 INHALATIONS BY MOUTH EVERY 6 HOURS AS NEEDED FOR WHEEZING  OR SHORTNESS OF BREATH, Disp: 17 g, Rfl: 6   atenolol (TENORMIN) 50 MG tablet, TAKE 1 TABLET BY MOUTH IN THE  MORNING, Disp: 100 tablet, Rfl: 2   TRELEGY ELLIPTA 200-62.5-25 MCG/ACT AEPB, USE 1 INHALATION BY MOUTH ONCE  DAILY AT THE SAME TIME EACH DAY, Disp: 180 each, Rfl: 3   levothyroxine (SYNTHROID) 25 MCG tablet, TAKE 1 TABLET BY MOUTH DAILY (Patient not taking: Reported on 08/31/2022), Disp: 100 tablet, Rfl: 2   Misc. Devices MISC, Blood pressure cuff/device - 1. ICD10: I10, Disp: 1 each, Rfl: 0   montelukast (SINGULAIR) 10 MG tablet, TAKE 1 TABLET BY MOUTH AT  BEDTIME (Patient not taking: Reported on 08/31/2022), Disp: 100 tablet, Rfl: 2    Physical Exam: Blood pressure (!) 140/80, pulse 69, height 5\' 10"  (1.778 m), weight 176 lb (79.8 kg), SpO2 97 %.    Affect appropriate Elderly chronically ill COPDer HEENT: normal Neck supple with no adenopathy JVP normal no bruits no thyromegaly Lungs clear with no wheezing and good diaphragmatic motion Heart:  S1/S2 preserved AS murmur, no rub, gallop or click PMI normal Abdomen: benighn, BS positve, no tenderness, no AAA no bruit.  No HSM or HJR Unable to palpate pedal pulses  No edema Neuro non-focal Skin warm and dry No muscular weakness   Labs:   Lab Results  Component Value Date   WBC 7.6 08/15/2021   HGB 15.4 08/15/2021   HCT 46.0 08/15/2021   MCV 90 08/15/2021   PLT 356 08/15/2021   No results for input(s): "NA", "K", "CL", "CO2", "BUN", "CREATININE", "CALCIUM", "PROT",  "BILITOT", "ALKPHOS", "ALT", "AST", "GLUCOSE" in the last 168 hours.  Invalid input(s): "LABALBU" No results found for: "CKTOTAL", "CKMB", "CKMBINDEX", "TROPONINI"  Lab Results  Component Value Date   CHOL 241 (H) 08/15/2021   Lab Results  Component Value Date   HDL 57 08/15/2021   Lab Results  Component Value Date   LDLCALC 160 (H) 08/15/2021   Lab Results  Component Value Date   TRIG 132 08/15/2021   Lab Results  Component Value Date   CHOLHDL 4.2 08/15/2021   No results found for: "LDLDIRECT"  Echo:  01/02/23  IMPRESSIONS     1. Left ventricular ejection fraction, by estimation, is 60 to 65%. The  left ventricle has normal function. The left ventricle has no regional  wall motion abnormalities. Left ventricular diastolic parameters are  indeterminate. Elevated left atrial  pressure.   2. Right ventricular systolic function is normal. The right ventricular  size is normal. There is mildly elevated pulmonary artery systolic  pressure.   3. Left atrial size was mildly dilated.   4. The mitral valve is normal in structure. Trivial mitral valve  regurgitation. No evidence of mitral stenosis.   5. The tricuspid valve is abnormal.   6. Highest AV mean gradient is by [facility] probe, measuring 21 mmHg. . The  aortic valve has an indeterminant number of cusps. There is severe  calcifcation of the aortic valve. There is severe thickening of the aortic  valve. Aortic valve regurgitation is  mild. Moderate aortic valve stenosis. Aortic valve peak gradient measures  32.4 mmHg. Aortic valve area, by VTI measures 1.23 cm.   7. The inferior vena cava is normal in size with greater than 50%  respiratory variability, suggesting right atrial pressure of 3 mmHg.    EKG: SR rate 58 RBBB / LAFB   ASSESSMENT AND PLAN:   AS: moderate see TTE above update May 2025 Given age and COPD would be a marginal candidate for even TAVR he will see the dentist and try to keep his oral care in  good shape PVD:  Saw Dr Arbie Cookey [facility] 09/06/21 with no intervention Noted below knee tibial disease with moderate reduction in ABI's No rest pain or skin breakdown ABI's 09/05/22 moderately reduced 0.49 on right and 0.43 on left F/U [facility]  Seen by Early 09/12/22 normal flow to level of popliteals with tibial dx Dyspnea: related to COPD f/u Dr Rebbeca Paul and nocturnal oxygen EF is normal on echo with no signs pulmonary HTN and degree of AS should not be contributing to symptoms CT 07/25/22 with RLL nodule collection 6 mm no f/u imaging or PET recommended  Hypothyroid:  started on low dose synthroid f/u primary for TSH HLD:  with AS/PVD started on statin LDL 160 prior to Rx  Bifasicular Block:  RBBB/LAFB in setting of AS follow ECG no evidence of high grade AV block or syncope   TTE May 2025 F/U [facility]  F/U pulmonary   F/U in a year  with cardiology  Signed: Charlton Haws 01/14/2023, 11:18 AM

## 2023-01-10 ENCOUNTER — Other Ambulatory Visit: Payer: Self-pay | Admitting: Internal Medicine

## 2023-01-10 DIAGNOSIS — J432 Centrilobular emphysema: Secondary | ICD-10-CM

## 2023-01-14 ENCOUNTER — Ambulatory Visit: Payer: Medicare Other | Attending: Cardiovascular Disease | Admitting: Cardiovascular Disease

## 2023-01-14 ENCOUNTER — Encounter: Payer: Self-pay | Admitting: Cardiovascular Disease

## 2023-01-14 VITALS — BP 140/80 | HR 69 | Ht 70.0 in | Wt 176.0 lb

## 2023-01-14 DIAGNOSIS — I739 Peripheral vascular disease, unspecified: Secondary | ICD-10-CM

## 2023-01-14 DIAGNOSIS — J431 Panlobular emphysema: Secondary | ICD-10-CM

## 2023-01-14 DIAGNOSIS — I452 Bifascicular block: Secondary | ICD-10-CM | POA: Diagnosis not present

## 2023-01-14 DIAGNOSIS — E782 Mixed hyperlipidemia: Secondary | ICD-10-CM | POA: Diagnosis not present

## 2023-01-14 DIAGNOSIS — I350 Nonrheumatic aortic (valve) stenosis: Secondary | ICD-10-CM | POA: Diagnosis not present

## 2023-01-14 NOTE — Patient Instructions (Signed)
Medication Instructions:  Your physician recommends that you continue on your current medications as directed. Please refer to the Current Medication list given to you today.  *If you need a refill on your cardiac medications before your next appointment, please call your pharmacy*   Lab Work: NONE   If you have labs (blood work) drawn today and your tests are completely normal, you will receive your results only by: MyChart Message (if you have MyChart) OR A paper copy in the mail If you have any lab test that is abnormal or we need to change your treatment, we will call you to review the results.   Testing/Procedures: Your physician has requested that you have an echocardiogram. Echocardiography is a painless test that uses sound waves to create images of your heart. It provides your doctor with information about the size and shape of your heart and how well your heart's chambers and valves are working. This procedure takes approximately one hour. There are no restrictions for this procedure. Please do NOT wear cologne, perfume, aftershave, or lotions (deodorant is allowed). Please arrive 15 minutes prior to your appointment time.    Follow-Up: At [facility], you and your health needs are our priority.  As part of our continuing mission to provide you with exceptional heart care, we have created designated Provider Care Teams.  These Care Teams include your primary Cardiologist (physician) and Advanced Practice Providers (APPs -  Physician Assistants and Nurse Practitioners) who all work together to provide you with the care you need, when you need it.  We recommend signing up for the patient portal called "MyChart".  Sign up information is provided on this After Visit Summary.  MyChart is used to connect with patients for Virtual Visits (Telemedicine).  Patients are able to view lab/test results, encounter notes, upcoming appointments, etc.  Non-urgent messages can be sent to  your provider as well.   To learn more about what you can do with MyChart, go to [email or web address].    Your next appointment:   1 year(s)  Provider:   Charlton Haws, MD    Other Instructions Thank you for choosing [facility]!

## 2023-01-23 DIAGNOSIS — J449 Chronic obstructive pulmonary disease, unspecified: Secondary | ICD-10-CM | POA: Diagnosis not present

## 2023-02-23 DIAGNOSIS — J449 Chronic obstructive pulmonary disease, unspecified: Secondary | ICD-10-CM | POA: Diagnosis not present

## 2023-03-25 DIAGNOSIS — J449 Chronic obstructive pulmonary disease, unspecified: Secondary | ICD-10-CM | POA: Diagnosis not present

## 2023-04-25 DIAGNOSIS — J449 Chronic obstructive pulmonary disease, unspecified: Secondary | ICD-10-CM | POA: Diagnosis not present

## 2023-06-29 ENCOUNTER — Other Ambulatory Visit: Payer: Self-pay | Admitting: Internal Medicine

## 2023-06-29 DIAGNOSIS — I10 Essential (primary) hypertension: Secondary | ICD-10-CM

## 2023-10-08 ENCOUNTER — Other Ambulatory Visit: Payer: Self-pay | Admitting: Internal Medicine

## 2023-10-08 DIAGNOSIS — J432 Centrilobular emphysema: Secondary | ICD-10-CM

## 2023-10-25 IMAGING — CR DG CHEST 2V
2 series · 2 of 2 positions shown · non-contrast
Comparison: 01/13/2015

CLINICAL DATA: COPD.  Wheezing.

EXAM:
CHEST - 2 VIEW

[w pa chest]
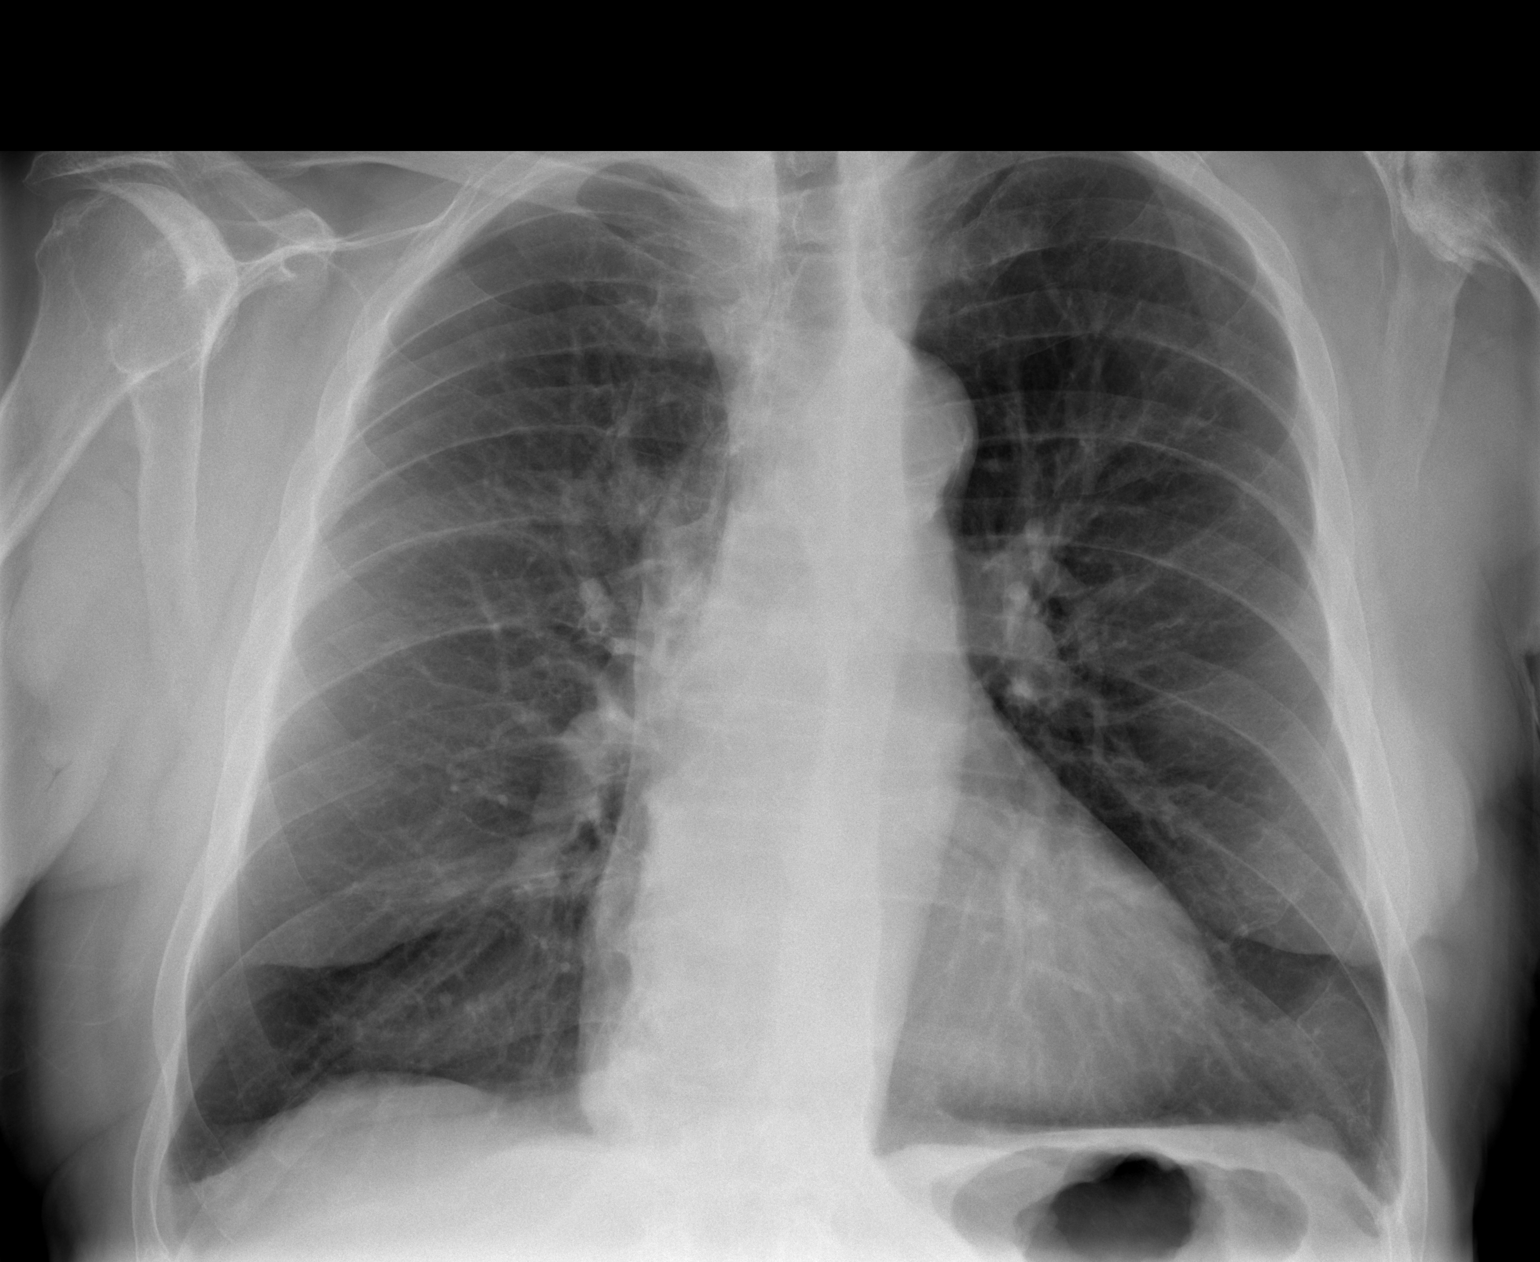

[w chest lat]
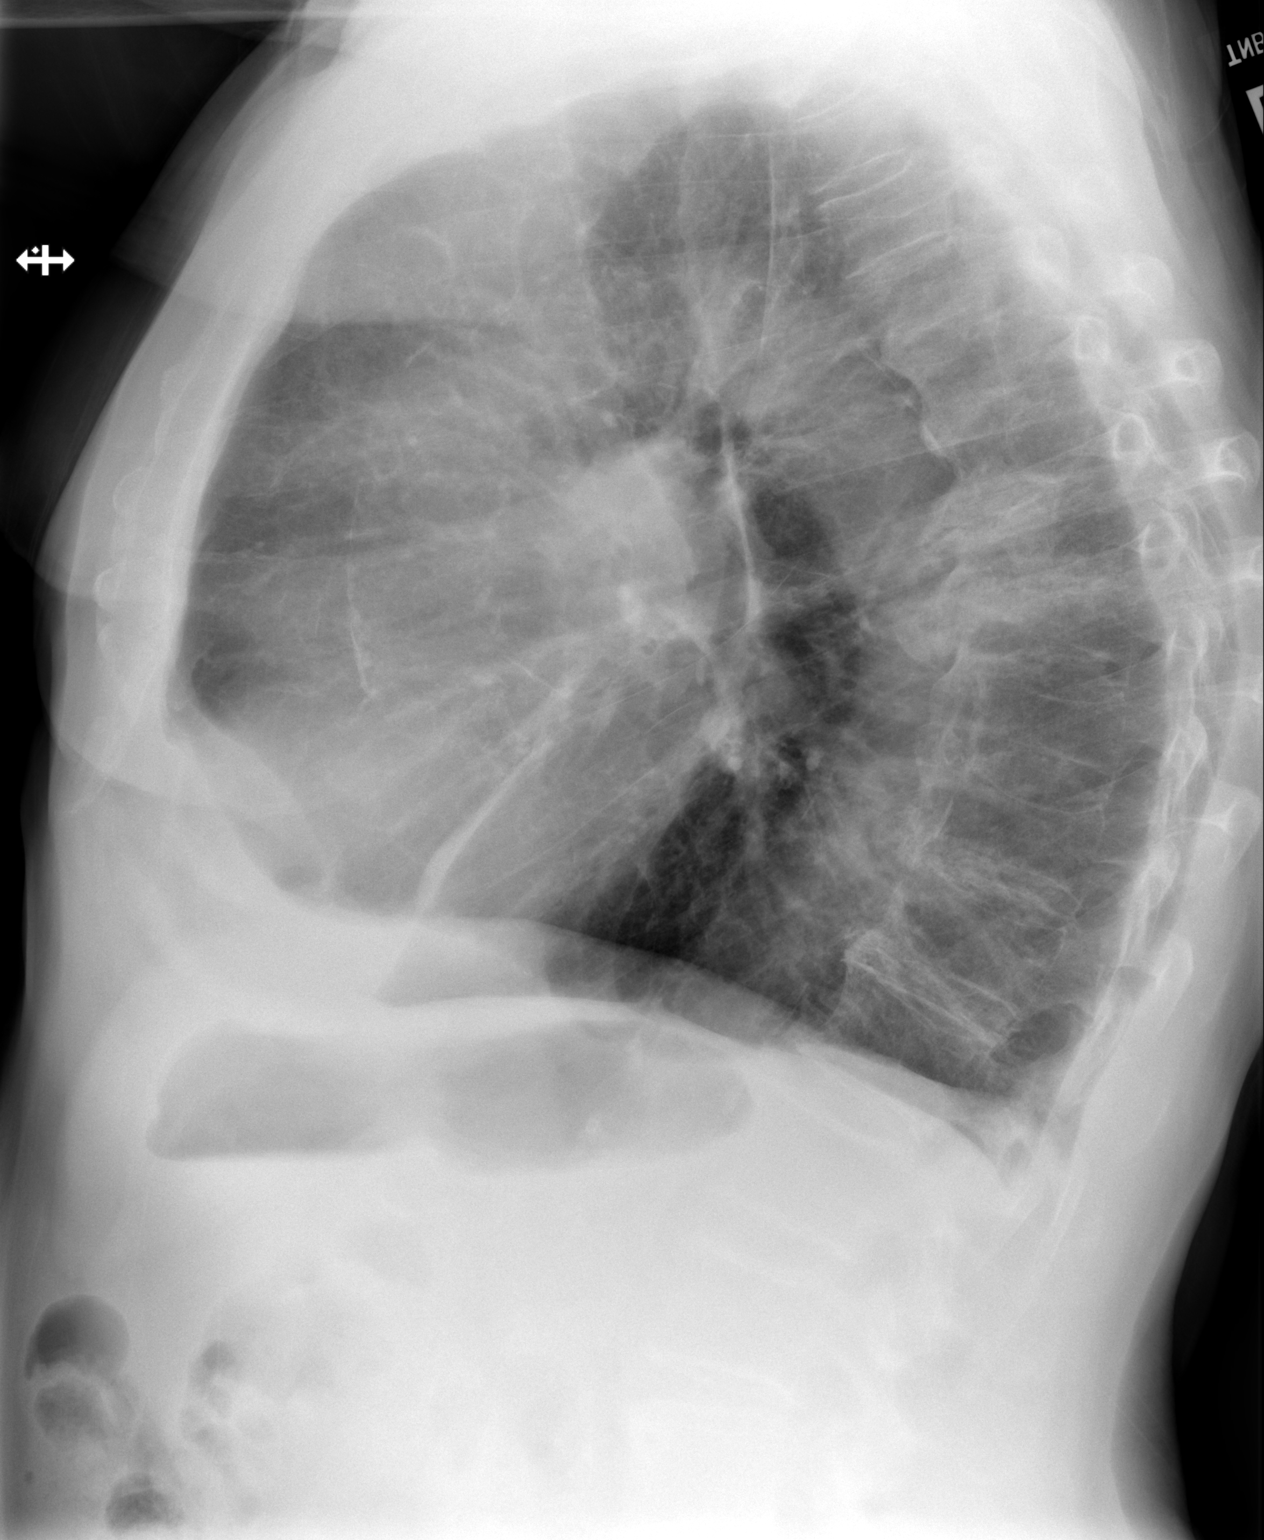

[2 of 2 positions shown; findings below may reference images not displayed]

FINDINGS: The lungs are clear without focal pneumonia, edema, pneumothorax or
pleural effusion. Hyperexpansion again noted. Cardiopericardial
silhouette is at upper limits of normal for size. The visualized
bony structures of the thorax show no acute abnormality.
Degenerative changes noted left shoulder.
IMPRESSION: Hyperinflation suggests emphysema. No active cardiopulmonary
disease.

## 2023-11-18 ENCOUNTER — Other Ambulatory Visit: Payer: Self-pay | Admitting: Primary Care

## 2024-02-18 ENCOUNTER — Other Ambulatory Visit: Payer: Self-pay | Admitting: Primary Care

## 2024-02-20 ENCOUNTER — Ambulatory Visit: Payer: Self-pay

## 2024-02-20 NOTE — Telephone Encounter (Signed)
 FYI Only or Action Required?: FYI only for provider.  Patient was last seen in primary care on 06/14/2022 by Meldon Sport, MD. Called Nurse Triage reporting Shortness of Breath. Symptoms began about a month ago. Interventions attempted: Prescription medications: Trilogy/emergency inhalers. Symptoms are: gradually worsening.  Triage Disposition: See PCP When Office is Open (Within 3 Days)  Patient/caregiver understands and will follow disposition?: Yes  **Patient scheduled for 6/20 with Rice Chamorro Huenink, Pt. Also has New Pt. Appt. with Pulmonology on 7/16.                  Reason for Disposition  [1] MODERATE longstanding difficulty breathing (e.g., speaks in phrases, SOB even at rest, pulse 100-120) AND [2] SAME as normal  Answer Assessment - Initial Assessment Questions 1. RESPIRATORY STATUS: Describe your breathing? (e.g., wheezing, shortness of breath, unable to speak, severe coughing)      Wife reports his lungs wont fill, he is gasping for air 2. ONSET: When did this breathing problem begin?      X1 month  3. PATTERN Does the difficult breathing come and go, or has it been constant since it started?      Gradual but constant  4. SEVERITY: How bad is your breathing? (e.g., mild, moderate, severe)    - MILD: No SOB at rest, mild SOB with walking, speaks normally in sentences, can lie down, no retractions, pulse < 100.    - MODERATE: SOB at rest, SOB with minimal exertion and prefers to sit, cannot lie down flat, speaks in phrases, mild retractions, audible wheezing, pulse 100-120.    - SEVERE: Very SOB at rest, speaks in single words, struggling to breathe, sitting hunched forward, retractions, pulse > 120      Moderate 5. RECURRENT SYMPTOM: Have you had difficulty breathing before? If Yes, ask: When was the last time? and What happened that time?      Yes  6. CARDIAC HISTORY: Do you have any history of heart disease? (e.g., heart attack, angina, bypass  surgery, angioplasty)      CHF 7. LUNG HISTORY: Do you have any history of lung disease?  (e.g., pulmonary embolus, asthma, emphysema)     Wife denies  8. CAUSE: What do you think is causing the breathing problem?    Unknown   9. OTHER SYMPTOMS: Do you have any other symptoms? (e.g., dizziness, runny nose, cough, chest pain, fever)     Cough  10. O2 SATURATION MONITOR:  Do you use an oxygen  saturation monitor (pulse oximeter) at home? If Yes, ask: What is your reading (oxygen  level) today? What is your usual oxygen  saturation reading? (e.g., 95%)       His current O2 level is 88% He has supplemental O2 at home in which he only wears at night. RN advised to put it on now to help with low O2 level. Now with O2 93%  Wife reports patient was seen a couple of years ago, she reports patient has not wanted to been seen, however his breathing is worsening over the paost month. Activity is exerting his energy and breath. The trilogy/ and emergency inhalers no longer working.  Protocols used: Breathing Difficulty-A-AH

## 2024-02-21 ENCOUNTER — Ambulatory Visit

## 2024-02-21 VITALS — BP 176/65 | HR 106 | Ht 71.0 in | Wt 176.0 lb

## 2024-02-21 DIAGNOSIS — J449 Chronic obstructive pulmonary disease, unspecified: Secondary | ICD-10-CM

## 2024-02-21 DIAGNOSIS — J441 Chronic obstructive pulmonary disease with (acute) exacerbation: Secondary | ICD-10-CM | POA: Diagnosis not present

## 2024-02-21 DIAGNOSIS — R0609 Other forms of dyspnea: Secondary | ICD-10-CM

## 2024-02-21 MED ORDER — TRELEGY ELLIPTA 200-62.5-25 MCG/ACT IN AEPB: 1.0000 | INHALATION_SPRAY | Freq: Every day | RESPIRATORY_TRACT | 0 refills | Status: DC

## 2024-02-21 MED ORDER — AZITHROMYCIN 250 MG PO TABS: ORAL_TABLET | ORAL | 0 refills | Status: AC

## 2024-02-21 MED ORDER — PREDNISONE 10 MG (21) PO TBPK: ORAL_TABLET | ORAL | 0 refills | Status: DC

## 2024-02-21 NOTE — Progress Notes (Addendum)
 Established Patient Office Visit  Subjective   Patient ID: Martin Ford, male    DOB: 1936/06/20  Age: 88 y.o. MRN: [id number]  Chief Complaint  Patient presents with   Medical Management of Chronic Issues    Pt states Real bad shortness of breath been getting worse over the last two weeks    HPI Cough: Patient complains of dyspnea.  Symptoms began 2 weeks ago.  The cough is non-productive, without wheezing, dyspnea or hemoptysis and is aggravated by nothing Associated symptoms include:shortness of breath and wheezing. Patient does not have new pets. Patient does not have a history of asthma. Patient does not have a history of environmental allergens. Patient has not recent travel. Patient does have a history of smoking. Patient  has previous Chest X-ray. Patient has not had a PPD done.  Patient Active Problem List   Diagnosis Date Noted   COPD with acute exacerbation (HCC) 02/23/2024   Refused influenza vaccine 06/14/2022   Pulmonary nodule 06/14/2022   Ascending aortic aneurysm (HCC) 04/18/2022   Exertional dyspnea 12/20/2021   Chronic respiratory failure with hypoxia (HCC) 12/20/2021   Stage 3 severe COPD by GOLD classification (HCC) 09/06/2021   Mixed hyperlipidemia 08/21/2021   Hypothyroidism 08/21/2021   Prediabetes 08/21/2021   PVD (peripheral vascular disease) (HCC) 08/21/2021   Centrilobular emphysema (HCC) 05/23/2021   Essential tremor 05/23/2021   Essential hypertension 05/23/2021   Vitamin D deficiency 05/23/2021      ROS    Objective:     BP (!) 176/65 Comment: 1st attempt wouldnt read on left arm this is second attempt on right arm  Pulse (!) 106   Ht 5' 11 (1.803 m)   Wt 176 lb (79.8 kg)   SpO2 92%   BMI 24.55 kg/m  BP Readings from Last 3 Encounters:  02/21/24 (!) 176/65  01/14/23 (!) 140/80  09/12/22 (!) 167/85   Wt Readings from Last 3 Encounters:  02/21/24 176 lb (79.8 kg)  01/14/23 176 lb (79.8 kg)  09/12/22 181 lb (82.1 kg)       Physical Exam Vitals and nursing note reviewed.  Constitutional:      Appearance: Normal appearance.   Eyes:     Extraocular Movements: Extraocular movements intact.     Pupils: Pupils are equal, round, and reactive to light.    Cardiovascular:     Rate and Rhythm: Normal rate and regular rhythm.  Pulmonary:     Effort: Pulmonary effort is normal.     Breath sounds: Wheezing and rhonchi present.   Musculoskeletal:     Cervical back: Normal range of motion and neck supple.   Neurological:     Mental Status: He is alert and oriented to person, place, and time.   Psychiatric:        Mood and Affect: Mood normal.        Thought Content: Thought content normal.      No results found for any visits on 02/21/24.    The ASCVD Risk score (Arnett DK, et al., 2019) failed to calculate for the following reasons:   The 2019 ASCVD risk score is only valid for ages 79 to 23    Assessment & Plan:   Problem List Items Addressed This Visit       Respiratory   Stage 3 severe COPD by GOLD classification [facility])   - Former smoker. Patient has chronic dyspnea and cough symptoms.  Will treat with oral steroids and antibiotic given acute exacerbation.  Trelegy also refilled.   He has an upcoming appt with pulmonary, Dr. Darlean, on 7/16.   Recommend ER evaluation if symptoms are not improving before then.        Relevant Medications   predniSONE  (STERAPRED UNI-PAK 21 TAB) 10 MG (21) TBPK tablet   azithromycin  (ZITHROMAX ) 250 MG tablet   Fluticasone-Umeclidin-Vilant (TRELEGY ELLIPTA ) 200-62.5-25 MCG/ACT AEPB   COPD with acute exacerbation (HCC) - Primary   - Former smoker. Patient has chronic dyspnea and cough symptoms.  Will treat with oral steroids and antibiotic given acute exacerbation.   Trelegy also refilled.   He has an upcoming appt with pulmonary, Dr. Darlean, on 7/16.   Recommend ER evaluation if symptoms are not improving before then      Relevant Medications    predniSONE  (STERAPRED UNI-PAK 21 TAB) 10 MG (21) TBPK tablet   azithromycin  (ZITHROMAX ) 250 MG tablet   Fluticasone-Umeclidin-Vilant (TRELEGY ELLIPTA ) 200-62.5-25 MCG/ACT AEPB    No follow-ups on file.    Leita Longs, FNP

## 2024-02-23 DIAGNOSIS — J441 Chronic obstructive pulmonary disease with (acute) exacerbation: Secondary | ICD-10-CM | POA: Insufficient documentation

## 2024-02-23 NOTE — Assessment & Plan Note (Signed)
-   Former smoker. Patient has chronic dyspnea and cough symptoms.  Will treat with oral steroids and antibiotic given acute exacerbation.   Trelegy also refilled.   He has an upcoming appt with pulmonary, Dr. Darlean, on 7/16.   Recommend ER evaluation if symptoms are not improving before then

## 2024-03-18 ENCOUNTER — Encounter: Payer: Self-pay | Admitting: Internal Medicine

## 2024-03-18 ENCOUNTER — Ambulatory Visit (HOSPITAL_COMMUNITY)
Admission: RE | Admit: 2024-03-18 | Discharge: 2024-03-18 | Disposition: A | Discharge status: Home / Self Care | Source: Ambulatory Visit | Attending: Internal Medicine | Admitting: Internal Medicine

## 2024-03-18 ENCOUNTER — Ambulatory Visit: Admitting: Internal Medicine

## 2024-03-18 VITALS — BP 148/82 | HR 82 | Ht 71.0 in | Wt 160.0 lb

## 2024-03-18 DIAGNOSIS — J449 Chronic obstructive pulmonary disease, unspecified: Secondary | ICD-10-CM

## 2024-03-18 DIAGNOSIS — R0609 Other forms of dyspnea: Secondary | ICD-10-CM | POA: Diagnosis not present

## 2024-03-18 MED ORDER — PREDNISONE 10 MG PO TABS: ORAL_TABLET | ORAL | 0 refills | Status: DC

## 2024-03-18 MED ORDER — FAMOTIDINE 20 MG PO TABS: ORAL_TABLET | ORAL | 11 refills | Status: DC

## 2024-03-18 MED ORDER — IPRATROPIUM-ALBUTEROL 0.5-2.5 (3) MG/3ML IN SOLN: 3.0000 mL | Freq: Four times a day (QID) | RESPIRATORY_TRACT | 11 refills | Status: DC | PRN

## 2024-03-18 MED ORDER — PANTOPRAZOLE SODIUM 40 MG PO TBEC: 40.0000 mg | DELAYED_RELEASE_TABLET | Freq: Every day | ORAL | 2 refills | Status: AC

## 2024-03-18 NOTE — Assessment & Plan Note (Signed)
 Quit smoking 1973 s obvious sequelae - PFT's  08/31/21  FEV1 0.95 (38 % ) ratio 0.48  p 21 % improvement from saba p Trelegy prior to study with DLCO  15.5 (68%)   and FV curve classically concave - Echo 01/02/23  Mod AS with Mild LAE  nl ef    DDX of  difficult airways management almost all start with A and  include Adherence, Ace Inhibitors, Acid Reflux, Active Sinus Disease, Alpha 1 Antitripsin deficiency, Anxiety masquerading as Airways dz,  ABPA,  Allergy(esp in young), Aspiration (esp in elderly), Adverse effects of meds,  Active smoking or vaping, A bunch of PE's (a small clot burden can't cause this syndrome unless there is already severe underlying pulm or vascular dz with poor reserve) plus two Bs  = Bronchiectasis and Beta blocker use..and one C= CHF   No overt chf with no desats and most impressive finding = dry upper airway cough so ddx includes allergy and GERD and adverse side effects from meds = DPI irritating the upper airway so rec   1) try off dpi and use duoneb qid instead for now 2) Prednisone  x 8 day taper 3) max gerd rx  4) mucinex dm prn   If no better may need admit to sort out - if improving can f/u once finishes prednisone  taper to regroup and ? Add budesonide to neb rx   Discussed in detail all the  indications, usual  risks and alternatives  relative to the benefits with patient who agrees to proceed with Rx as outlined.      Each maintenance medication was reviewed in detail including emphasizing most importantly the difference between maintenance and prns and under what circumstances the prns are to be triggered using an action plan format where appropriate.  Total time for H and P, chart review, counseling, reviewing neb device(s) and generating customized AVS unique to this office visit / same day charting = 40 min Acute eval for pt new to me with high level decision making / dx of unclear etiology.

## 2024-03-18 NOTE — Patient Instructions (Addendum)
 Pantoprazole  (protonix ) 40 mg   Take  30-60 min before first meal of the day and Pepcid  (famotidine )  20 mg after supper until return to office - this is the best way to tell whether stomach acid is contributing to your problem.    GERD (REFLUX)  is an extremely common cause of respiratory symptoms just like yours , many times with no obvious heartburn at all.    It can be treated with medication, but also with lifestyle changes including elevation of the head of your bed (ideally with 6 -8inch blocks under the headboard of your bed),  Smoking cessation, avoidance of late meals, excessive alcohol, and avoid fatty foods, chocolate, peppermint, colas, red wine, and acidic juices such as orange juice.  NO MINT OR MENTHOL PRODUCTS SO NO COUGH DROPS  USE SUGARLESS CANDY INSTEAD (Jolley ranchers or Stover's or Life Savers) or even ice chips will also do - the key is to swallow to prevent all throat clearing. NO OIL BASED VITAMINS - use powdered substitutes.  Avoid fish oil when coughing.   Stop trelegy   For short of breath Use nebulizer up to 4 x daily    For cough / congestions  > mucinex dm 1200 mg every 12 hours as needed   Prednisone  10 mg : Take 4 for two days three for two days two for two days one for two days   Please remember to go to the  x-ray department  @  [facility] for your tests - we will call you with the results when they are available     If condition worsens go to ER   Please schedule a follow up office visit in 2 weeks, sooner if needed  with all medications /inhalers/ solutions in hand so we can verify exactly what you are taking. This includes all medications from all doctors and over the counters

## 2024-03-18 NOTE — Progress Notes (Unsigned)
 Martin Ford, male    DOB: 1936/07/22    MRN: [id number]   Brief patient profile:  50  yowm quit 1973  sood Pt self referred to pulmonary clinic in [place]  03/18/2024  for progressive doe     History of Present Illness  03/18/2024  Pulmonary/ 1st Ford eval/ Martin Ford / Martin Ford  Chief Complaint  Patient presents with   Acute Visit    Increased SOB over the past month. He has had some wheezing and increased cough- pred taper taken recently helped some. Cough is non prod.   Dyspnea:  with minimal activity /  some better p prednisone  Cough: better p abx and prednisone  still clearing throat. Sleep: flat bed several  pillow  SABA use: hfa not working / did not try neb  02: not using     No obvious day to day or daytime pattern/variability or assoc excess/ purulent sputum or mucus plugs or hemoptysis or cp or chest tightness,  or overt sinus or hb symptoms.    Also denies any obvious fluctuation of symptoms with weather or environmental changes or other aggravating or alleviating factors except as outlined above   No unusual exposure hx or h/o childhood pna/ asthma or knowledge of premature birth.  Current Allergies, Complete Past Medical History, Past Surgical History, Family History, and Social History were reviewed in [employer] record.  ROS  The following are not active complaints unless bolded Hoarseness, sore throat, dysphagia, dental problems, itching, sneezing,  nasal congestion or discharge of excess mucus or purulent secretions, ear ache,   fever, chills, sweats, unintended wt loss or wt gain, classically pleuritic or exertional cp,  orthopnea pnd or arm/hand swelling  or leg swelling, presyncope, palpitations, abdominal pain, anorexia, nausea, vomiting, diarrhea  or change in bowel habits or change in bladder habits, change in stools or change in urine, dysuria, hematuria,  rash, arthralgias, visual complaints, headache, numbness, weakness or  ataxia or problems with walking or coordination s/p cva,  change in mood or  memory.            Outpatient Medications Prior to Visit  Medication Sig Dispense Refill   albuterol  (PROVENTIL ) (2.5 MG/3ML) 0.083% nebulizer solution USE 1 VIAL VIA NEBULIZER EVERY 6 HOURS AS NEEDED FOR WHEEZING OR  SHORTNESS OF BREATH 300 mL 13   albuterol  (VENTOLIN  HFA) 108 (90 Base) MCG/ACT inhaler USE 2 INHALATIONS BY MOUTH EVERY 6 HOURS AS NEEDED FOR WHEEZING  OR SHORTNESS OF BREATH 34 g 2   Fluticasone-Umeclidin-Vilant (TRELEGY ELLIPTA ) 200-62.5-25 MCG/ACT AEPB Take 1 Inhalation by mouth daily. 180 each 0   Misc. Devices MISC Blood pressure cuff/device - 1. ICD10: I10 1 each 0   atenolol  (TENORMIN ) 50 MG tablet TAKE 1 TABLET BY MOUTH IN THE  MORNING (Patient not taking: Reported on 03/18/2024) 100 tablet 2   predniSONE  (STERAPRED UNI-PAK 21 TAB) 10 MG (21) TBPK tablet Take according to package instructions 21 tablet 0   No facility-administered medications prior to visit.    Past Medical History:  Diagnosis Date   Emphysema of lung (HCC)    Essential tremor    Hypertension       Objective:     BP (!) 148/82 (BP Location: Left Arm, Cuff Size: Normal)   Pulse 82   Ht 5' 11 (1.803 m)   Wt 160 lb (72.6 kg)   SpO2 95% Comment: on RA  BMI 22.32 kg/m   SpO2: 95 % (on RA)  Wt Readings  from Last 3 Encounters:  03/18/24 160 lb (72.6 kg)  02/21/24 176 lb (79.8 kg)  01/14/23 176 lb (79.8 kg)     Frail elderly wc bound wm mild increased wob at rest with harsh hacking dry cough and throat clearing    HEENT : Oropharynx  clear   Nasal turbinates nl    NECK :  without  apparent JVD/ palpable Nodes/TM    LUNGS: no acc muscle use,  Min barrel/ mild kyphotic   contour chest wall with bilateral  slightly decreased bs s audible wheeze and  without cough on insp or exp maneuvers and min  Hyperresonant  to  percussion bilaterally    CV:  RRR  no s3   2-3/6 sem reduced S2  s increase in P2, and no  edema   ABD:  soft and nontender    MS:   ext warm without deformities Or obvious joint restrictions  calf tenderness, cyanosis or clubbing     SKIN: warm and dry without lesions    NEURO:  alert, approp, nl sensorium with  no motor or cerebellar deficits apparent.          CXR PA and Lateral:   03/18/2024 :    I personally reviewed images and impression is as follows:     Mild copd/ cm s chf/ mild kyphosis   Assessment   No problem-specific Assessment & Plan notes found for this encounter.     Martin America, MD 03/18/2024

## 2024-03-22 ENCOUNTER — Encounter (HOSPITAL_COMMUNITY): Payer: Self-pay | Admitting: Emergency Medicine

## 2024-03-22 ENCOUNTER — Emergency Department (HOSPITAL_COMMUNITY)
Admission: EM | Admit: 2024-03-22 | Discharge: 2024-03-22 | Disposition: A | Discharge status: Home / Self Care | Attending: Emergency Medicine | Admitting: Emergency Medicine

## 2024-03-22 ENCOUNTER — Other Ambulatory Visit: Payer: Self-pay

## 2024-03-22 ENCOUNTER — Emergency Department (HOSPITAL_COMMUNITY)

## 2024-03-22 DIAGNOSIS — R6889 Other general symptoms and signs: Secondary | ICD-10-CM | POA: Diagnosis not present

## 2024-03-22 DIAGNOSIS — Z743 Need for continuous supervision: Secondary | ICD-10-CM | POA: Diagnosis not present

## 2024-03-22 DIAGNOSIS — R059 Cough, unspecified: Secondary | ICD-10-CM | POA: Diagnosis not present

## 2024-03-22 DIAGNOSIS — I700 Atherosclerosis of aorta: Secondary | ICD-10-CM | POA: Diagnosis not present

## 2024-03-22 DIAGNOSIS — J40 Bronchitis, not specified as acute or chronic: Secondary | ICD-10-CM | POA: Diagnosis not present

## 2024-03-22 DIAGNOSIS — J449 Chronic obstructive pulmonary disease, unspecified: Secondary | ICD-10-CM | POA: Insufficient documentation

## 2024-03-22 DIAGNOSIS — R918 Other nonspecific abnormal finding of lung field: Secondary | ICD-10-CM | POA: Diagnosis not present

## 2024-03-22 DIAGNOSIS — M129 Arthropathy, unspecified: Secondary | ICD-10-CM | POA: Diagnosis not present

## 2024-03-22 DIAGNOSIS — I499 Cardiac arrhythmia, unspecified: Secondary | ICD-10-CM | POA: Diagnosis not present

## 2024-03-22 DIAGNOSIS — R0902 Hypoxemia: Secondary | ICD-10-CM | POA: Diagnosis not present

## 2024-03-22 DIAGNOSIS — R040 Epistaxis: Secondary | ICD-10-CM | POA: Insufficient documentation

## 2024-03-22 DIAGNOSIS — Z7951 Long term (current) use of inhaled steroids: Secondary | ICD-10-CM | POA: Diagnosis not present

## 2024-03-22 DIAGNOSIS — R58 Hemorrhage, not elsewhere classified: Secondary | ICD-10-CM | POA: Diagnosis not present

## 2024-03-22 DIAGNOSIS — J309 Allergic rhinitis, unspecified: Secondary | ICD-10-CM | POA: Diagnosis not present

## 2024-03-22 MED ORDER — OXYMETAZOLINE HCL 0.05 % NA SOLN
1.0000 | Freq: Once | NASAL | Status: AC
  Administered 2024-03-22: 1 via NASAL
  Filled 2024-03-22: qty 30

## 2024-03-22 MED ORDER — DOXYCYCLINE HYCLATE 100 MG PO CAPS: 100.0000 mg | ORAL_CAPSULE | Freq: Two times a day (BID) | ORAL | 0 refills | Status: DC

## 2024-03-22 NOTE — Discharge Instructions (Signed)
 Follow-up with your doctor later this week for recheck.  Go ahead and start the antibiotics tomorrow

## 2024-03-22 NOTE — ED Notes (Signed)
 Patient switched to 4 L [place] (humidified) for patient comfort.  RT and MD aware.

## 2024-03-22 NOTE — ED Triage Notes (Signed)
 Pt c/o nosebleed. Pt is normally on 4lpm of oxygen . Pt was 86% when ems got in truck.

## 2024-03-23 ENCOUNTER — Ambulatory Visit: Payer: Self-pay | Admitting: Internal Medicine

## 2024-03-23 NOTE — Progress Notes (Signed)
 Spoke with pt regarding chest xray, confirmed understanding NFN

## 2024-03-24 NOTE — ED Provider Notes (Signed)
 [place] EMERGENCY DEPARTMENT AT [facility] Provider Note   CSN: [id number] Arrival date & time: 03/22/24  8044     Patient presents with: Epistaxis   Martin Ford is a 88 y.o. male.   Patient complains of with nosebleed and a cough.  He uses nasal oxygen  and has COPD  The history is provided by the patient and medical records. No language interpreter was used.  Epistaxis Location:  R nare Severity:  Mild Timing:  Intermittent Progression:  Resolved Chronicity:  New Context: not anticoagulants   Relieved by:  Nothing Worsened by:  Nothing Associated symptoms: cough   Associated symptoms: no congestion and no headaches        Prior to Admission medications   Medication Sig Start Date End Date Taking? Authorizing Provider  doxycycline  (VIBRAMYCIN ) 100 MG capsule Take 1 capsule (100 mg total) by mouth 2 (two) times daily. One po bid x 7 days 03/22/24  Yes Alleya Demeter, MD  albuterol  (VENTOLIN  HFA) 108 (90 Base) MCG/ACT inhaler USE 2 INHALATIONS BY MOUTH EVERY 6 HOURS AS NEEDED FOR WHEEZING  OR SHORTNESS OF BREATH 10/08/23   Tobie Suzzane POUR, MD  famotidine  (PEPCID ) 20 MG tablet One after supper 03/18/24   Darlean Ozell NOVAK, MD  ipratropium-albuterol  (DUONEB) 0.5-2.5 (3) MG/3ML SOLN Take 3 mLs by nebulization every 6 (six) hours as needed. 03/18/24   Darlean Ozell NOVAK, MD  Misc. Devices MISC Blood pressure cuff/device - 1. ICD10: I10 06/14/22   Tobie Suzzane POUR, MD  pantoprazole  (PROTONIX ) 40 MG tablet Take 1 tablet (40 mg total) by mouth daily. Take 30-60 min before first meal of the day 03/18/24   Darlean Ozell NOVAK, MD  predniSONE  (DELTASONE ) 10 MG tablet Take 4 for two days three for two days two for two days one for two days 03/18/24   Darlean Ozell NOVAK, MD    Allergies: Patient has no known allergies.    Review of Systems  Constitutional:  Negative for appetite change and fatigue.  HENT:  Positive for nosebleeds. Negative for congestion, ear discharge and sinus  pressure.   Eyes:  Negative for discharge.  Respiratory:  Positive for cough.   Cardiovascular:  Negative for chest pain.  Gastrointestinal:  Negative for abdominal pain and diarrhea.  Genitourinary:  Negative for frequency and hematuria.  Musculoskeletal:  Negative for back pain.  Skin:  Negative for rash.  Neurological:  Negative for seizures and headaches.  Psychiatric/Behavioral:  Negative for hallucinations.     Updated Vital Signs BP (!) 155/69 (BP Location: Right Arm)   Pulse 90   Temp 97.8 F (36.6 C) (Axillary)   Resp (!) 25   Ht 5' 11 (1.803 m)   Wt 72.6 kg   SpO2 97%   BMI 22.32 kg/m   Physical Exam Vitals and nursing note reviewed.  Constitutional:      Appearance: He is well-developed.  HENT:     Head: Normocephalic.     Nose: Nose normal.  Eyes:     General: No scleral icterus.    Conjunctiva/sclera: Conjunctivae normal.  Neck:     Thyroid : No thyromegaly.  Cardiovascular:     Rate and Rhythm: Normal rate and regular rhythm.     Heart sounds: No murmur heard.    No friction rub. No gallop.  Pulmonary:     Breath sounds: No stridor. No wheezing or rales.  Chest:     Chest wall: No tenderness.  Abdominal:     General: There is  no distension.     Tenderness: There is no abdominal tenderness. There is no rebound.  Musculoskeletal:        General: Normal range of motion.     Cervical back: Neck supple.  Lymphadenopathy:     Cervical: No cervical adenopathy.  Skin:    Findings: No erythema or rash.  Neurological:     Mental Status: He is alert and oriented to person, place, and time.     Motor: No abnormal muscle tone.     Coordination: Coordination normal.  Psychiatric:        Behavior: Behavior normal.     (all labs ordered are listed, but only abnormal results are displayed) Labs Reviewed - No data to display  EKG: None  Radiology: [facility] Chest Port 1 View Result Date: 03/22/2024 CLINICAL DATA:  Cough. EXAM: PORTABLE CHEST 1 VIEW  COMPARISON:  Radiograph 03/18/2024, CT 07/25/2022 FINDINGS: The lungs are hyperinflated with coarse lung markings and central bronchial thickening. Stable heart size and mediastinal contours. Aortic atherosclerosis. Left lung apex is not included in the field of view. No confluent airspace disease, significant pleural effusion or pneumothorax. Chronic left shoulder arthropathy. IMPRESSION: Hyperinflation with coarse lung markings and central bronchial thickening, likely chronic bronchitis. No acute findings. Electronically Signed   By: Andrea Gasman M.D.   On: 03/22/2024 21:34     Procedures   Medications Ordered in the ED  oxymetazoline  (AFRIN) 0.05 % nasal spray 1 spray (1 spray Each Nare Given 03/22/24 2124)                                    Medical Decision Making Amount and/or Complexity of Data Reviewed Radiology: ordered.  Risk OTC drugs. Prescription drug management.   Patient with bronchitis.  He is started on doxycycline .  He also has a nosebleed in the right nasal area.  Most likely related to the constant nasal oxygen .  The bleeding is stopped.  He is given Afrin if he has any more problems and will follow-up with his PCP     Final diagnoses:  Epistaxis  Bronchitis    ED Discharge Orders          Ordered    doxycycline  (VIBRAMYCIN ) 100 MG capsule  2 times daily        03/22/24 2214               Suzette Pac, MD 03/24/24 1250

## 2024-03-31 NOTE — Progress Notes (Unsigned)
 Martin Ford, male    DOB: [birth year]    MRN: [id number]   Brief patient profile:  43  yowm quit smoking in 1973  Sood Pt self referred to pulmonary clinic in [place]  03/18/2024  for progressive doe  x one month over a baseline of doe x 100 ft @ Sood's last ov 07/18/21 and GOLD 3 COPD  criteria 08/31/21 and mod AS with Mild LAE  01/02/23    History of Present Illness  03/18/2024  Pulmonary/ 1st office eval/ Cherrish Vitali / [facility]  Chief Complaint  Patient presents with   Acute Visit    Increased SOB over the past month. He has had some wheezing and increased cough- pred taper taken recently helped some. Cough is non prod.   Dyspnea:  with minimal activity /  some better p prednisone  Cough: better p abx and prednisone  still clearing throat. Sleep: flat bed several  pillow  SABA use: hfa not working / did not try neb  02: not using  Rec Pantoprazole  (protonix ) 40 mg   Take  30-60 min before first meal of the day and Pepcid  (famotidine )  20 mg after supper until return to office -    GERD diet reviewed, bed blocks rec  Stop trelegy  For short of breath Use nebulizer up to 4 x daily  For cough / congestions  > mucinex dm 1200 mg every 12 hours as needed  Prednisone  10 mg : Take 4 for two days three for two days two for two days one for two days  Please remember to go to the  x-ray department  @  [facility] for your tests - we will call you with the results when they are available    Cxr 03/18/24 Chronic hyperinflation and central bronchial thickening. No acute findings.  Please schedule a follow up office visit in 2 weeks, sooner if needed  with all medications /inhalers/ solutions in hand    04/02/2024  f/u ov/[place] office/Amayiah Gosnell re: GOLD 3 COPD   maint on no resp rx  did not  bring all meds and has not used duoneb on day of ov - trelegy on hold due to cough which did improve off it.  Chief Complaint  Patient presents with   Follow-up  Dyspnea:  w/c most of the time  due to weakness / djd > sob  Cough: dry sounding now  Sleeping: flat bed bed one pillow s resp cc  SABA use: not using  02: uses prn      No obvious day to day or daytime variability or assoc excess/ purulent sputum or mucus plugs or hemoptysis or cp or chest tightness, subjective wheeze or overt  hb symptoms.    Also denies any obvious fluctuation of symptoms with weather or environmental changes or other aggravating or alleviating factors except as outlined above   No unusual exposure hx or h/o childhood pna/ asthma or knowledge of premature birth.  Current Allergies, Complete Past Medical History, Past Surgical History, Family History, and Social History were reviewed in [employer] record.  ROS  The following are not active complaints unless bolded Hoarseness, sore throat, dysphagia, dental problems, itching, sneezing,  nasal congestion or discharge of excess mucus or purulent secretions, ear ache,   fever, chills, sweats, unintended wt loss or wt gain, classically pleuritic or exertional cp,  orthopnea pnd or arm/hand swelling  or leg swelling, presyncope, palpitations, abdominal pain, anorexia, nausea, vomiting, diarrhea  or change in  bowel habits or change in bladder habits, change in stools or change in urine, dysuria, hematuria,  rash, arthralgias, visual complaints, headache, numbness, weakness or ataxia or problems with walking or coordination,  change in mood or  memory.        Current Meds - - NOTE:   Unable to verify as accurately reflecting what pt takes    Medication Sig   albuterol  (VENTOLIN  HFA) 108 (90 Base) MCG/ACT inhaler USE 2 INHALATIONS BY MOUTH EVERY 6 HOURS AS NEEDED FOR WHEEZING  OR SHORTNESS OF BREATH        famotidine  (PEPCID ) 20 MG tablet One after supper   ipratropium-albuterol  (DUONEB) 0.5-2.5 (3) MG/3ML SOLN Take 3 mLs by nebulization every 6 (six) hours as needed.   Misc. Devices MISC Blood pressure cuff/device - 1. ICD10: I10    pantoprazole  (PROTONIX ) 40 MG tablet Take 1 tablet (40 mg total) by mouth daily. Take 30-60 min before first meal of the day                 Past Medical History:  Diagnosis Date   Emphysema of lung (HCC)    Essential tremor    Hypertension       Objective:     04/02/2024        160   03/18/24 160 lb (72.6 kg)  02/21/24 176 lb (79.8 kg)  01/14/23 176 lb (79.8 kg)     Vital signs reviewed  04/02/2024  - Note at rest 02 sats  96% on RA   General appearance:    w/c bound frail elderly wm nad but can't get up to walk s assistance   HEENT : Oropharynx  clear  Nasal turbinates nl    NECK :  without  apparent JVD/ palpable Nodes/TM    LUNGS: no acc muscle use,  Mild barrel/mildly kyphotic  contour chest wall with bilateral  Distant bs s audible wheeze and  without cough on insp or exp maneuvers  and mild  Hyperresonant  to  percussion bilaterally     CV:  RRR  no s3   2/6 SEM s  increase in P2, and Trace edema both LE's  ABD:  soft and nontender with pos end  insp Hoover's  in the supine position.  No bruits or organomegaly appreciated   MS:  Nl gait/ ext warm without deformities Or obvious joint restrictions  calf tenderness, cyanosis or clubbing     SKIN: warm and dry without lesions    NEURO:  alert, approp, nl sensorium with  no motor or cerebellar deficits apparent.     I personally reviewed images and agree with radiology impression as follows:  CXR:   portable 03/22/24 Hyperinflation with coarse lung markings and central bronchial thickening, likely chronic bronchitis. No acute findings.    Assessment

## 2024-04-02 ENCOUNTER — Encounter: Payer: Self-pay | Admitting: Internal Medicine

## 2024-04-02 ENCOUNTER — Ambulatory Visit: Admitting: Internal Medicine

## 2024-04-02 VITALS — BP 172/63 | HR 87 | Ht 71.0 in | Wt 160.0 lb

## 2024-04-02 DIAGNOSIS — J449 Chronic obstructive pulmonary disease, unspecified: Secondary | ICD-10-CM

## 2024-04-02 DIAGNOSIS — R0609 Other forms of dyspnea: Secondary | ICD-10-CM

## 2024-04-02 NOTE — Assessment & Plan Note (Signed)
 Quit smoking 1973 s obvious sequelae - PFT's  08/31/21  FEV1 0.95 (38 % ) ratio 0.48  p 21 % improvement from saba p Trelegy prior to study with DLCO  15.5 (68%)   and FV curve classically concave - Echo 01/02/23  Mod AS with Mild LAE  nl ef    Cough is better off trelegy and he is so sedentary at this point that I don't really think do is his limiting problem but suggested he time duoneb 15 min before activity to see if makes any difference  In terms of 02 rec Make sure you check your oxygen  saturation  AT  your highest level of activity (not after you stop)   to be sure it stays over 90% and adjust  02 flow upward to maintain this level if needed but remember to turn it back to previous settings when you stop (to conserve your supply).   For cough use mucinex dm 2 bid prn   For AS  > f/u with cards since overdue for yearly f/u  but reminded him and wife the symptoms of AS overlap with copd (minus the cough) and hard to tell the difference.   DOE labs pending          Each maintenance medication was reviewed in detail including emphasizing most importantly the difference between maintenance and prns and under what circumstances the prns are to be triggered using an action plan format where appropriate.  Total time for H and P, chart review, counseling, reviewing neb/ 02 / pulse ox  device(s) and generating customized AVS unique to this office visit / same day charting = 35 min

## 2024-04-02 NOTE — Patient Instructions (Addendum)
 Pantoprazole  (protonix ) 40 mg   Take  30-60 min before first meal of the day and Pepcid  (famotidine )  20 mg after supper until return to office - this is the best way to tell whether stomach acid is contributing to your problem.    GERD (REFLUX)  is an extremely common cause of respiratory symptoms just like yours , many times with no obvious heartburn at all.    It can be treated with medication, but also with lifestyle changes including elevation of the head of your bed (ideally with 6 -8inch blocks under the headboard of your bed),  Smoking cessation, avoidance of late meals, excessive alcohol, and avoid fatty foods, chocolate, peppermint, colas, red wine, and acidic juices such as orange juice.  NO MINT OR MENTHOL PRODUCTS SO NO COUGH DROPS  USE SUGARLESS CANDY INSTEAD (Jolley ranchers or Stover's or Life Savers) or even ice chips will also do - the key is to swallow to prevent all throat clearing. NO OIL BASED VITAMINS - use powdered substitutes.  Avoid fish oil when coughing.      For short of breath continue to Use nebulizer up to 4 x daily if needed for short of breath  For cough / congestions  > mucinex dm 1200 mg every 12 hours as needed   Continue 02 2lpm at bedtime and as needed with activity during the day to keep levels above 90% at all times   See your cardiologist   Please remember to go to the lab department   for your tests - we will call you with the results when they are available.      Please schedule a follow up office visit in 6 weeks, call sooner if needed with all medications /inhalers/ solutions in hand so we can verify exactly what you are taking. This includes all medications from all doctors and over the counters

## 2024-04-23 ENCOUNTER — Other Ambulatory Visit: Payer: Self-pay

## 2024-04-23 DIAGNOSIS — J449 Chronic obstructive pulmonary disease, unspecified: Secondary | ICD-10-CM

## 2024-05-13 ENCOUNTER — Ambulatory Visit: Payer: Self-pay

## 2024-05-13 NOTE — Telephone Encounter (Signed)
 FYI Only or Action Required?: Action required by provider: update on patient condition.  Patient was last seen in primary care on 02/21/2024 by Bevely Doffing, FNP.  Called Nurse Triage reporting Cough.  Symptoms began yesterday.  Interventions attempted: Rest, hydration, or home remedies.  Symptoms are: gradually worsening. 2 days ago pt. Seems worse after antibiotic and Prednisone . More SOB, shaky, not walking around like he was doing. Per wife. More cough and wheezing. Too weak to get in a car, will call EMS for transportation.  Triage Disposition: Go to ED Now (or PCP Triage)  Patient/caregiver understands and will follow disposition?: Yes   Copied from CRM #[id number]. Topic: Clinical - Red Word Triage >> May 13, 2024  3:29 PM Russell PARAS wrote: Red Word that prompted transfer to Nurse Triage:   Woke up this morning and having a hard time catching his breath Began having cough a couple of days Wheezing and cough intermittently  Using oxygen   Pt Dr. Darlean Reason for Disposition  Patient sounds very sick or weak to the triager  Answer Assessment - Initial Assessment Questions 1. ONSET: When did the cough begin?      2 days ago 2. SEVERITY: How bad is the cough today?      severe 3. SPUTUM: Describe the color of your sputum (e.g., none, dry cough; clear, white, yellow, green)     unsure 4. HEMOPTYSIS: Are you coughing up any blood? If Yes, ask: How much? (e.g., flecks, streaks, tablespoons, etc.)     no 5. DIFFICULTY BREATHING: Are you having difficulty breathing? If Yes, ask: How bad is it? (e.g., mild, moderate, severe)      moderate 6. FEVER: Do you have a fever? If Yes, ask: What is your temperature, how was it measured, and when did it start?     no 7. CARDIAC HISTORY: Do you have any history of heart disease? (e.g., heart attack, congestive heart failure)      yes 8. LUNG HISTORY: Do you have any history of lung disease?  (e.g., pulmonary embolus,  asthma, emphysema)     yes 9. PE RISK FACTORS: Do you have a history of blood clots? (or: recent major surgery, recent prolonged travel, bedridden)     no 10. OTHER SYMPTOMS: Do you have any other symptoms? (e.g., runny nose, wheezing, chest pain)       wheezing 11. PREGNANCY: Is there any chance you are pregnant? When was your last menstrual period?       N/a 12. TRAVEL: Have you traveled out of the country in the last month? (e.g., travel history, exposures)       no  Protocols used: Cough - Acute Non-Productive-A-AH

## 2024-05-14 ENCOUNTER — Ambulatory Visit: Admitting: Internal Medicine

## 2024-05-14 ENCOUNTER — Encounter: Payer: Self-pay | Admitting: Internal Medicine

## 2024-05-14 VITALS — BP 167/76 | HR 107 | Ht 71.0 in | Wt 153.2 lb

## 2024-05-14 DIAGNOSIS — J449 Chronic obstructive pulmonary disease, unspecified: Secondary | ICD-10-CM

## 2024-05-14 DIAGNOSIS — R0609 Other forms of dyspnea: Secondary | ICD-10-CM | POA: Diagnosis not present

## 2024-05-14 DIAGNOSIS — Z87891 Personal history of nicotine dependence: Secondary | ICD-10-CM | POA: Diagnosis not present

## 2024-05-14 DIAGNOSIS — J9611 Chronic respiratory failure with hypoxia: Secondary | ICD-10-CM

## 2024-05-14 MED ORDER — PREDNISONE 10 MG PO TABS: ORAL_TABLET | ORAL | 0 refills | Status: DC

## 2024-05-14 MED ORDER — DOXYCYCLINE HYCLATE 100 MG PO TABS: 100.0000 mg | ORAL_TABLET | Freq: Two times a day (BID) | ORAL | 0 refills | Status: DC

## 2024-05-14 MED ORDER — IPRATROPIUM-ALBUTEROL 0.5-2.5 (3) MG/3ML IN SOLN: 3.0000 mL | Freq: Four times a day (QID) | RESPIRATORY_TRACT | 11 refills | Status: AC | PRN

## 2024-05-14 NOTE — Patient Instructions (Addendum)
 For short of breath continue to Use nebulizer  (duoneb) up to 4 x daily if needed for short of breath  For cough / congestions  > mucinex dm 1200 mg every 12 hours as needed   Adjust 02 flow to keep your saturation above 90%    Doxycycline  100 mg twice daily x 10 days before eating with glass of wter  Prednisone  10 mg take  4 each am x 2 days,   2 each am x 2 days,  1 each am x 2 days and stop      Please remember to go to the lab department   for your tests - we will call you with the results when they are available.     Please schedule a follow up office visit in 4 weeks, call sooner if needed with all medications /inhalers/ solutions in hand so we can verify exactly what you are taking. This includes all medications from all doctors and over the counters

## 2024-05-14 NOTE — Progress Notes (Unsigned)
 [facility], male    DOB: Jan 07, 1936    MRN: [id number]   Brief patient profile:  28  yowm quit smoking in 1973  [facility] Pt self referred to pulmonary clinic in [place]  03/18/2024  for progressive doe  x one month over a baseline of doe x 100 ft @ [facility]'s last ov 07/18/21 and GOLD 3 COPD  criteria 08/31/21 and mod AS with Mild LAE  01/02/23    History of Present Illness  03/18/2024  Pulmonary/ 1st [facility] eval/ Blanchie Zeleznik / [facility]  Chief Complaint  Patient presents with   Acute Visit    Increased SOB over the past month. He has had some wheezing and increased cough- pred taper taken recently helped some. Cough is non prod.   Dyspnea:  with minimal activity /  some better p prednisone  Cough: better p abx and prednisone  still clearing throat. Sleep: flat bed several  pillow  SABA use: hfa not working / did not try neb  02: not using  Rec Pantoprazole  (protonix ) 40 mg   Take  30-60 min before first meal of the day and Pepcid  (famotidine )  20 mg after supper until return to [facility] -    GERD diet reviewed, bed blocks rec  Stop trelegy  For short of breath Use nebulizer up to 4 x daily  For cough / congestions  > mucinex dm 1200 mg every 12 hours as needed  Prednisone  10 mg : Take 4 for two days three for two days two for two days one for two days  Please remember to go to the  x-ray department  @  [facility] for your tests - we will call you with the results when they are available    Cxr 03/18/24 Chronic hyperinflation and central bronchial thickening. No acute findings.  Please schedule a follow up [facility] visit in 2 weeks, sooner if needed  with all medications /inhalers/ solutions in hand    04/02/2024  f/u ov/[facility] re: GOLD 3 COPD   maint on no resp rx  did not  bring all meds and has not used duoneb on day of ov - trelegy on hold due to cough which did improve off it.  Chief Complaint  Patient presents with   Follow-up  Dyspnea:  w/c most of the time  due to weakness / djd > sob  Cough: dry sounding now  Sleeping: flat bed bed one pillow s resp cc  SABA use: not using  02: uses prn   Rec Pantoprazole  (protonix ) 40 mg   Take  30-60 min before first meal of the day and Pepcid  (famotidine )  20 mg after supper until return to [facility] -   For short of breath continue to Use nebulizer up to 4 x daily if needed for short of breath For cough / congestions  > mucinex dm 1200 mg every 12 hours as needed  Continue 02 2lpm at bedtime and as needed with activity during the day to keep levels above 90% at all times  See your cardiologist  Please remember to go to the lab department   > not done      Please schedule a follow up [facility] visit in 6 weeks, call sooner if needed with all medications /inhalers/ solutions in hand   05/14/2024  f/u ov/[facility] re: COPD GOLD 3/ Mod AS/ 02 dep   maint on duoneb  did not  bring meds  - turns out just has alb and no  protable 02  Chief Complaint  Patient presents with   Shortness of Breath    DOE - cough w yellow/green mucus   Dyspnea:  very sedentary  Cough: green  since one week / resolved on pred/ doxy previously x months Sleeping: flat bed with 3 pillows    resp cc  SABA use: 3 x neb  and a lot 02: up to 5lpm - says has no portable system    No obvious day to day or daytime variability or assoc  mucus plugs or hemoptysis or cp or chest tightness, subjective wheeze or overt sinus or hb symptoms.    Also denies any obvious fluctuation of symptoms with weather or environmental changes or other aggravating or alleviating factors except as outlined above   No unusual exposure hx or h/o childhood pna/ asthma or knowledge of premature birth.  Current Allergies, Complete Past Medical History, Past Surgical History, Family History, and Social History were reviewed in [facility].  ROS  The following are not active complaints unless bolded Hoarseness, sore throat,  dysphagia, dental problems, itching, sneezing,  nasal congestion or discharge of excess mucus or purulent secretions, ear ache,   fever, chills, sweats, unintended wt loss or wt gain, classically pleuritic or exertional cp,  orthopnea pnd or arm/hand swelling  or leg swelling, presyncope, palpitations, abdominal pain, anorexia, nausea, vomiting, diarrhea  or change in bowel habits or change in bladder habits, change in stools or change in urine, dysuria, hematuria,  rash, arthralgias, visual complaints, headache, numbness, weakness or ataxia or problems with walking or coordination,  change in mood or  memory.        Current Meds  Medication Sig   albuterol  (VENTOLIN  HFA) 108 (90 Base) MCG/ACT inhaler USE 2 INHALATIONS BY MOUTH EVERY 6 HOURS AS NEEDED FOR WHEEZING  OR SHORTNESS OF BREATH   famotidine  (PEPCID ) 20 MG tablet One after supper   ipratropium-albuterol  (DUONEB) 0.5-2.5 (3) MG/3ML SOLN Take 3 mLs by nebulization every 6 (six) hours as needed.   Misc. Devices MISC Blood pressure cuff/device - 1. ICD10: I10   pantoprazole  (PROTONIX ) 40 MG tablet Take 1 tablet (40 mg total) by mouth daily. Take 30-60 min before first meal of the day              Past Medical History:  Diagnosis Date   Emphysema of lung (HCC)    Essential tremor    Hypertension       Objective:    Wts  05/14/2024        153  04/02/2024        160   03/18/24 160 lb (72.6 kg)  02/21/24 176 lb (79.8 kg)  01/14/23 176 lb (79.8 kg)      Vital signs reviewed  05/14/2024  - Note at rest 02 sats  85% on RA    General appearance:    w/c bound frail elderly wm nad   HEENT : Oropharynx  clear     NECK :  without  apparent JVD/ palpable Nodes/TM    LUNGS: no acc muscle use,  Mild barrel/kyphotic  contour chest wall with bilateral  Distant bs s audible wheeze and  without cough on insp or exp maneuvers  and mild  Hyperresonant  to  percussion bilaterally     CV:  RRR  no s3  2/6 SEM s  increase in P2, and trace  edeam both LEs   ABD:  soft and nontender with pos end  insp Hoover's  in the supine position.  No bruits or organomegaly appreciated   MS:  Nl gait/ ext warm without deformities Or obvious joint restrictions  calf tenderness, cyanosis or clubbing     SKIN: warm and dry without lesions    NEURO:  alert, approp, nl sensorium with  no motor or cerebellar deficits apparent.          Assessment   Assessment & Plan Stage 3 severe COPD by GOLD classification (HCC) Quit smoking 1973 s obvious sequelae - PFT's  08/31/21  FEV1 0.95 (38 % ) ratio 0.48  p 21 % improvement from saba p Trelegy prior to study with DLCO  15.5 (68%)   and FV curve classically concave - Echo 01/02/23  Mod AS with Mild LAE  nl ef    Having mild flare of CB prev respond to pred/ doxy > repeat and continue duoeb up to qid prn and consider adding budesonide 0.25 mg bid if develops more freq perceived need for prednisone    Chronic respiratory failure with hypoxia (HCC) On RA at rest 05/14/2024   sats 85% > provided dolly for portable tanks   >>> emphasized that due to even mod AS need to be sure sats > 90% at all times   Did not go to labs last ov and encouraged strongly to do so this time but not clear he did   Rec return in 4 weeks with all meds in hand using a trust but verify approach to confirm accurate Medication  Reconciliation The principal here is that until we are certain that the  patients are doing what we've asked, it makes no sense to ask them to do more.      Each maintenance medication was reviewed in detail including emphasizing most importantly the difference between maintenance and prns and under what circumstances the prns are to be triggered using an action plan format where appropriate.  Total time for H and P, chart review, counseling, reviewing neb/ 02/ pulse ox  device(s) and generating customized AVS unique to this [facility] visit / same day charting = 34 min  for multiple  refractory acute and  chronic respiratory  symptoms in complex pt with AS     AVS  Patient Instructions  For short of breath continue to Use nebulizer  (duoneb) up to 4 x daily if needed for short of breath  For cough / congestions  > mucinex dm 1200 mg every 12 hours as needed   Adjust 02 flow to keep your saturation above 90%    Doxycycline  100 mg twice daily x 10 days before eating with glass of wter  Prednisone  10 mg take  4 each am x 2 days,   2 each am x 2 days,  1 each am x 2 days and stop      Please remember to go to the lab department   for your tests - we will call you with the results when they are available.     Please schedule a follow up [facility] visit in 4 weeks, call sooner if needed with all medications /inhalers/ solutions in hand so we can verify exactly what you are taking. This includes all medications from all doctors and over the counters            Ozell America, MD 05/16/2024

## 2024-05-16 NOTE — Assessment & Plan Note (Addendum)
 Quit smoking 1973 s obvious sequelae - PFT's  08/31/21  FEV1 0.95 (38 % ) ratio 0.48  p 21 % improvement from saba p Trelegy prior to study with DLCO  15.5 (68%)   and FV curve classically concave - Echo 01/02/23  Mod AS with Mild LAE  nl ef    Having mild flare of CB prev respond to pred/ doxy > repeat and continue duoeb up to qid prn and consider adding budesonide 0.25 mg bid if develops more freq perceived need for prednisone 

## 2024-05-16 NOTE — Assessment & Plan Note (Addendum)
 On RA at rest 05/14/2024   sats 85% > provided dolly for portable tanks   >>> emphasized that due to even mod AS need to be sure sats > 90% at all times   Did not go to labs last ov and encouraged strongly to do so this time but not clear he did   Rec return in 4 weeks with all meds in hand using a trust but verify approach to confirm accurate Medication  Reconciliation The principal here is that until we are certain that the  patients are doing what we've asked, it makes no sense to ask them to do more.      Each maintenance medication was reviewed in detail including emphasizing most importantly the difference between maintenance and prns and under what circumstances the prns are to be triggered using an action plan format where appropriate.  Total time for H and P, chart review, counseling, reviewing neb/ 02/ pulse ox  device(s) and generating customized AVS unique to this office visit / same day charting = 34 min  for multiple  refractory acute and chronic respiratory  symptoms in complex pt with AS

## 2024-05-18 LAB — CBC WITH DIFFERENTIAL/PLATELET
Basophils Absolute: 0.1 x10E3/uL (ref 0.0–0.2)
Basos: 1 %
EOS (ABSOLUTE): 0.3 x10E3/uL (ref 0.0–0.4)
Eos: 4 %
Hematocrit: 41.2 % (ref 37.5–51.0)
Hemoglobin: 13.5 g/dL (ref 13.0–17.7)
Immature Grans (Abs): 0 x10E3/uL (ref 0.0–0.1)
Immature Granulocytes: 0 %
Lymphocytes Absolute: 0.8 x10E3/uL (ref 0.7–3.1)
Lymphs: 9 %
MCH: 31.5 pg (ref 26.6–33.0)
MCHC: 32.8 g/dL (ref 31.5–35.7)
MCV: 96 fL (ref 79–97)
Monocytes Absolute: 0.7 x10E3/uL (ref 0.1–0.9)
Monocytes: 9 %
Neutrophils Absolute: 6.2 x10E3/uL (ref 1.4–7.0)
Neutrophils: 77 %
Platelets: 330 x10E3/uL (ref 150–450)
RBC: 4.28 x10E6/uL (ref 4.14–5.80)
RDW: 12.5 % (ref 11.6–15.4)
WBC: 8.1 x10E3/uL (ref 3.4–10.8)

## 2024-05-18 LAB — SEDIMENTATION RATE: Sed Rate: 5 mm/h (ref 0–30)

## 2024-05-18 LAB — BASIC METABOLIC PANEL WITH GFR
BUN/Creatinine Ratio: 12 (ref 10–24)
BUN: 11 mg/dL (ref 8–27)
CO2: 25 mmol/L (ref 20–29)
Calcium: 9.6 mg/dL (ref 8.6–10.2)
Chloride: 96 mmol/L (ref 96–106)
Creatinine, Ser: 0.89 mg/dL (ref 0.76–1.27)
Glucose: 117 mg/dL — ABNORMAL HIGH (ref 70–99)
Potassium: 4.2 mmol/L (ref 3.5–5.2)
Sodium: 138 mmol/L (ref 134–144)
eGFR: 83 mL/min/1.73 (ref >59)

## 2024-05-18 LAB — ALPHA-1-ANTITRYPSIN PHENOTYP: A-1 Antitrypsin: 169 mg/dL (ref 101–187)

## 2024-05-18 LAB — TSH: TSH: 4.19 u[IU]/mL (ref 0.450–4.500)

## 2024-05-18 LAB — BRAIN NATRIURETIC PEPTIDE: BNP: 201 pg/mL — ABNORMAL HIGH (ref 0.0–100.0)

## 2024-06-01 ENCOUNTER — Other Ambulatory Visit: Payer: Self-pay

## 2024-06-01 ENCOUNTER — Emergency Department (HOSPITAL_COMMUNITY)

## 2024-06-01 ENCOUNTER — Inpatient Hospital Stay (HOSPITAL_COMMUNITY)
Admission: EM | Admit: 2024-06-01 | Discharge: 2024-06-21 | DRG: 208 | Disposition: A | Discharge status: Skilled Nursing Facility | Attending: Internal Medicine | Admitting: Internal Medicine

## 2024-06-01 ENCOUNTER — Inpatient Hospital Stay (HOSPITAL_COMMUNITY)

## 2024-06-01 ENCOUNTER — Encounter (HOSPITAL_COMMUNITY): Payer: Self-pay

## 2024-06-01 DIAGNOSIS — I499 Cardiac arrhythmia, unspecified: Secondary | ICD-10-CM | POA: Diagnosis not present

## 2024-06-01 DIAGNOSIS — Z1152 Encounter for screening for COVID-19: Secondary | ICD-10-CM

## 2024-06-01 DIAGNOSIS — K219 Gastro-esophageal reflux disease without esophagitis: Secondary | ICD-10-CM | POA: Diagnosis not present

## 2024-06-01 DIAGNOSIS — R0603 Acute respiratory distress: Secondary | ICD-10-CM | POA: Diagnosis not present

## 2024-06-01 DIAGNOSIS — I82431 Acute embolism and thrombosis of right popliteal vein: Secondary | ICD-10-CM | POA: Diagnosis not present

## 2024-06-01 DIAGNOSIS — Z66 Do not resuscitate: Secondary | ICD-10-CM | POA: Diagnosis not present

## 2024-06-01 DIAGNOSIS — N179 Acute kidney failure, unspecified: Secondary | ICD-10-CM | POA: Diagnosis not present

## 2024-06-01 DIAGNOSIS — J9622 Acute and chronic respiratory failure with hypercapnia: Secondary | ICD-10-CM | POA: Diagnosis present

## 2024-06-01 DIAGNOSIS — I959 Hypotension, unspecified: Secondary | ICD-10-CM | POA: Diagnosis not present

## 2024-06-01 DIAGNOSIS — M7989 Other specified soft tissue disorders: Secondary | ICD-10-CM | POA: Diagnosis not present

## 2024-06-01 DIAGNOSIS — I080 Rheumatic disorders of both mitral and aortic valves: Secondary | ICD-10-CM | POA: Diagnosis present

## 2024-06-01 DIAGNOSIS — R2242 Localized swelling, mass and lump, left lower limb: Secondary | ICD-10-CM | POA: Diagnosis not present

## 2024-06-01 DIAGNOSIS — Z515 Encounter for palliative care: Secondary | ICD-10-CM | POA: Diagnosis not present

## 2024-06-01 DIAGNOSIS — R54 Age-related physical debility: Secondary | ICD-10-CM | POA: Diagnosis present

## 2024-06-01 DIAGNOSIS — J9601 Acute respiratory failure with hypoxia: Secondary | ICD-10-CM | POA: Diagnosis not present

## 2024-06-01 DIAGNOSIS — J441 Chronic obstructive pulmonary disease with (acute) exacerbation: Secondary | ICD-10-CM | POA: Diagnosis present

## 2024-06-01 DIAGNOSIS — Z4682 Encounter for fitting and adjustment of non-vascular catheter: Secondary | ICD-10-CM | POA: Diagnosis not present

## 2024-06-01 DIAGNOSIS — J9602 Acute respiratory failure with hypercapnia: Secondary | ICD-10-CM

## 2024-06-01 DIAGNOSIS — J449 Chronic obstructive pulmonary disease, unspecified: Secondary | ICD-10-CM | POA: Diagnosis present

## 2024-06-01 DIAGNOSIS — I4891 Unspecified atrial fibrillation: Secondary | ICD-10-CM | POA: Diagnosis not present

## 2024-06-01 DIAGNOSIS — J439 Emphysema, unspecified: Secondary | ICD-10-CM | POA: Diagnosis not present

## 2024-06-01 DIAGNOSIS — J984 Other disorders of lung: Secondary | ICD-10-CM | POA: Diagnosis not present

## 2024-06-01 DIAGNOSIS — R410 Disorientation, unspecified: Secondary | ICD-10-CM | POA: Diagnosis not present

## 2024-06-01 DIAGNOSIS — Z9981 Dependence on supplemental oxygen: Secondary | ICD-10-CM | POA: Diagnosis not present

## 2024-06-01 DIAGNOSIS — R918 Other nonspecific abnormal finding of lung field: Secondary | ICD-10-CM | POA: Diagnosis not present

## 2024-06-01 DIAGNOSIS — R1310 Dysphagia, unspecified: Secondary | ICD-10-CM | POA: Diagnosis not present

## 2024-06-01 DIAGNOSIS — R402431 Glasgow coma scale score 3-8, in the field [EMT or ambulance]: Secondary | ICD-10-CM | POA: Diagnosis not present

## 2024-06-01 DIAGNOSIS — R0602 Shortness of breath: Secondary | ICD-10-CM | POA: Diagnosis not present

## 2024-06-01 DIAGNOSIS — J9621 Acute and chronic respiratory failure with hypoxia: Principal | ICD-10-CM | POA: Diagnosis present

## 2024-06-01 DIAGNOSIS — E870 Hyperosmolality and hypernatremia: Secondary | ICD-10-CM | POA: Diagnosis not present

## 2024-06-01 DIAGNOSIS — J690 Pneumonitis due to inhalation of food and vomit: Secondary | ICD-10-CM | POA: Diagnosis not present

## 2024-06-01 DIAGNOSIS — I952 Hypotension due to drugs: Secondary | ICD-10-CM

## 2024-06-01 DIAGNOSIS — R739 Hyperglycemia, unspecified: Secondary | ICD-10-CM

## 2024-06-01 DIAGNOSIS — Z743 Need for continuous supervision: Secondary | ICD-10-CM | POA: Diagnosis not present

## 2024-06-01 DIAGNOSIS — J9620 Acute and chronic respiratory failure, unspecified whether with hypoxia or hypercapnia: Secondary | ICD-10-CM | POA: Diagnosis present

## 2024-06-01 DIAGNOSIS — I160 Hypertensive urgency: Secondary | ICD-10-CM | POA: Diagnosis not present

## 2024-06-01 DIAGNOSIS — E782 Mixed hyperlipidemia: Secondary | ICD-10-CM | POA: Diagnosis not present

## 2024-06-01 DIAGNOSIS — I10 Essential (primary) hypertension: Secondary | ICD-10-CM | POA: Diagnosis present

## 2024-06-01 DIAGNOSIS — E871 Hypo-osmolality and hyponatremia: Secondary | ICD-10-CM | POA: Diagnosis not present

## 2024-06-01 DIAGNOSIS — I739 Peripheral vascular disease, unspecified: Secondary | ICD-10-CM | POA: Diagnosis not present

## 2024-06-01 DIAGNOSIS — J432 Centrilobular emphysema: Secondary | ICD-10-CM | POA: Diagnosis present

## 2024-06-01 DIAGNOSIS — Z681 Body mass index (BMI) 19 or less, adult: Secondary | ICD-10-CM | POA: Diagnosis not present

## 2024-06-01 DIAGNOSIS — I4510 Unspecified right bundle-branch block: Secondary | ICD-10-CM | POA: Diagnosis not present

## 2024-06-01 DIAGNOSIS — R404 Transient alteration of awareness: Secondary | ICD-10-CM | POA: Diagnosis not present

## 2024-06-01 DIAGNOSIS — J479 Bronchiectasis, uncomplicated: Secondary | ICD-10-CM | POA: Diagnosis present

## 2024-06-01 DIAGNOSIS — Z87891 Personal history of nicotine dependence: Secondary | ICD-10-CM

## 2024-06-01 DIAGNOSIS — Z23 Encounter for immunization: Secondary | ICD-10-CM

## 2024-06-01 DIAGNOSIS — E039 Hypothyroidism, unspecified: Secondary | ICD-10-CM | POA: Diagnosis not present

## 2024-06-01 DIAGNOSIS — R069 Unspecified abnormalities of breathing: Secondary | ICD-10-CM | POA: Diagnosis not present

## 2024-06-01 DIAGNOSIS — K224 Dyskinesia of esophagus: Secondary | ICD-10-CM | POA: Diagnosis not present

## 2024-06-01 DIAGNOSIS — Z7189 Other specified counseling: Secondary | ICD-10-CM | POA: Diagnosis not present

## 2024-06-01 DIAGNOSIS — E440 Moderate protein-calorie malnutrition: Secondary | ICD-10-CM | POA: Diagnosis present

## 2024-06-01 DIAGNOSIS — I700 Atherosclerosis of aorta: Secondary | ICD-10-CM | POA: Diagnosis not present

## 2024-06-01 DIAGNOSIS — I341 Nonrheumatic mitral (valve) prolapse: Secondary | ICD-10-CM | POA: Diagnosis not present

## 2024-06-01 DIAGNOSIS — J42 Unspecified chronic bronchitis: Secondary | ICD-10-CM | POA: Diagnosis not present

## 2024-06-01 DIAGNOSIS — Z7901 Long term (current) use of anticoagulants: Secondary | ICD-10-CM

## 2024-06-01 DIAGNOSIS — T380X5A Adverse effect of glucocorticoids and synthetic analogues, initial encounter: Secondary | ICD-10-CM | POA: Diagnosis present

## 2024-06-01 LAB — CBC WITH DIFFERENTIAL/PLATELET
Abs Immature Granulocytes: 0.05 K/uL (ref 0.00–0.07)
Basophils Absolute: 0.1 K/uL (ref 0.0–0.1)
Basophils Relative: 1 %
Eosinophils Absolute: 0.9 K/uL — ABNORMAL HIGH (ref 0.0–0.5)
Eosinophils Relative: 7 %
HCT: 43.2 % (ref 39.0–52.0)
Hemoglobin: 13.6 g/dL (ref 13.0–17.0)
Immature Granulocytes: 0 %
Lymphocytes Relative: 22 %
Lymphs Abs: 2.8 K/uL (ref 0.7–4.0)
MCH: 31.6 pg (ref 26.0–34.0)
MCHC: 31.5 g/dL (ref 30.0–36.0)
MCV: 100.2 fL — ABNORMAL HIGH (ref 80.0–100.0)
Monocytes Absolute: 1.1 K/uL — ABNORMAL HIGH (ref 0.1–1.0)
Monocytes Relative: 9 %
Neutro Abs: 7.6 K/uL (ref 1.7–7.7)
Neutrophils Relative %: 61 %
Platelets: 374 K/uL (ref 150–400)
RBC: 4.31 MIL/uL (ref 4.22–5.81)
RDW: 13.2 % (ref 11.5–15.5)
WBC: 12.5 K/uL — ABNORMAL HIGH (ref 4.0–10.5)
nRBC: 0 % (ref 0.0–0.2)

## 2024-06-01 LAB — I-STAT CHEM 8, ED
BUN: 16 mg/dL (ref 8–23)
Calcium, Ion: 1.25 mmol/L (ref 1.15–1.40)
Chloride: 100 mmol/L (ref 98–111)
Creatinine, Ser: 1.2 mg/dL (ref 0.61–1.24)
Glucose, Bld: 246 mg/dL — ABNORMAL HIGH (ref 70–99)
HCT: 42 % (ref 39.0–52.0)
Hemoglobin: 14.3 g/dL (ref 13.0–17.0)
Potassium: 5 mmol/L (ref 3.5–5.1)
Sodium: 138 mmol/L (ref 135–145)
TCO2: 31 mmol/L (ref 22–32)

## 2024-06-01 LAB — CBG MONITORING, ED: Glucose-Capillary: 240 mg/dL — ABNORMAL HIGH (ref 70–99)

## 2024-06-01 LAB — BASIC METABOLIC PANEL WITH GFR
Anion gap: 10 (ref 5–15)
BUN: 16 mg/dL (ref 8–23)
CO2: 28 mmol/L (ref 22–32)
Calcium: 8.8 mg/dL — ABNORMAL LOW (ref 8.9–10.3)
Chloride: 98 mmol/L (ref 98–111)
Creatinine, Ser: 1.07 mg/dL (ref 0.61–1.24)
GFR, Estimated: >60 mL/min (ref >60)
Glucose, Bld: 244 mg/dL — ABNORMAL HIGH (ref 70–99)
Potassium: 4.9 mmol/L (ref 3.5–5.1)
Sodium: 136 mmol/L (ref 135–145)

## 2024-06-01 LAB — BRAIN NATRIURETIC PEPTIDE: B Natriuretic Peptide: 564 pg/mL — ABNORMAL HIGH (ref 0.0–100.0)

## 2024-06-01 LAB — BLOOD GAS, ARTERIAL
Acid-base deficit: 2.8 mmol/L — ABNORMAL HIGH (ref 0.0–2.0)
Bicarbonate: 25.4 mmol/L (ref 20.0–28.0)
Drawn by: 28340
O2 Saturation: 100 %
Patient temperature: 37
pCO2 arterial: 58 mmHg — ABNORMAL HIGH (ref 32–48)
pH, Arterial: 7.25 — ABNORMAL LOW (ref 7.35–7.45)
pO2, Arterial: 344 mmHg — ABNORMAL HIGH (ref 83–108)

## 2024-06-01 LAB — RESP PANEL BY RT-PCR (RSV, FLU A&B, COVID)  RVPGX2
Influenza A by PCR: NEGATIVE
Influenza B by PCR: NEGATIVE
Resp Syncytial Virus by PCR: NEGATIVE
SARS Coronavirus 2 by RT PCR: NEGATIVE

## 2024-06-01 LAB — TROPONIN I (HIGH SENSITIVITY): Troponin I (High Sensitivity): 79 ng/L — ABNORMAL HIGH (ref <18)

## 2024-06-01 LAB — LACTIC ACID, PLASMA: Lactic Acid, Venous: 1.2 mmol/L (ref 0.5–1.9)

## 2024-06-01 MED ORDER — PROPOFOL 1000 MG/100ML IV EMUL
INTRAVENOUS | Status: AC
  Administered 2024-06-01: 5 ug/kg/min via INTRAVENOUS
  Filled 2024-06-01: qty 100

## 2024-06-01 MED ORDER — PROPOFOL 1000 MG/100ML IV EMUL
0.0000 ug/kg/min | INTRAVENOUS | Status: DC
  Administered 2024-06-02: 30 ug/kg/min via INTRAVENOUS
  Administered 2024-06-02: 50 ug/kg/min via INTRAVENOUS
  Administered 2024-06-02: 40 ug/kg/min via INTRAVENOUS
  Administered 2024-06-02: 45 ug/kg/min via INTRAVENOUS
  Administered 2024-06-03: 30 ug/kg/min via INTRAVENOUS
  Filled 2024-06-01 (×5): qty 100

## 2024-06-01 MED ORDER — NOREPINEPHRINE 4 MG/250ML-% IV SOLN
0.0000 ug/min | INTRAVENOUS | Status: DC
  Administered 2024-06-02 – 2024-06-03 (×2): 2 ug/min via INTRAVENOUS
  Filled 2024-06-01: qty 250

## 2024-06-01 MED ORDER — SODIUM CHLORIDE 0.9 % IV BOLUS
1000.0000 mL | Freq: Once | INTRAVENOUS | Status: AC
  Administered 2024-06-01: 1000 mL via INTRAVENOUS

## 2024-06-01 MED ORDER — VANCOMYCIN HCL IN DEXTROSE 1-5 GM/200ML-% IV SOLN
1000.0000 mg | Freq: Once | INTRAVENOUS | Status: AC
  Administered 2024-06-01: 1000 mg via INTRAVENOUS
  Filled 2024-06-01: qty 200

## 2024-06-01 MED ORDER — ROCURONIUM BROMIDE 10 MG/ML (PF) SYRINGE: 100.0000 mg | PREFILLED_SYRINGE | Freq: Once | INTRAVENOUS | Status: AC

## 2024-06-01 MED ORDER — ROCURONIUM BROMIDE 10 MG/ML (PF) SYRINGE
PREFILLED_SYRINGE | INTRAVENOUS | Status: AC
  Administered 2024-06-01: 100 mg via INTRAVENOUS
  Filled 2024-06-01: qty 10

## 2024-06-01 MED ORDER — SODIUM CHLORIDE 0.9 % IV BOLUS: 500.0000 mL | Freq: Once | INTRAVENOUS | Status: DC

## 2024-06-01 MED ORDER — IPRATROPIUM BROMIDE 0.02 % IN SOLN
0.5000 mg | Freq: Once | RESPIRATORY_TRACT | Status: AC
  Administered 2024-06-01: 0.5 mg via RESPIRATORY_TRACT
  Filled 2024-06-01: qty 2.5

## 2024-06-01 MED ORDER — SODIUM CHLORIDE 0.9 % IV SOLN
2.0000 g | Freq: Once | INTRAVENOUS | Status: AC
  Administered 2024-06-01: 2 g via INTRAVENOUS
  Filled 2024-06-01: qty 12.5

## 2024-06-01 MED ORDER — ALBUTEROL SULFATE (2.5 MG/3ML) 0.083% IN NEBU
INHALATION_SOLUTION | RESPIRATORY_TRACT | Status: AC
  Administered 2024-06-01: 10 mg/h via RESPIRATORY_TRACT
  Filled 2024-06-01: qty 12

## 2024-06-01 MED ORDER — KETAMINE HCL 50 MG/5ML IJ SOSY
100.0000 mg | PREFILLED_SYRINGE | Freq: Once | INTRAMUSCULAR | Status: DC
Start: 2024-06-01 — End: 2024-06-02

## 2024-06-01 MED ORDER — KETAMINE HCL 10 MG/ML IJ SOLN
INTRAMUSCULAR | Status: AC
  Administered 2024-06-01: 100 mg
  Filled 2024-06-01: qty 1

## 2024-06-01 MED ORDER — SODIUM CHLORIDE 0.9 % IV SOLN
250.0000 mL | INTRAVENOUS | Status: AC
  Administered 2024-06-01: 250 mL via INTRAVENOUS

## 2024-06-01 MED ORDER — NOREPINEPHRINE 4 MG/250ML-% IV SOLN
INTRAVENOUS | Status: AC
  Administered 2024-06-01: 5 ug/min via INTRAVENOUS
  Filled 2024-06-01: qty 250

## 2024-06-01 MED ORDER — ALBUTEROL SULFATE (2.5 MG/3ML) 0.083% IN NEBU: 10.0000 mg/h | INHALATION_SOLUTION | Freq: Once | RESPIRATORY_TRACT | Status: AC

## 2024-06-01 MED ORDER — IOHEXOL 350 MG/ML SOLN
75.0000 mL | Freq: Once | INTRAVENOUS | Status: AC | PRN
Start: 2024-06-01 — End: 2024-06-01
  Administered 2024-06-01: 75 mL via INTRAVENOUS

## 2024-06-01 MED ORDER — FENTANYL 2500MCG IN NS 250ML (10MCG/ML) PREMIX INFUSION
INTRAVENOUS | Status: AC
  Filled 2024-06-01: qty 250

## 2024-06-01 MED ORDER — FENTANYL CITRATE (PF) 100 MCG/2ML IJ SOLN
50.0000 ug | Freq: Once | INTRAMUSCULAR | Status: AC
  Administered 2024-06-01: 50 ug via INTRAVENOUS
  Filled 2024-06-01: qty 2

## 2024-06-01 NOTE — ED Triage Notes (Signed)
 Pt to er room number 14, per ems they were called for shortness of breath, pt's eyes are open, pt not answering questions, pt presents on bipap, md at bedside, rt at bedside, multiple nurses at bedside.

## 2024-06-01 NOTE — ED Notes (Signed)
Called [facility] for transport 

## 2024-06-01 NOTE — Progress Notes (Signed)
 Pt transported to CT and back without issue. ETT advanced to 24 following CXR.

## 2024-06-01 NOTE — ED Notes (Signed)
 [facility] at bedside loading pt to transport

## 2024-06-01 NOTE — ED Notes (Signed)
 Per md, levo is good with a map above 60 and a systolic above 90

## 2024-06-01 NOTE — ED Notes (Signed)
 Critical care paged @ 2105 VO EDP

## 2024-06-01 NOTE — ED Notes (Signed)
 Bear hugger placed secondary to temp of 22f

## 2024-06-01 NOTE — Consult Note (Incomplete)
 NAME:  Martin Ford, MRN:  [id number], DOB:  03/04/1936, LOS: 0 ADMISSION DATE:  06/01/2024, CONSULTATION DATE:  06/01/24 REFERRING MD:  [facility]  CHIEF COMPLAINT:  Dyspnea   History of Present Illness:  Pt is encephelopathic; therefore, this HPI is obtained from chart review. Martin Ford is a 88 y.o. male who has a PMH as below including but not limited to COPD Stage 3 by GOLD (PFT's Dec 2022 with FEV1 0.95 (38%), ratio 0.48) and chronically on 2L O2. He sees Dr. Darlean and just saw him as an outpatient on 05/14/24 and was prescribed a prednisone  taper and 10 day course of Doxycycline .  On 9/29, he had worsening dyspnea despite increase in O2 from baseline 2L to 4L. He was taken to [facility] where he required intubation. He was given 1 dose of Vancomycin and Cefepime empirically.  CTA chest demonstrated Bronchiectasis, no PE. CT head demonstrated no acute issues, generalized atrophy and sinus thickening.  He was later transferred to [facility] for further management.  Pertinent  Medical History:  has Centrilobular emphysema (HCC); Essential tremor; Essential hypertension; Vitamin D deficiency; Mixed hyperlipidemia; Hypothyroidism; Prediabetes; PVD (peripheral vascular disease); Stage 3 severe COPD by GOLD classification (HCC); DOE (dyspnea on exertion); Chronic respiratory failure with hypoxia (HCC); Ascending aortic aneurysm; Refused influenza vaccine; Pulmonary nodule; COPD with acute exacerbation (HCC); and COPD (chronic obstructive pulmonary disease) (HCC) on their problem list.  Significant Hospital Events: Including procedures, antibiotic start and stop dates in addition to other pertinent events   9/29 admit.  Interim History / Subjective:  N/a  Objective:  Blood pressure 93/64, pulse (!) 112, temperature (!) 97.1 F (36.2 C), resp. rate 18, height 5' 11 (1.803 m), weight 68 kg, SpO2 98%.    Vent Mode: PRVC FiO2 (%):  [70 %-100 %] 70 % Set Rate:  [16 bmp-18 bmp] 18 bmp Vt Set:   [600 mL] 600 mL PEEP:  [5 cmH20-6 cmH20] 5 cmH20 Plateau Pressure:  [21 cmH20] 21 cmH20  No intake or output data in the 24 hours ending 06/01/24 2150 Filed Weights   06/01/24 2056  Weight: 68 kg     Physical Exam: General: on vent, resting in bed, in NAD. Neuro: awake despite sedation on board, follows simple commands HEENT: [place]/AT. Sclerae anicteric. AC/NT. Cardiovascular: reg s1s2 , no M/R/G.  Lungs: Respirations even and unlabored.  CTA bilaterally, No W/R/R.  Abdomen: BS x 4, soft, NT/ND.  Musculoskeletal: No gross deformities, no edema.     Labs/imaging personally reviewed:  CTA chest 9/29 >   CT Angio Chest PE W and/or Wo Contrast (Accession [id number]) (Order [id number]) Imaging Date: 06/01/2024 Department: [facility] 46M MEDICAL ICU Released By/Authorizing: Yolande Lamar BROCKS, MD (auto-released)   Exam Status  Status  Final [99]   PACS Intelerad Image Link   Show images for CT Angio Chest PE W and/or Wo Contrast Related Results          CT Head Wo Contrast Final result 06/01/2024 10:35 PM      CLINICAL DATA:  Shortness of breath.  EXAM: CT HEAD WITHOUT CONTRAST  TECHNIQUE: Contiguous axial images were obtained from the base of the skull through the vertex without intravenous contrast.  RADIATION DOSE REDUCTION: This exam was performed according to the ...   Study Result  Narrative & Impression  CLINICAL DATA:  Shortness of breath.   EXAM: CT ANGIOGRAPHY CHEST WITH CONTRAST   TECHNIQUE: Multidetector CT imaging of the chest was performed using  the standard protocol during bolus administration of intravenous contrast. Multiplanar CT image reconstructions and MIPs were obtained to evaluate the vascular anatomy.   RADIATION DOSE REDUCTION: This exam was performed according to the departmental dose-optimization program which includes automated exposure control, adjustment of the mA and/or kV according to patient size and/or use of  iterative reconstruction technique.   CONTRAST:  75mL OMNIPAQUE IOHEXOL 350 MG/ML SOLN   COMPARISON:  July 25, 2022   FINDINGS: Cardiovascular: There is marked severity calcification of the thoracic aorta without evidence of aortic aneurysm or dissection. Satisfactory opacification of the pulmonary arteries to the segmental level. No evidence of pulmonary embolism. Normal heart size. There is marked severity coronary artery calcification. No pericardial effusion.   Mediastinum/Nodes: Endotracheal and orogastric tubes are in place. No enlarged mediastinal, hilar, or axillary lymph nodes. A mild amount of heterogeneous low-attenuation is seen within the posterior aspect of the trachea at the level of the carina and proximal left mainstem bronchus. The thyroid  gland and esophagus demonstrate no significant findings.   Lungs/Pleura: Mild lingular and mild bibasilar linear scarring and/or atelectasis is seen. Mild to moderate severity bronchiectasis is also noted within the bilateral lower lobes. No pleural effusion or pneumothorax is identified.   Upper Abdomen: No acute abnormality.   Musculoskeletal: Multilevel degenerative changes are seen throughout the thoracic spine.   Review of the MIP images confirms the above findings.   IMPRESSION: 1. No evidence of pulmonary embolism. 2. Mild lingular and mild bibasilar linear scarring and/or atelectasis. 3. Mild to moderate severity bilateral lower lobe bronchiectasis. 4. Marked severity coronary artery calcification. 5. Low attenuation within the trachea which may represent a mild amount of mucus. 6. Aortic atherosclerosis.     Electronically Signed   By: Suzen Dials M.D.   On: 06/01/2024 22:57            CT head 9/29 >   CT Head Wo Contrast (Accession [id number]) (Order [id number]) Imaging Date: 06/01/2024 Department: [facility] 110M MEDICAL ICU Released By/Authorizing: Yolande Lamar BROCKS, MD  (auto-released)   Exam Status  Status  Final [99]   Narrative & Impression  CLINICAL DATA:  Shortness of breath.   EXAM: CT HEAD WITHOUT CONTRAST   TECHNIQUE: Contiguous axial images were obtained from the base of the skull through the vertex without intravenous contrast.   RADIATION DOSE REDUCTION: This exam was performed according to the departmental dose-optimization program which includes automated exposure control, adjustment of the mA and/or kV according to patient size and/or use of iterative reconstruction technique.   COMPARISON:  None Available.   FINDINGS: Brain: There is generalized cerebral atrophy with widening of the extra-axial spaces and ventricular dilatation. There are areas of decreased attenuation within the white matter tracts of the supratentorial brain, consistent with microvascular disease changes.   Vascular: Marked severity bilateral cavernous carotid calcification is noted.   Skull: Normal. Negative for fracture or focal lesion.   Sinuses/Orbits: Mild bilateral ethmoid sinus and marked severity bilateral nasal mucosal thickening is seen.   Other: None.   IMPRESSION: 1. Generalized cerebral atrophy with widening of the extra-axial spaces and ventricular dilatation. 2. Bilateral ethmoid sinus and marked severity bilateral nasal mucosal thickening.     Electronically Signed   By: Suzen Dials M.D.   On: 06/01/2024 22:59         Assessment & Plan:   Acute on chronic hypoxic/hypercapnic respiratory failure 2/2 AECOPD - COPD Stage 3 by GOLD (PFT's Dec 2022 with FEV1  0.95 (38%), ratio 0.48) and chronically on 2L O2. S/p intubation in ED 9/29 and given 1 dose of Vanc/Cefepime empirically. - Continue full vent support. - Bronchial hygiene. - Check RVP, PCT. - Solumedrol 60mg  BID for now. - Empiric CTX (just completed 10 days Doxycycline  around 05/23/24. - Duonebs scheduled, add Yuplelri. - Albuterol  PRN. - CXR  intermittently.  Hypotension - 2/2 sedation. - Low dose NE as needed for goal MAP . 65. - Wean NE as able. - Limit sedation as able.  Hyperglycemia - presumed 2/2 steroids. - SSI.   Labs   CBC: Recent Labs  Lab 06/01/24 2105 06/01/24 2118  WBC 12.5*  --   NEUTROABS 7.6  --   HGB 13.6 14.3  HCT 43.2 42.0  MCV 100.2*  --   PLT 374  --     Basic Metabolic Panel: Recent Labs  Lab 06/01/24 2105 06/01/24 2118  NA 136 138  K 4.9 5.0  CL 98 100  CO2 28  --   GLUCOSE 244* 246*  BUN 16 16  CREATININE 1.07 1.20  CALCIUM  8.8*  --    GFR: Estimated Creatinine Clearance: 42.5 mL/min (by C-G formula based on SCr of 1.2 mg/dL). Recent Labs  Lab 06/01/24 2105  WBC 12.5*    Liver Function Tests: No results for input(s): AST, ALT, ALKPHOS, BILITOT, PROT, ALBUMIN in the last 168 hours. No results for input(s): LIPASE, AMYLASE in the last 168 hours. No results for input(s): AMMONIA in the last 168 hours.  ABG    Component Value Date/Time   PHART 7.25 (L) 06/01/2024 2130   PCO2ART 58 (H) 06/01/2024 2130   PO2ART 344 (H) 06/01/2024 2130   HCO3 25.4 06/01/2024 2130   TCO2 31 06/01/2024 2118   ACIDBASEDEF 2.8 (H) 06/01/2024 2130   O2SAT 100 06/01/2024 2130     Coagulation Profile: No results for input(s): INR, PROTIME in the last 168 hours.  Cardiac Enzymes: No results for input(s): CKTOTAL, CKMB, CKMBINDEX, TROPONINI in the last 168 hours.  HbA1C: Hgb A1c MFr Bld  Date/Time Value Ref Range Status  12/14/2021 08:32 AM 5.3 4.8 - 5.6 % Final    Comment:             Prediabetes: 5.7 - 6.4          Diabetes: >6.4          Glycemic control for adults with diabetes: <7.0     CBG: Recent Labs  Lab 06/01/24 2053  GLUCAP 240*    Review of Systems:   Intubated unable to perform  Past Medical History:  He,  has a past medical history of Emphysema of lung (HCC), Essential tremor, and Hypertension.   Surgical History:  History  reviewed. No pertinent surgical history.   Social History:   reports that he quit smoking about 52 years ago. His smoking use included cigarettes. He has never used smokeless tobacco. He reports current alcohol use of about 2.0 standard drinks of alcohol per week. He reports that he does not use drugs.   Family History:  His family history includes Lung disease in his paternal grandfather.   Allergies No Known Allergies   Home Medications  Prior to Admission medications   Medication Sig Start Date End Date Taking? Authorizing Provider  albuterol  (VENTOLIN  HFA) 108 (90 Base) MCG/ACT inhaler USE 2 INHALATIONS BY MOUTH EVERY 6 HOURS AS NEEDED FOR WHEEZING  OR SHORTNESS OF BREATH 10/08/23   Tobie Suzzane POUR, MD  doxycycline  (VIBRA -TABS) 100  MG tablet Take 1 tablet (100 mg total) by mouth 2 (two) times daily. 05/14/24   Darlean Ozell NOVAK, MD  famotidine  (PEPCID ) 20 MG tablet One after supper 03/18/24   Wert, Michael B, MD  ipratropium-albuterol  (DUONEB) 0.5-2.5 (3) MG/3ML SOLN Take 3 mLs by nebulization every 6 (six) hours as needed. 05/14/24   Darlean Ozell NOVAK, MD  Misc. Devices MISC Blood pressure cuff/device - 1. ICD10: I10 06/14/22   Tobie Suzzane POUR, MD  pantoprazole  (PROTONIX ) 40 MG tablet Take 1 tablet (40 mg total) by mouth daily. Take 30-60 min before first meal of the day 03/18/24   Darlean Ozell NOVAK, MD  predniSONE  (DELTASONE ) 10 MG tablet Take  4 each am x 2 days,   2 each am x 2 days,  1 each am x 2 days and stop 05/14/24   Darlean Ozell NOVAK, MD     Critical care time: 34   The patient is critically ill with multiple organ system failure and requires high complexity decision making for assessment and support, frequent evaluation and titration of therapies, advanced monitoring, review of radiographic studies and interpretation of complex data.   Critical Care Time devoted to patient care services, exclusive of separately billable procedures, described in this note is 32 minutes.   Orlin Fairly,  MD [facility] See Amion for pager  If no response to pager , please call [phone] until 7pm After 7:00 pm call Elink  [phone] 06/02/2024, 1:32 AM

## 2024-06-01 NOTE — ED Notes (Addendum)
 Respiratory called @ 2041 prior to EMS arrival

## 2024-06-01 NOTE — ED Notes (Signed)
 EDP called prior to EMS arrival @ 2041

## 2024-06-01 NOTE — ED Provider Notes (Signed)
 [place] EMERGENCY DEPARTMENT AT [facility] Provider Note   CSN: [id number] Arrival date & time: 06/01/24  2046     Patient presents with: Shortness of Breath   Martin Ford is a 88 y.o. male.   Hx obtained via EMS and patient's wife  9 yo M with hx of COPD who presented in respiratory distress. This morning was very short of breath. Started becoming more anxious. Typically is on 2L oxygen  and today was on 4L. Started taking it off and was more short of breath so they called 911. Given solumedrol 125 mg and duoneb. Started on BiPAP and brought to the ED. Started to become more altered on the way over and is now somnolent. Has had a productive cough for a few days too.        Prior to Admission medications   Medication Sig Start Date End Date Taking? Authorizing Provider  albuterol  (VENTOLIN  HFA) 108 (90 Base) MCG/ACT inhaler USE 2 INHALATIONS BY MOUTH EVERY 6 HOURS AS NEEDED FOR WHEEZING  OR SHORTNESS OF BREATH 10/08/23   Tobie Suzzane POUR, MD  doxycycline  (VIBRA -TABS) 100 MG tablet Take 1 tablet (100 mg total) by mouth 2 (two) times daily. 05/14/24   Darlean Ozell NOVAK, MD  famotidine  (PEPCID ) 20 MG tablet One after supper 03/18/24   Darlean Ozell NOVAK, MD  ipratropium-albuterol  (DUONEB) 0.5-2.5 (3) MG/3ML SOLN Take 3 mLs by nebulization every 6 (six) hours as needed. 05/14/24   Darlean Ozell NOVAK, MD  Misc. Devices MISC Blood pressure cuff/device - 1. ICD10: I10 06/14/22   Tobie Suzzane POUR, MD  pantoprazole  (PROTONIX ) 40 MG tablet Take 1 tablet (40 mg total) by mouth daily. Take 30-60 min before first meal of the day 03/18/24   Darlean Ozell NOVAK, MD  predniSONE  (DELTASONE ) 10 MG tablet Take  4 each am x 2 days,   2 each am x 2 days,  1 each am x 2 days and stop 05/14/24   Wert, Michael B, MD    Allergies: Patient has no known allergies.    Review of Systems  Updated Vital Signs BP (!) 166/80   Pulse 84   Temp (!) 94.8 F (34.9 C)   Resp (!) 22   Ht 5' 11 (1.803 m)   Wt 68 kg    SpO2 100%   BMI 20.92 kg/m   Physical Exam Constitutional:      General: He is in acute distress.     Appearance: He is ill-appearing.     Comments: Altered.  Occasionally withdraws to pain but not responding to commands.  Nonverbal.  Eyes:     Pupils: Pupils are equal, round, and reactive to light.  Cardiovascular:     Rate and Rhythm: Regular rhythm. Tachycardia present.     Pulses: Normal pulses.     Heart sounds: Normal heart sounds.  Pulmonary:     Effort: Respiratory distress present.     Breath sounds: Wheezing present.     Comments: On BiPAP Musculoskeletal:     Right lower leg: Edema present.     Left lower leg: Edema present.     (all labs ordered are listed, but only abnormal results are displayed) Labs Reviewed  BASIC METABOLIC PANEL WITH GFR - Abnormal; Notable for the following components:      Result Value   Glucose, Bld 244 (*)    Calcium  8.8 (*)    All other components within normal limits  CBC WITH DIFFERENTIAL/PLATELET - Abnormal; Notable for the  following components:   WBC 12.5 (*)    MCV 100.2 (*)    Monocytes Absolute 1.1 (*)    Eosinophils Absolute 0.9 (*)    All other components within normal limits  BRAIN NATRIURETIC PEPTIDE - Abnormal; Notable for the following components:   B Natriuretic Peptide 564.0 (*)    All other components within normal limits  BLOOD GAS, ARTERIAL - Abnormal; Notable for the following components:   pH, Arterial 7.25 (*)    pCO2 arterial 58 (*)    pO2, Arterial 344 (*)    Acid-base deficit 2.8 (*)    All other components within normal limits  CBG MONITORING, ED - Abnormal; Notable for the following components:   Glucose-Capillary 240 (*)    All other components within normal limits  I-STAT CHEM 8, ED - Abnormal; Notable for the following components:   Glucose, Bld 246 (*)    All other components within normal limits  TROPONIN I (HIGH SENSITIVITY) - Abnormal; Notable for the following components:   Troponin I  (High Sensitivity) 79 (*)    All other components within normal limits  RESP PANEL BY RT-PCR (RSV, FLU A&B, COVID)  RVPGX2  CULTURE, BLOOD (ROUTINE X 2)  CULTURE, BLOOD (ROUTINE X 2)  LACTIC ACID, PLASMA  TROPONIN I (HIGH SENSITIVITY)    EKG: EKG Interpretation Date/Time:  Monday June 01 2024 20:50:36 EDT Ventricular Rate:  123 PR Interval:  129 QRS Duration:  135 QT Interval:  323 QTC Calculation: 462 R Axis:   -78  Text Interpretation: Sinus tachycardia Probable left atrial enlargement RBBB and LAFB ST elevation, consider inferior injury Confirmed by Yolande Charleston [phone]) on 06/01/2024 9:55:18 PM  Radiology: CT Head Wo Contrast Result Date: 06/01/2024 CLINICAL DATA:  Shortness of breath. EXAM: CT HEAD WITHOUT CONTRAST TECHNIQUE: Contiguous axial images were obtained from the base of the skull through the vertex without intravenous contrast. RADIATION DOSE REDUCTION: This exam was performed according to the departmental dose-optimization program which includes automated exposure control, adjustment of the mA and/or kV according to patient size and/or use of iterative reconstruction technique. COMPARISON:  None Available. FINDINGS: Brain: There is generalized cerebral atrophy with widening of the extra-axial spaces and ventricular dilatation. There are areas of decreased attenuation within the white matter tracts of the supratentorial brain, consistent with microvascular disease changes. Vascular: Marked severity bilateral cavernous carotid calcification is noted. Skull: Normal. Negative for fracture or focal lesion. Sinuses/Orbits: Mild bilateral ethmoid sinus and marked severity bilateral nasal mucosal thickening is seen. Other: None. IMPRESSION: 1. Generalized cerebral atrophy with widening of the extra-axial spaces and ventricular dilatation. 2. Bilateral ethmoid sinus and marked severity bilateral nasal mucosal thickening. Electronically Signed   By: Suzen Dials M.D.   On:  06/01/2024 22:59   CT Angio Chest PE W and/or Wo Contrast Result Date: 06/01/2024 CLINICAL DATA:  Shortness of breath. EXAM: CT ANGIOGRAPHY CHEST WITH CONTRAST TECHNIQUE: Multidetector CT imaging of the chest was performed using the standard protocol during bolus administration of intravenous contrast. Multiplanar CT image reconstructions and MIPs were obtained to evaluate the vascular anatomy. RADIATION DOSE REDUCTION: This exam was performed according to the departmental dose-optimization program which includes automated exposure control, adjustment of the mA and/or kV according to patient size and/or use of iterative reconstruction technique. CONTRAST:  75mL OMNIPAQUE IOHEXOL 350 MG/ML SOLN COMPARISON:  July 25, 2022 FINDINGS: Cardiovascular: There is marked severity calcification of the thoracic aorta without evidence of aortic aneurysm or dissection. Satisfactory opacification of the pulmonary  arteries to the segmental level. No evidence of pulmonary embolism. Normal heart size. There is marked severity coronary artery calcification. No pericardial effusion. Mediastinum/Nodes: Endotracheal and orogastric tubes are in place. No enlarged mediastinal, hilar, or axillary lymph nodes. A mild amount of heterogeneous low-attenuation is seen within the posterior aspect of the trachea at the level of the carina and proximal left mainstem bronchus. The thyroid  gland and esophagus demonstrate no significant findings. Lungs/Pleura: Mild lingular and mild bibasilar linear scarring and/or atelectasis is seen. Mild to moderate severity bronchiectasis is also noted within the bilateral lower lobes. No pleural effusion or pneumothorax is identified. Upper Abdomen: No acute abnormality. Musculoskeletal: Multilevel degenerative changes are seen throughout the thoracic spine. Review of the MIP images confirms the above findings. IMPRESSION: 1. No evidence of pulmonary embolism. 2. Mild lingular and mild bibasilar linear  scarring and/or atelectasis. 3. Mild to moderate severity bilateral lower lobe bronchiectasis. 4. Marked severity coronary artery calcification. 5. Low attenuation within the trachea which may represent a mild amount of mucus. 6. Aortic atherosclerosis. Electronically Signed   By: Suzen Dials M.D.   On: 06/01/2024 22:57   DG Abdomen 1 View Result Date: 06/01/2024 EXAM: 1 VIEW XRAY OF THE ABDOMEN 06/01/2024 09:26:00 PM COMPARISON: None available. CLINICAL HISTORY: og tube. FINDINGS: LINES, TUBES AND DEVICES: Enteric tube in place with tip in the stomach. Side port is not identified. BOWEL: Nonobstructive bowel gas pattern. SOFT TISSUES: No opaque urinary calculi. BONES: No acute osseous abnormality. IMPRESSION: 1. Enteric tube tip in the stomach with side port not identified. Electronically signed by: Norman Gatlin MD 06/01/2024 09:35 PM EDT RP Workstation: [id number]   DG Chest Portable 1 View Result Date: 06/01/2024 EXAM: 1 VIEW(S) XRAY OF THE CHEST 06/01/2024 09:25:00 PM COMPARISON: 03/22/2024 CLINICAL HISTORY: post intubation. post intubation FINDINGS: LINES, TUBES AND DEVICES: Endotracheal tube with tip 6 cm above the carina. Enteric tube coursing below the hemidiaphragm. LUNGS AND PLEURA: Lung hyperinflation. No focal pulmonary opacity. No pulmonary edema. No pleural effusion. No pneumothorax. HEART AND MEDIASTINUM: Aortic calcification. No acute abnormality of the cardiac and mediastinal silhouettes. BONES AND SOFT TISSUES: No acute osseous abnormality. IMPRESSION: 1. Endotracheal tube tip 6 cm above the carina. 2. Lung hyperinflation. Electronically signed by: Norman Gatlin MD 06/01/2024 09:34 PM EDT RP Workstation: [id number]     Procedure Name: Intubation Date/Time: 06/01/2024 9:17 PM  Performed by: Yolande Lamar BROCKS, MDPre-anesthesia Checklist: Patient identified, Emergency Drugs available and Suction available Oxygen  Delivery Method: Ambu bag Preoxygenation: Pre-oxygenation with  100% oxygen  Induction Type: IV induction Laryngoscope Size: Glidescope and 4 Grade View: Grade I Tube size: 8.0 mm Number of attempts: 1 Airway Equipment and Method: Rigid stylet and Video-laryngoscopy Placement Confirmation: ETT inserted through vocal cords under direct vision Secured at: 22 cm Tube secured with: ETT holder Dental Injury: Teeth and Oropharynx as per pre-operative assessment         EMERGENCY DEPARTMENT US  CARDIAC EXAM Study: Limited Ultrasound of the Heart and Pericardium  INDICATIONS:Dyspnea Multiple views of the heart and pericardium were obtained in real-time with a multi-frequency probe.  PERFORMED AB:Fbdzoq IMAGES ARCHIVED?: No LIMITATIONS:  Body habitus VIEWS USED: Subcostal 4 chamber and Inferior Vena Cava INTERPRETATION: Cardiac activity present, Pericardial effusioin absent, Increased contractility, and IVC flat   EMERGENCY DEPARTMENT US  LUNG EXAM Study: Limited Ultrasound of the Lung and Thorax  INDICATIONS: Dyspnea Multiple views of both lungs using sagittal orientation were obtained.  PERFORMED BY: Myself IMAGES ARCHIVED?: No LIMITATIONS: Respiratory distress VIEWS USED: Anterior lung  fields INTERPRETATION: No pneumothorax, No pulmonary edema       Medications Ordered in the ED  propofol (DIPRIVAN) 1000 MG/100ML infusion (10 mcg/kg/min  68 kg Intravenous Rate/Dose Change 06/01/24 2202)  fentaNYL 10 mcg/ml infusion (  Not Given 06/01/24 2139)  0.9 %  sodium chloride infusion (has no administration in time range)  norepinephrine (LEVOPHED) 4mg  in 250mL (0.016 mg/mL) premix infusion (0 mcg/min Intravenous Stopped 06/01/24 2242)  ketamine 50 mg in normal saline 5 mL (10 mg/mL) syringe (100 mg Intravenous Not Given 06/01/24 2202)  ketamine (KETALAR) 10 MG/ML injection (100 mg  Given by Other 06/01/24 2059)  albuterol  (PROVENTIL ) (2.5 MG/3ML) 0.083% nebulizer solution (10 mg/hr Nebulization Given 06/01/24 2115)  ipratropium (ATROVENT)  nebulizer solution 0.5 mg (0.5 mg Nebulization Given 06/01/24 2137)  fentaNYL (SUBLIMAZE) injection 50 mcg (50 mcg Intravenous Given 06/01/24 2135)  vancomycin (VANCOCIN) IVPB 1000 mg/200 mL premix (0 mg Intravenous Stopped 06/01/24 2307)  ceFEPIme (MAXIPIME) 2 g in sodium chloride 0.9 % 100 mL IVPB (0 g Intravenous Stopped 06/01/24 2210)  sodium chloride 0.9 % bolus 1,000 mL (0 mLs Intravenous Stopped 06/01/24 2210)  iohexol (OMNIPAQUE) 350 MG/ML injection 75 mL (75 mLs Intravenous Contrast Given 06/01/24 2219)  rocuronium (ZEMURON) injection 100 mg (100 mg Intravenous Given by Other 06/01/24 2058)  sodium chloride 0.9 % bolus 1,000 mL (1,000 mLs Intravenous New Bag/Given 06/01/24 2246)    Clinical Course as of 06/01/24 2315  Mon Jun 01, 2024  2115 Full code verified with wife Martin Ford [RP]  2155 Discussed with Dr. Maree from the ICU for admission [RP]    Clinical Course User Index [RP] Yolande Lamar BROCKS, MD                                 Medical Decision Making Amount and/or Complexity of Data Reviewed Labs: ordered. Radiology: ordered.  Risk Prescription drug management. Decision regarding hospitalization.   Martin Ford is a 88 year old male history of COPD presenting in respiratory distress  Initial Ddx:  COPD exacerbation, hypoxic respiratory failure, hypercapnic respiratory failure, CHF, hypertensive emergency, PE  MDM/Course:  88 year old male with COPD presenting respiratory distress.  Was placed on BiPAP by EMS.  Unfortunately continued to decline.  Now is comatose.  Called his wife and did confirm that he is full code.  Will proceed with intubation with ketamine and rocuronium.  Patient was initially hypertensive but after starting sedation actually became hypotensive.  Was given some IV fluids and transiently required Levophed.  Was able to be weaned off of the Levophed.  They cultured him and give him antibiotics in case of pneumonia.  Chest x-ray does appear clear though.   Did have a CT head and CTA which did not show evidence of PE or other acute abnormality.  Bedside ultrasound showed hyperdynamic cardiac function.  Suspect that he had a hypoxic and hypercapnic respiratory failure from his COPD and likely had some insensible losses that led to dehydration.  Upon re-evaluation was able to be stabilized on the vent.  Started on continuous albuterol .  Given Solu-Medrol in the field.  Admitted to the [facility], ICU.  This patient presents to the ED for concern of complaints listed in HPI, this involves an extensive number of treatment options, and is a complaint that carries with it a high risk of complications and morbidity. Disposition including potential need for admission considered.   Dispo: ICU  Additional history obtained  from spouse Records reviewed Outpatient Clinic Notes The following labs were independently interpreted: Chemistry and show Hyperglycemia I independently reviewed the following imaging with scope of interpretation limited to determining acute life threatening conditions related to emergency care: Chest x-ray and agree with the radiologist interpretation with the following exceptions: none I personally reviewed and interpreted cardiac monitoring: normal sinus rhythm  I personally reviewed and interpreted the pt's EKG: see above for interpretation  I have reviewed the patients home medications and made adjustments as needed Consults: Critical care Social Determinants of health:  Geriatric  CRITICAL CARE Performed by: Lamar JAYSON Shan   Total critical care time: 60 minutes  Critical care time was exclusive of separately billable procedures and treating other patients.  Critical care was necessary to treat or prevent imminent or life-threatening deterioration.  Critical care was time spent personally by me on the following activities: development of treatment plan with patient and/or surrogate as well as nursing, discussions with  consultants, evaluation of patient's response to treatment, examination of patient, obtaining history from patient or surrogate, ordering and performing treatments and interventions, ordering and review of laboratory studies, ordering and review of radiographic studies, pulse oximetry and re-evaluation of patient's condition.   Portions of this note were generated with Scientist, clinical (histocompatibility and immunogenetics). Dictation errors may occur despite best attempts at proofreading.     Final diagnoses:  Acute respiratory failure with hypoxia (HCC)  Acute respiratory failure with hypoxia and hypercapnia (HCC)  Glasgow coma scale total score 3-8, in the field (EMT or ambulance) [facility])    ED Discharge Orders     None          Shan Lamar JAYSON, MD 06/01/24 2315

## 2024-06-02 ENCOUNTER — Encounter (HOSPITAL_COMMUNITY): Payer: Self-pay

## 2024-06-02 DIAGNOSIS — J9620 Acute and chronic respiratory failure, unspecified whether with hypoxia or hypercapnia: Secondary | ICD-10-CM | POA: Diagnosis present

## 2024-06-02 DIAGNOSIS — J9621 Acute and chronic respiratory failure with hypoxia: Secondary | ICD-10-CM | POA: Diagnosis not present

## 2024-06-02 DIAGNOSIS — E440 Moderate protein-calorie malnutrition: Secondary | ICD-10-CM | POA: Insufficient documentation

## 2024-06-02 DIAGNOSIS — J9622 Acute and chronic respiratory failure with hypercapnia: Secondary | ICD-10-CM | POA: Diagnosis not present

## 2024-06-02 DIAGNOSIS — J441 Chronic obstructive pulmonary disease with (acute) exacerbation: Secondary | ICD-10-CM | POA: Diagnosis not present

## 2024-06-02 LAB — BASIC METABOLIC PANEL WITH GFR
Anion gap: 11 (ref 5–15)
BUN: 15 mg/dL (ref 8–23)
CO2: 23 mmol/L (ref 22–32)
Calcium: 8.7 mg/dL — ABNORMAL LOW (ref 8.9–10.3)
Chloride: 102 mmol/L (ref 98–111)
Creatinine, Ser: 0.79 mg/dL (ref 0.61–1.24)
GFR, Estimated: >60 mL/min (ref >60)
Glucose, Bld: 136 mg/dL — ABNORMAL HIGH (ref 70–99)
Potassium: 4.4 mmol/L (ref 3.5–5.1)
Sodium: 136 mmol/L (ref 135–145)

## 2024-06-02 LAB — GLUCOSE, CAPILLARY
Glucose-Capillary: 130 mg/dL — ABNORMAL HIGH (ref 70–99)
Glucose-Capillary: 130 mg/dL — ABNORMAL HIGH (ref 70–99)
Glucose-Capillary: 133 mg/dL — ABNORMAL HIGH (ref 70–99)
Glucose-Capillary: 140 mg/dL — ABNORMAL HIGH (ref 70–99)
Glucose-Capillary: 143 mg/dL — ABNORMAL HIGH (ref 70–99)
Glucose-Capillary: 149 mg/dL — ABNORMAL HIGH (ref 70–99)
Glucose-Capillary: 156 mg/dL — ABNORMAL HIGH (ref 70–99)
Glucose-Capillary: 192 mg/dL — ABNORMAL HIGH (ref 70–99)

## 2024-06-02 LAB — COMPREHENSIVE METABOLIC PANEL WITH GFR
ALT: 15 U/L (ref 0–44)
AST: 20 U/L (ref 15–41)
Albumin: 3.2 g/dL — ABNORMAL LOW (ref 3.5–5.0)
Alkaline Phosphatase: 47 U/L (ref 38–126)
Anion gap: 13 (ref 5–15)
BUN: 13 mg/dL (ref 8–23)
CO2: 22 mmol/L (ref 22–32)
Calcium: 8.5 mg/dL — ABNORMAL LOW (ref 8.9–10.3)
Chloride: 103 mmol/L (ref 98–111)
Creatinine, Ser: 1.08 mg/dL (ref 0.61–1.24)
GFR, Estimated: >60 mL/min (ref >60)
Glucose, Bld: 163 mg/dL — ABNORMAL HIGH (ref 70–99)
Potassium: 4.3 mmol/L (ref 3.5–5.1)
Sodium: 138 mmol/L (ref 135–145)
Total Bilirubin: 2.1 mg/dL — ABNORMAL HIGH (ref 0.0–1.2)
Total Protein: 5.8 g/dL — ABNORMAL LOW (ref 6.5–8.1)

## 2024-06-02 LAB — MAGNESIUM
Magnesium: 1.8 mg/dL (ref 1.7–2.4)
Magnesium: 2 mg/dL (ref 1.7–2.4)

## 2024-06-02 LAB — CBC
HCT: 35 % — ABNORMAL LOW (ref 39.0–52.0)
HCT: 35.4 % — ABNORMAL LOW (ref 39.0–52.0)
Hemoglobin: 11.7 g/dL — ABNORMAL LOW (ref 13.0–17.0)
Hemoglobin: 11.7 g/dL — ABNORMAL LOW (ref 13.0–17.0)
MCH: 31.6 pg (ref 26.0–34.0)
MCH: 31 pg (ref 26.0–34.0)
MCHC: 33.4 g/dL (ref 30.0–36.0)
MCHC: 33.1 g/dL (ref 30.0–36.0)
MCV: 94.6 fL (ref 80.0–100.0)
MCV: 93.7 fL (ref 80.0–100.0)
Platelets: 271 K/uL (ref 150–400)
Platelets: 303 K/uL (ref 150–400)
RBC: 3.7 MIL/uL — ABNORMAL LOW (ref 4.22–5.81)
RBC: 3.78 MIL/uL — ABNORMAL LOW (ref 4.22–5.81)
RDW: 13.2 % (ref 11.5–15.5)
RDW: 13.2 % (ref 11.5–15.5)
WBC: 11.5 K/uL — ABNORMAL HIGH (ref 4.0–10.5)
WBC: 8 K/uL (ref 4.0–10.5)
nRBC: 0 % (ref 0.0–0.2)
nRBC: 0 % (ref 0.0–0.2)

## 2024-06-02 LAB — PHOSPHORUS
Phosphorus: 2.6 mg/dL (ref 2.5–4.6)
Phosphorus: 3.1 mg/dL (ref 2.5–4.6)

## 2024-06-02 LAB — BLOOD GAS, VENOUS
Acid-Base Excess: 2.8 mmol/L — ABNORMAL HIGH (ref 0.0–2.0)
Bicarbonate: 26.1 mmol/L (ref 20.0–28.0)
O2 Saturation: 84 %
Patient temperature: 37.2
pCO2, Ven: 35 mmHg — ABNORMAL LOW (ref 44–60)
pH, Ven: 7.48 — ABNORMAL HIGH (ref 7.25–7.43)
pO2, Ven: 48 mmHg — ABNORMAL HIGH (ref 32–45)

## 2024-06-02 LAB — TROPONIN I (HIGH SENSITIVITY): Troponin I (High Sensitivity): 79 ng/L — ABNORMAL HIGH (ref <18)

## 2024-06-02 LAB — HEMOGLOBIN A1C
Hgb A1c MFr Bld: 4.5 % — ABNORMAL LOW (ref 4.8–5.6)
Mean Plasma Glucose: 82.45 mg/dL

## 2024-06-02 LAB — LACTIC ACID, PLASMA
Lactic Acid, Venous: 2.2 mmol/L (ref 0.5–1.9)
Lactic Acid, Venous: 1.7 mmol/L (ref 0.5–1.9)

## 2024-06-02 LAB — MRSA NEXT GEN BY PCR, NASAL: MRSA by PCR Next Gen: NOT DETECTED

## 2024-06-02 MED ORDER — HALOPERIDOL LACTATE 5 MG/ML IJ SOLN: 4.0000 mg | Freq: Four times a day (QID) | INTRAMUSCULAR | Status: DC | PRN

## 2024-06-02 MED ORDER — IPRATROPIUM-ALBUTEROL 0.5-2.5 (3) MG/3ML IN SOLN
3.0000 mL | RESPIRATORY_TRACT | Status: DC | PRN
  Administered 2024-06-11 – 2024-06-17 (×5): 3 mL via RESPIRATORY_TRACT
  Filled 2024-06-02 (×5): qty 3

## 2024-06-02 MED ORDER — OSMOLITE 1.5 CAL PO LIQD
1000.0000 mL | ORAL | Status: DC
  Administered 2024-06-02 – 2024-06-05 (×3): 1000 mL
  Filled 2024-06-02 (×6): qty 1000

## 2024-06-02 MED ORDER — DOCUSATE SODIUM 50 MG/5ML PO LIQD
100.0000 mg | Freq: Two times a day (BID) | ORAL | Status: DC
  Administered 2024-06-02 – 2024-06-05 (×7): 100 mg
  Filled 2024-06-02 (×7): qty 10

## 2024-06-02 MED ORDER — FENTANYL BOLUS VIA INFUSION
50.0000 ug | INTRAVENOUS | Status: DC | PRN
  Administered 2024-06-02 (×2): 50 ug via INTRAVENOUS

## 2024-06-02 MED ORDER — ONDANSETRON HCL 4 MG/2ML IJ SOLN: 4.0000 mg | Freq: Four times a day (QID) | INTRAMUSCULAR | Status: DC | PRN

## 2024-06-02 MED ORDER — PHENYLEPHRINE 80 MCG/ML (10ML) SYRINGE FOR IV PUSH (FOR BLOOD PRESSURE SUPPORT)
PREFILLED_SYRINGE | INTRAVENOUS | Status: AC
  Filled 2024-06-02: qty 10

## 2024-06-02 MED ORDER — HALOPERIDOL LACTATE 5 MG/ML IJ SOLN
INTRAMUSCULAR | Status: AC
  Administered 2024-06-02: 5 mg via INTRAVENOUS
  Filled 2024-06-02: qty 1

## 2024-06-02 MED ORDER — HEPARIN SODIUM (PORCINE) 5000 UNIT/ML IJ SOLN
5000.0000 [IU] | Freq: Three times a day (TID) | INTRAMUSCULAR | Status: DC
  Administered 2024-06-02 – 2024-06-05 (×11): 5000 [IU] via SUBCUTANEOUS
  Filled 2024-06-02 (×10): qty 1

## 2024-06-02 MED ORDER — FENTANYL 2500MCG IN NS 250ML (10MCG/ML) PREMIX INFUSION
0.0000 ug/h | INTRAVENOUS | Status: DC
  Administered 2024-06-02: 150 ug/h via INTRAVENOUS
  Administered 2024-06-02: 50 ug/h via INTRAVENOUS
  Administered 2024-06-03: 150 ug/h via INTRAVENOUS
  Administered 2024-06-04: 200 ug/h via INTRAVENOUS
  Filled 2024-06-02 (×4): qty 250

## 2024-06-02 MED ORDER — IPRATROPIUM-ALBUTEROL 0.5-2.5 (3) MG/3ML IN SOLN
3.0000 mL | Freq: Four times a day (QID) | RESPIRATORY_TRACT | Status: DC
  Administered 2024-06-02 – 2024-06-05 (×12): 3 mL via RESPIRATORY_TRACT
  Filled 2024-06-02 (×12): qty 3

## 2024-06-02 MED ORDER — SODIUM CHLORIDE 0.9 % IV SOLN
1.0000 g | INTRAVENOUS | Status: DC
  Administered 2024-06-02: 1 g via INTRAVENOUS
  Filled 2024-06-02: qty 10

## 2024-06-02 MED ORDER — IPRATROPIUM-ALBUTEROL 0.5-2.5 (3) MG/3ML IN SOLN
3.0000 mL | Freq: Once | RESPIRATORY_TRACT | Status: AC
  Administered 2024-06-02: 3 mL via RESPIRATORY_TRACT
  Filled 2024-06-02: qty 3

## 2024-06-02 MED ORDER — FENTANYL CITRATE PF 50 MCG/ML IJ SOSY: 50.0000 ug | PREFILLED_SYRINGE | Freq: Once | INTRAMUSCULAR | Status: DC

## 2024-06-02 MED ORDER — MIDAZOLAM HCL 2 MG/2ML IJ SOLN
INTRAMUSCULAR | Status: AC
  Filled 2024-06-02: qty 4

## 2024-06-02 MED ORDER — POLYETHYLENE GLYCOL 3350 17 G PO PACK
17.0000 g | PACK | Freq: Every day | ORAL | Status: DC
  Administered 2024-06-02 – 2024-06-04 (×3): 17 g
  Filled 2024-06-02 (×3): qty 1

## 2024-06-02 MED ORDER — PANTOPRAZOLE SODIUM 40 MG IV SOLR
40.0000 mg | Freq: Every day | INTRAVENOUS | Status: DC
  Administered 2024-06-02 – 2024-06-05 (×4): 40 mg via INTRAVENOUS
  Filled 2024-06-02 (×4): qty 10

## 2024-06-02 MED ORDER — BUDESONIDE 0.25 MG/2ML IN SUSP
0.2500 mg | Freq: Two times a day (BID) | RESPIRATORY_TRACT | Status: DC
  Administered 2024-06-02 – 2024-06-21 (×38): 0.25 mg via RESPIRATORY_TRACT
  Filled 2024-06-02 (×39): qty 2

## 2024-06-02 MED ORDER — AZITHROMYCIN 500 MG PO TABS
500.0000 mg | ORAL_TABLET | Freq: Every day | ORAL | Status: AC
  Administered 2024-06-02 – 2024-06-04 (×3): 500 mg
  Filled 2024-06-02 (×4): qty 1

## 2024-06-02 MED ORDER — MIDAZOLAM HCL 2 MG/2ML IJ SOLN: 4.0000 mg | Freq: Once | INTRAMUSCULAR | Status: DC

## 2024-06-02 MED ORDER — FENTANYL BOLUS VIA INFUSION
100.0000 ug | INTRAVENOUS | Status: AC | PRN
  Administered 2024-06-03 (×6): 100 ug via INTRAVENOUS

## 2024-06-02 MED ORDER — METHYLPREDNISOLONE SODIUM SUCC 40 MG IJ SOLR
40.0000 mg | Freq: Two times a day (BID) | INTRAMUSCULAR | Status: DC
Start: 2024-06-02 — End: 2024-06-06
  Administered 2024-06-02 – 2024-06-06 (×9): 40 mg via INTRAVENOUS
  Filled 2024-06-02 (×9): qty 1

## 2024-06-02 MED ORDER — ORAL CARE MOUTH RINSE
15.0000 mL | OROMUCOSAL | Status: DC
  Administered 2024-06-02 – 2024-06-05 (×35): 15 mL via OROMUCOSAL

## 2024-06-02 MED ORDER — ORAL CARE MOUTH RINSE
15.0000 mL | OROMUCOSAL | Status: DC | PRN
  Administered 2024-06-10 – 2024-06-21 (×2): 15 mL via OROMUCOSAL

## 2024-06-02 MED ORDER — INSULIN ASPART 100 UNIT/ML IJ SOLN
0.0000 [IU] | INTRAMUSCULAR | Status: DC
  Administered 2024-06-02 (×4): 1 [IU] via SUBCUTANEOUS
  Administered 2024-06-02: 2 [IU] via SUBCUTANEOUS
  Administered 2024-06-03 (×5): 1 [IU] via SUBCUTANEOUS
  Administered 2024-06-03 – 2024-06-04 (×2): 2 [IU] via SUBCUTANEOUS
  Administered 2024-06-04: 3 [IU] via SUBCUTANEOUS
  Administered 2024-06-04: 1 [IU] via SUBCUTANEOUS
  Administered 2024-06-04: 2 [IU] via SUBCUTANEOUS
  Administered 2024-06-05: 3 [IU] via SUBCUTANEOUS
  Administered 2024-06-05: 5 [IU] via SUBCUTANEOUS
  Administered 2024-06-05: 1 [IU] via SUBCUTANEOUS
  Administered 2024-06-05: 2 [IU] via SUBCUTANEOUS
  Administered 2024-06-06 (×2): 1 [IU] via SUBCUTANEOUS

## 2024-06-02 MED ORDER — CHLORHEXIDINE GLUCONATE CLOTH 2 % EX PADS
6.0000 | MEDICATED_PAD | Freq: Every day | CUTANEOUS | Status: DC
  Administered 2024-06-02 – 2024-06-20 (×17): 6 via TOPICAL

## 2024-06-02 MED ORDER — FENTANYL BOLUS VIA INFUSION
50.0000 ug | INTRAVENOUS | Status: DC | PRN
  Administered 2024-06-02: 50 ug via INTRAVENOUS

## 2024-06-02 MED ORDER — THIAMINE MONONITRATE 100 MG PO TABS
100.0000 mg | ORAL_TABLET | Freq: Every day | ORAL | Status: DC
  Administered 2024-06-02 – 2024-06-05 (×4): 100 mg
  Filled 2024-06-02 (×4): qty 1

## 2024-06-02 MED ORDER — HALOPERIDOL LACTATE 5 MG/ML IJ SOLN
5.0000 mg | Freq: Four times a day (QID) | INTRAMUSCULAR | Status: DC | PRN
  Administered 2024-06-03 – 2024-06-07 (×3): 5 mg via INTRAVENOUS
  Filled 2024-06-02 (×3): qty 1

## 2024-06-02 MED ORDER — PROSOURCE TF20 ENFIT COMPATIBL EN LIQD
60.0000 mL | Freq: Every day | ENTERAL | Status: DC
  Administered 2024-06-02 – 2024-06-05 (×4): 60 mL
  Filled 2024-06-02 (×4): qty 60

## 2024-06-02 MED ORDER — MAGNESIUM SULFATE 2 GM/50ML IV SOLN
2.0000 g | Freq: Once | INTRAVENOUS | Status: AC
  Administered 2024-06-02: 2 g via INTRAVENOUS
  Filled 2024-06-02: qty 50

## 2024-06-02 NOTE — TOC CM/SW Note (Signed)
 Transition of Care [facility]) - Inpatient Brief Assessment   Patient Details  Name: Wofford Stratton MRN: [id number] Date of Birth: 06-Dec-1935  Transition of Care [facility]) CM/SW Contact:    Lauraine FORBES Saa, LCSWA Phone Number: 06/02/2024, 9:01 AM   Clinical Narrative:  9:01 AM Per chart review, patient has a PCP and insurance. Patient does not have SNF or HH history. Patient has DME (oxygen  tanks, oxygen  concentrators) history with [facility] and [facility]. Patient's preferred pharmacy's are [facility] [address] and [facility] [place]. Patient is currently intubated and unable to answer SDOH questions. Patient did not have SDOH needs as of September 29th, 2023. No TOC needs identified at this time. TOC will continue to follow and be available to assist.  Transition of Care Asessment: Insurance and Status: Insurance coverage has been reviewed Patient has primary care physician: Yes Home environment has been reviewed: Private Residence Prior level of function:: N/A Prior/Current Home Services: No current home services Social Drivers of Health Review: SDOH reviewed no interventions necessary (As of September 29th, 2023. Patient is unable to answer.) Readmission risk has been reviewed: Yes (Currently Yellow 17%) Transition of care needs: no transition of care needs at this time

## 2024-06-02 NOTE — Progress Notes (Signed)
 [facility] Physician-Brief Progress Note Patient Name: Tayshun Gappa DOB: October 05, 1935 MRN: [id number]   Date of Service  06/02/2024  HPI/Events of Note  88 y.o. male who has a PMH as below including but not limited to COPD Stage 3 by GOLD (PFT's Dec 2022 with FEV1 0.95 (38%), ratio 0.48) and chronically on 2L O2.  No attempts of weaning were made today, although the patient is requiring 4 staff to keep the patient held down in each limb due to the significant agitation despite propofol and fentanyl.  Patient is now following commands, but does nod intermittently, unable to redirect the patient, spontaneous tachypnea.  eICU Interventions  Given mental status, poor candidate for extubation at this time.  Add Haldol as needed  Maintain current sedation, increase fentanyl as needed to 100 mcg every 30 minutes  Add Posey belt and four extremity restraints     Intervention Category Minor Interventions: Agitation / anxiety - evaluation and management  Shaindel Sweeten 06/02/2024, 7:33 PM

## 2024-06-02 NOTE — ED Provider Notes (Signed)
 [facility] is here for transport.  They are requesting a fentanyl drip.  Patient is on a propofol drip and appears to be comfortably sedated.  Vital signs improved with no longer having tachycardia and blood pressure is stable off of IV pressors.  On my assessment, RASS is -3 as patient will open his eyes momentarily to voice.  Otherwise appears comfortable.  Will give 1 dose of fentanyl prior to transport request but do not feel he requires a fentanyl drip.  DIscussed with [facility] at [facility].   Bari Charmaine FALCON, MD 06/02/24 [phone]

## 2024-06-02 NOTE — Progress Notes (Signed)
 RT attempted sputum culture x2 at this time with no success. RT will attempt later.

## 2024-06-02 NOTE — Progress Notes (Signed)
 Initial Nutrition Assessment  DOCUMENTATION CODES:  Non-severe (moderate) malnutrition in context of chronic illness  INTERVENTION:  Initiate tube feeding via OGT: Osmolite 1.2 at 45 ml/h (1080 ml per day) Start at 25 and advance by 10mL every 12 hours to reach goal Prosource TF20 60 ml 1x/d Provides 1700 kcal, 87 gm protein, 823 ml free water daily Pt is at risk for refeeding syndrome given malnutrition. Monitor magnesium and phosphorus daily x 2 days, MD to replete as needed. 100mg  thiamine x 5 days   NUTRITION DIAGNOSIS:  Moderate Malnutrition related to chronic illness (COPD) as evidenced by moderate fat depletion, moderate muscle depletion.  GOAL:  Patient will meet greater than or equal to 90% of their needs  MONITOR:  TF tolerance, Vent status, I & O's, Labs  REASON FOR ASSESSMENT:  Ventilator    ASSESSMENT:  Pt with hx of COPD and HTN presented to ED with respiratory distress. Workup suggestive of COPD exacerbation, intubated in ED.    Patient is currently intubated on ventilator support. No family present to provide a hx.   Pt discussed during ICU rounds and with RN and MD. No plans to extubate today. On exam, pt with muscle and fat deficits throughout body. Also note weight loss over the last few months. Ok to start enteral feeds. Pt will be at risk for refeeding.   MV: 12.1 L/min Temp (24hrs), Avg:98 F (36.7 C), Min:94.8 F (34.9 C), Max:100.2 F (37.9 C) MAP (cuff): 57-45mmHg this afternoon  Propofol: 12.24 ml/hr  Admit weight: 68kg ? accuracy  Current weight: 74.2 kg  7% weight loss noted in the last 3 months, not severe but concerning for age. Weight loss may be more significant as pt has pitting edema to the BLE.    Intake/Output Summary (Last 24 hours) at 06/02/2024 1541 Last data filed at 06/02/2024 1500 Gross per 24 hour  Intake 3031.56 ml  Output 205 ml  Net 2826.56 ml  Net IO Since Admission: 2,826.56 mL [06/02/24 1541]  Drains/Lines: OGT  (gastric) UOP since admission  Nutritionally Relevant Medications: Scheduled Meds:  azithromycin   500 mg Per Tube Daily   docusate  100 mg Per Tube BID   insulin aspart  0-9 Units Subcutaneous Q4H   methylPREDNISolone  40 mg Intravenous Q12H   pantoprazoleIV  40 mg Intravenous QHS   polyethylene glycol  17 g Per Tube Daily   Continuous Infusions:  sodium chloride 20 mL/hr at 06/02/24 1200   norepinephrine (LEVOPHED) Adult infusion 2 mcg/min (06/02/24 1200)   propofol (DIPRIVAN) infusion 20 mcg/kg/min (06/02/24 1200)   PRN Meds: ondansetron   Labs Reviewed: CBG ranges from 140-240 mg/dL over the last 24 hours HgbA1c 4.5%  NUTRITION - FOCUSED PHYSICAL EXAM: Flowsheet Row Most Recent Value  Orbital Region Mild depletion  Upper Arm Region Severe depletion  Thoracic and Lumbar Region Moderate depletion  Buccal Region Mild depletion  Temple Region Moderate depletion  Clavicle Bone Region Moderate depletion  Clavicle and Acromion Bone Region Severe depletion  Scapular Bone Region Severe depletion  Dorsal Hand Mild depletion  Patellar Region Moderate depletion  Anterior Thigh Region Moderate depletion  Posterior Calf Region Mild depletion  Edema (RD Assessment) Moderate  [BLE]  Hair Reviewed  Eyes Reviewed  Mouth Reviewed  Skin Reviewed  Nails Reviewed    Diet Order:   Diet Order             Diet NPO time specified  Diet effective now  EDUCATION NEEDS:  Not appropriate for education at this time  Skin:  Skin Assessment: Reviewed RN Assessment  Last BM:  unsure  Height:  Ht Readings from Last 1 Encounters:  06/01/24 5' 11 (1.803 m)    Weight:  Wt Readings from Last 1 Encounters:  06/02/24 74.2 kg    Ideal Body Weight:  78.2 kg  BMI:  Body mass index is 22.81 kg/m.  Estimated Nutritional Needs:  Kcal:  1700-1900 kcal/d Protein:  80-100g/d Fluid:  >/=1.8L/d    Vernell Lukes, RD, LDN, CNSC Registered Dietitian  II Please reach out via secure chat

## 2024-06-02 NOTE — Progress Notes (Addendum)
 eLink Physician-Brief Progress Note Patient Name: Martin Ford DOB: 1936-08-31 MRN: [id number]   Date of Service  06/02/2024  HPI/Events of Note  eICU Brief new admit note: 55 male with hx of former smoker, GOLD stage 3 COPD/emphysema, HTN, HLD, Hypothyroidism, PVD, pre Diabetes transferred from OSH for  Acute on chronic type 2 respiratory failure, from COPD/Emphysema exacerbation. No obvious pneumonia. Bronchiectasis. No obvious CHF, could be fluid down. CTA no PE. Received vanc,cefepime, nebs, steroids, on Ventilator. Sedated. S/p 2 lit fluids from Outside ED.   Data: 7.25/58/344/25 from 21:30 Trop 79 x 2 same. BNP 564. WBC 12.5 Hg 14 LA 1.2 K 5 Cr 1.2 Glucose > 240 Viral neg PFT from 2022: reviewed; very severe COPD without bronchodilator response. FVC low also.  Echo from 01/2023: EF 65%. Moderate AS, mild elevated PA pressure.  CT head and CTA which did not show evidence of PE or other acute abnormality. Lower lobe bronchiectasis. Bedside ultrasound showed hyperdynamic cardiac function , may be dehydrated.   Camera: Just rolling in to ICU bed. Discussed with RN. Asking for: Agitated reaching to ET tube. Stat fentanyl 50 mcg and to go on drip. Wrist restraints ordered.  On lung protective ventilation.600/01/18/39%. On propofol.  Off of levophed. In synchrony with vent.    eICU Interventions  - Bilateral soft wrist restraints request from bed side RN to prevent self injury and harm while critically ill/on ventilator - sedation ordered. - allow permissive hypercapnia, to avoid barotrauma from air trapping due to severe COPD. - VTE: chemoprophylaxis - SUP - SAT/SBT-weaning daily in AM as tolerated/per protocol. - CBG goals < 180. Ssi.  if glucose > 180.   Ground CCM team been notified.      Intervention Category Major Interventions: Respiratory failure - evaluation and management Evaluation Type: New Patient Evaluation  [name] 06/02/2024, 12:55  AM  0200 Bedside RN requesting PRN Versed. Patient is very agitated and there are 4 RN's in room trying to keep him calm. Currenly on 40 of prop and 100 fentanyl. only has PRN fentanyl pushes every 2 hours ordered  Camera: Received fenta push, calm now, in synchrony. VS stable. - increased prn fentanyl to q30 mis. If not better call back.

## 2024-06-02 NOTE — Consult Note (Signed)
 NAME:  Martin Ford, MRN:  [id number], DOB:  [birth year], LOS: 1 ADMISSION DATE:  06/01/2024, CONSULTATION DATE:  06/01/24 REFERRING MD:  [facility]  CHIEF COMPLAINT:  Dyspnea   History of Present Illness:  Pt is encephelopathic; therefore, this HPI is obtained from chart review. Martin Ford is a 88 y.o. male who has a PMH as below including but not limited to COPD Stage 3 by GOLD (PFT's Dec 2022 with FEV1 0.95 (38%), ratio 0.48) and chronically on 2L O2. He sees Dr. Darlean and just saw him as an outpatient on 05/14/24 and was prescribed a prednisone  taper and 10 day course of Doxycycline .  On 9/29, he had worsening dyspnea despite increase in O2 from baseline 2L to 4L. He was taken to [facility] ED where he required intubation. He was given 1 dose of Vancomycin and Cefepime empirically.  CTA chest demonstrated Bronchiectasis, no PE. CT head demonstrated no acute issues, generalized atrophy and sinus thickening.  He was later transferred to [facility] for further management.  Pertinent  Medical History:  has Centrilobular emphysema (HCC); Essential tremor; Essential hypertension; Vitamin D deficiency; Mixed hyperlipidemia; Hypothyroidism; Prediabetes; PVD (peripheral vascular disease); Stage 3 severe COPD by GOLD classification (HCC); DOE (dyspnea on exertion); Chronic respiratory failure with hypoxia (HCC); Ascending aortic aneurysm; Refused influenza vaccine; Pulmonary nodule; COPD with acute exacerbation (HCC); COPD (chronic obstructive pulmonary disease) (HCC); and Acute and chronic respiratory failure (acute-on-chronic) (HCC) on their problem list.  Significant Hospital Events: Including procedures, antibiotic start and stop dates in addition to other pertinent events   9/29 admit.  Interim History / Subjective:  Currently sedated and intubated. Vent settings minimal.  Not stacking breaths.  Objective:  Blood pressure 124/60, pulse 82, temperature 100 F (37.8 C), resp. rate (!) 23, height 5' 11  (1.803 m), weight 74.2 kg, SpO2 100%.    Vent Mode: PRVC FiO2 (%):  [40 %-100 %] 40 % Set Rate:  [16 bmp-22 bmp] 20 bmp Vt Set:  [600 mL] 600 mL PEEP:  [5 cmH20-6 cmH20] 5 cmH20 Plateau Pressure:  [20 cmH20-21 cmH20] 20 cmH20   Intake/Output Summary (Last 24 hours) at 06/02/2024 0744 Last data filed at 06/02/2024 0445 Gross per 24 hour  Intake 2265.94 ml  Output 50 ml  Net 2215.94 ml   Filed Weights   06/01/24 2056 06/02/24 0412  Weight: 68 kg 74.2 kg     Physical Exam: General: on vent, resting in bed, in NAD. Neuro: Sedated to a RASS of -4 HEENT: [place]/AT. Sclerae anicteric. AC/NT. Cardiovascular: Heart rate regular in rate and rhythm no murmurs appreciated. Lungs: Clear to auscultation bilaterally. Abdomen: BS x 4, soft, NT/ND.  Musculoskeletal: No gross deformities, no edema.     Labs/imaging personally reviewed:  CTA chest 9/29 >   CT Angio Chest PE W and/or Wo Contrast (Accession [id number]) (Order [id number]) Imaging Date: 06/01/2024 Department: [facility]M MEDICAL ICU Released By/Authorizing: Yolande Lamar BROCKS, MD (auto-released)   Exam Status  Status  Final [99]   PACS Intelerad Image Link   Show images for CT Angio Chest PE W and/or Wo Contrast Related Results          CT Head Wo Contrast Final result 06/01/2024 10:35 PM      CLINICAL DATA:  Shortness of breath.  EXAM: CT HEAD WITHOUT CONTRAST  TECHNIQUE: Contiguous axial images were obtained from the base of the skull through the vertex without intravenous contrast.  RADIATION DOSE REDUCTION: This exam was performed according  to the ...   Study Result  Narrative & Impression  CLINICAL DATA:  Shortness of breath.   EXAM: CT ANGIOGRAPHY CHEST WITH CONTRAST   TECHNIQUE: Multidetector CT imaging of the chest was performed using the standard protocol during bolus administration of intravenous contrast. Multiplanar CT image reconstructions and MIPs were obtained to evaluate  the vascular anatomy.   RADIATION DOSE REDUCTION: This exam was performed according to the departmental dose-optimization program which includes automated exposure control, adjustment of the mA and/or kV according to patient size and/or use of iterative reconstruction technique.   CONTRAST:  75mL OMNIPAQUE IOHEXOL 350 MG/ML SOLN   COMPARISON:  July 25, 2022   FINDINGS: Cardiovascular: There is marked severity calcification of the thoracic aorta without evidence of aortic aneurysm or dissection. Satisfactory opacification of the pulmonary arteries to the segmental level. No evidence of pulmonary embolism. Normal heart size. There is marked severity coronary artery calcification. No pericardial effusion.   Mediastinum/Nodes: Endotracheal and orogastric tubes are in place. No enlarged mediastinal, hilar, or axillary lymph nodes. A mild amount of heterogeneous low-attenuation is seen within the posterior aspect of the trachea at the level of the carina and proximal left mainstem bronchus. The thyroid  gland and esophagus demonstrate no significant findings.   Lungs/Pleura: Mild lingular and mild bibasilar linear scarring and/or atelectasis is seen. Mild to moderate severity bronchiectasis is also noted within the bilateral lower lobes. No pleural effusion or pneumothorax is identified.   Upper Abdomen: No acute abnormality.   Musculoskeletal: Multilevel degenerative changes are seen throughout the thoracic spine.   Review of the MIP images confirms the above findings.   IMPRESSION: 1. No evidence of pulmonary embolism. 2. Mild lingular and mild bibasilar linear scarring and/or atelectasis. 3. Mild to moderate severity bilateral lower lobe bronchiectasis. 4. Marked severity coronary artery calcification. 5. Low attenuation within the trachea which may represent a mild amount of mucus. 6. Aortic atherosclerosis.     Electronically Signed   By: Suzen Dials M.D.    On: 06/01/2024 22:57            CT head 9/29 >   CT Head Wo Contrast (Accession [id number]) (Order [id number]) Imaging Date: 06/01/2024 Department: [facility]M MEDICAL ICU Released By/Authorizing: Yolande Lamar BROCKS, MD (auto-released)   Exam Status  Status  Final [99]   Narrative & Impression  CLINICAL DATA:  Shortness of breath.   EXAM: CT HEAD WITHOUT CONTRAST   TECHNIQUE: Contiguous axial images were obtained from the base of the skull through the vertex without intravenous contrast.   RADIATION DOSE REDUCTION: This exam was performed according to the departmental dose-optimization program which includes automated exposure control, adjustment of the mA and/or kV according to patient size and/or use of iterative reconstruction technique.   COMPARISON:  None Available.   FINDINGS: Brain: There is generalized cerebral atrophy with widening of the extra-axial spaces and ventricular dilatation. There are areas of decreased attenuation within the white matter tracts of the supratentorial brain, consistent with microvascular disease changes.   Vascular: Marked severity bilateral cavernous carotid calcification is noted.   Skull: Normal. Negative for fracture or focal lesion.   Sinuses/Orbits: Mild bilateral ethmoid sinus and marked severity bilateral nasal mucosal thickening is seen.   Other: None.   IMPRESSION: 1. Generalized cerebral atrophy with widening of the extra-axial spaces and ventricular dilatation. 2. Bilateral ethmoid sinus and marked severity bilateral nasal mucosal thickening.     Electronically Signed   By: Suzen Dials  M.D.   On: 06/01/2024 22:59         Assessment & Plan:   Acute on chronic hypoxic/hypercapnic respiratory failure 2/2 AECOPD - COPD Stage 3 by GOLD (PFT's Dec 2022 with FEV1 0.95 (38%), ratio 0.48) and chronically on 2L O2. S/p intubation in ED 9/29 and given 1 dose of Vanc/Cefepime  empirically. - LT VV. - Budesonide nebs and as needed and scheduled DuoNebs. -RPP pending - Solu-Medrol 40 mg IV twice daily.  Continue. - Azithromycin  for COPD exacerbation.  Chest x-ray negative. - Albuterol  PRN.  Hypotension - 2/2 sedation. - Low dose NE as needed for goal MAP . 65. - Wean NE as able. - Limit sedation as able.  Hyperglycemia - presumed 2/2 steroids. - SSI.  Moderate protein calorie malnutrition: - Tube feed.   Tried to call wife but unable to reach her on the phone.  Labs   CBC: Recent Labs  Lab 06/01/24 2105 06/01/24 2118 06/02/24 0438  WBC 12.5*  --  11.5*  NEUTROABS 7.6  --   --   HGB 13.6 14.3 11.7*  HCT 43.2 42.0 35.0*  MCV 100.2*  --  94.6  PLT 374  --  271    Basic Metabolic Panel: Recent Labs  Lab 06/01/24 2105 06/01/24 2118 06/02/24 0438  NA 136 138 138  K 4.9 5.0 4.3  CL 98 100 103  CO2 28  --  22  GLUCOSE 244* 246* 163*  BUN 16 16 13   CREATININE 1.07 1.20 1.08  CALCIUM  8.8*  --  8.5*  MG  --   --  1.8  PHOS  --   --  2.6   GFR: Estimated Creatinine Clearance: 51.5 mL/min (by C-G formula based on SCr of 1.08 mg/dL). Recent Labs  Lab 06/01/24 2105 06/01/24 2131 06/02/24 0438  WBC 12.5*  --  11.5*  LATICACIDVEN  --  1.2 2.2*    Liver Function Tests: Recent Labs  Lab 06/02/24 0438  AST 20  ALT 15  ALKPHOS 47  BILITOT 2.1*  PROT 5.8*  ALBUMIN 3.2*   No results for input(s): LIPASE, AMYLASE in the last 168 hours. No results for input(s): AMMONIA in the last 168 hours.  ABG    Component Value Date/Time   PHART 7.25 (L) 06/01/2024 2130   PCO2ART 58 (H) 06/01/2024 2130   PO2ART 344 (H) 06/01/2024 2130   HCO3 26.1 06/02/2024 0436   TCO2 31 06/01/2024 2118   ACIDBASEDEF 2.8 (H) 06/01/2024 2130   O2SAT 84 06/02/2024 0436     Coagulation Profile: No results for input(s): INR, PROTIME in the last 168 hours.  Cardiac Enzymes: No results for input(s): CKTOTAL, CKMB, CKMBINDEX, TROPONINI in  the last 168 hours.  HbA1C: Hgb A1c MFr Bld  Date/Time Value Ref Range Status  06/02/2024 04:38 AM 4.5 (L) 4.8 - 5.6 % Final    Comment:    (NOTE) Diagnosis of Diabetes The following HbA1c ranges recommended by the American Diabetes Association (ADA) may be used as an aid in the diagnosis of diabetes mellitus.  Hemoglobin             Suggested A1C NGSP%              Diagnosis  <5.7                   Non Diabetic  5.7-6.4                Pre-Diabetic  >6.4  Diabetic  <7.0                   Glycemic control for                       adults with diabetes.    12/14/2021 08:32 AM 5.3 4.8 - 5.6 % Final    Comment:             Prediabetes: 5.7 - 6.4          Diabetes: >6.4          Glycemic control for adults with diabetes: <7.0     CBG: Recent Labs  Lab 06/01/24 2053 06/02/24 0108 06/02/24 0314  GLUCAP 240* 192* 156*    Review of Systems:   Intubated unable to perform  Past Medical History:  He,  has a past medical history of Emphysema of lung (HCC), Essential tremor, and Hypertension.   Surgical History:  History reviewed. No pertinent surgical history.   Social History:   reports that he quit smoking about 52 years ago. His smoking use included cigarettes. He has never used smokeless tobacco. He reports current alcohol use of about 2.0 standard drinks of alcohol per week. He reports that he does not use drugs.   Family History:  His family history includes Lung disease in his paternal grandfather.   Allergies No Known Allergies   Home Medications  Prior to Admission medications   Medication Sig Start Date End Date Taking? Authorizing Provider  albuterol  (VENTOLIN  HFA) 108 (90 Base) MCG/ACT inhaler USE 2 INHALATIONS BY MOUTH EVERY 6 HOURS AS NEEDED FOR WHEEZING  OR SHORTNESS OF BREATH 10/08/23   Tobie Suzzane POUR, MD  doxycycline  (VIBRA -TABS) 100 MG tablet Take 1 tablet (100 mg total) by mouth 2 (two) times daily. 05/14/24   Darlean Ozell NOVAK, MD   famotidine  (PEPCID ) 20 MG tablet One after supper 03/18/24   Darlean Ozell NOVAK, MD  ipratropium-albuterol  (DUONEB) 0.5-2.5 (3) MG/3ML SOLN Take 3 mLs by nebulization every 6 (six) hours as needed. 05/14/24   Darlean Ozell NOVAK, MD  Misc. Devices MISC Blood pressure cuff/device - 1. ICD10: I10 06/14/22   Tobie Suzzane POUR, MD  pantoprazole  (PROTONIX ) 40 MG tablet Take 1 tablet (40 mg total) by mouth daily. Take 30-60 min before first meal of the day 03/18/24   Darlean Ozell NOVAK, MD  predniSONE  (DELTASONE ) 10 MG tablet Take  4 each am x 2 days,   2 each am x 2 days,  1 each am x 2 days and stop 05/14/24   Darlean Ozell NOVAK, MD        CRITICAL CARE Performed by: Sammi JONETTA Fredericks.     Total critical care time: 40 minutes   Critical care time was exclusive of separately billable procedures and treating other patients.   Critical care was necessary to treat or prevent imminent or life-threatening deterioration.   Critical care was time spent personally by me on the following activities: development of treatment plan with patient and/or surrogate as well as nursing, discussions with consultants, evaluation of patient's response to treatment, examination of patient, obtaining history from patient or surrogate, ordering and performing treatments and interventions, ordering and review of laboratory studies, ordering and review of radiographic studies, pulse oximetry, re-evaluation of patient's condition and participation in multidisciplinary rounds.  Sammi JONETTA Fredericks, MD Pulmonary, Critical Care and Sleep Attending.  Pager: [phone]  06/02/2024, 7:47 AM

## 2024-06-02 NOTE — Plan of Care (Signed)
  Problem: Education: Goal: Knowledge of General Education information will improve Description: Including pain rating scale, medication(s)/side effects and non-pharmacologic comfort measures Outcome: Progressing   Problem: Clinical Measurements: Goal: Ability to maintain clinical measurements within normal limits will improve Outcome: Progressing Goal: Will remain free from infection Outcome: Progressing Goal: Diagnostic test results will improve Outcome: Progressing Goal: Respiratory complications will improve Outcome: Progressing Goal: Cardiovascular complication will be avoided Outcome: Progressing   Problem: Activity: Goal: Risk for activity intolerance will decrease Outcome: Progressing   Problem: Nutrition: Goal: Adequate nutrition will be maintained Outcome: Progressing   Problem: Elimination: Goal: Will not experience complications related to bowel motility Outcome: Progressing Goal: Will not experience complications related to urinary retention Outcome: Progressing   Problem: Pain Managment: Goal: General experience of comfort will improve and/or be controlled Outcome: Progressing   Problem: Skin Integrity: Goal: Risk for impaired skin integrity will decrease Outcome: Progressing   Problem: Safety: Goal: Non-violent Restraint(s) Outcome: Progressing   Problem: Fluid Volume: Goal: Ability to maintain a balanced intake and output will improve Outcome: Progressing   Problem: Metabolic: Goal: Ability to maintain appropriate glucose levels will improve Outcome: Progressing   Problem: Skin Integrity: Goal: Risk for impaired skin integrity will decrease Outcome: Progressing   Problem: Tissue Perfusion: Goal: Adequacy of tissue perfusion will improve Outcome: Progressing

## 2024-06-03 ENCOUNTER — Other Ambulatory Visit (HOSPITAL_COMMUNITY)

## 2024-06-03 ENCOUNTER — Inpatient Hospital Stay (HOSPITAL_COMMUNITY)

## 2024-06-03 DIAGNOSIS — Z4682 Encounter for fitting and adjustment of non-vascular catheter: Secondary | ICD-10-CM | POA: Diagnosis not present

## 2024-06-03 DIAGNOSIS — I959 Hypotension, unspecified: Secondary | ICD-10-CM

## 2024-06-03 DIAGNOSIS — R0902 Hypoxemia: Secondary | ICD-10-CM | POA: Diagnosis not present

## 2024-06-03 LAB — BASIC METABOLIC PANEL WITH GFR
Anion gap: 9 (ref 5–15)
Anion gap: 13 (ref 5–15)
BUN: 24 mg/dL — ABNORMAL HIGH (ref 8–23)
BUN: 32 mg/dL — ABNORMAL HIGH (ref 8–23)
CO2: 22 mmol/L (ref 22–32)
CO2: 24 mmol/L (ref 22–32)
Calcium: 8.6 mg/dL — ABNORMAL LOW (ref 8.9–10.3)
Calcium: 8.4 mg/dL — ABNORMAL LOW (ref 8.9–10.3)
Chloride: 103 mmol/L (ref 98–111)
Chloride: 100 mmol/L (ref 98–111)
Creatinine, Ser: 1.03 mg/dL (ref 0.61–1.24)
Creatinine, Ser: 1.27 mg/dL — ABNORMAL HIGH (ref 0.61–1.24)
GFR, Estimated: >60 mL/min (ref >60)
GFR, Estimated: 55 mL/min — ABNORMAL LOW (ref >60)
Glucose, Bld: 131 mg/dL — ABNORMAL HIGH (ref 70–99)
Glucose, Bld: 136 mg/dL — ABNORMAL HIGH (ref 70–99)
Potassium: 4.1 mmol/L (ref 3.5–5.1)
Potassium: 4 mmol/L (ref 3.5–5.1)
Sodium: 134 mmol/L — ABNORMAL LOW (ref 135–145)
Sodium: 137 mmol/L (ref 135–145)

## 2024-06-03 LAB — PHOSPHORUS: Phosphorus: 4.2 mg/dL (ref 2.5–4.6)

## 2024-06-03 LAB — GLUCOSE, CAPILLARY
Glucose-Capillary: 143 mg/dL — ABNORMAL HIGH (ref 70–99)
Glucose-Capillary: 148 mg/dL — ABNORMAL HIGH (ref 70–99)
Glucose-Capillary: 130 mg/dL — ABNORMAL HIGH (ref 70–99)
Glucose-Capillary: 142 mg/dL — ABNORMAL HIGH (ref 70–99)
Glucose-Capillary: 164 mg/dL — ABNORMAL HIGH (ref 70–99)

## 2024-06-03 LAB — CBC
HCT: 34.6 % — ABNORMAL LOW (ref 39.0–52.0)
Hemoglobin: 11.5 g/dL — ABNORMAL LOW (ref 13.0–17.0)
MCH: 31.6 pg (ref 26.0–34.0)
MCHC: 33.2 g/dL (ref 30.0–36.0)
MCV: 95.1 fL (ref 80.0–100.0)
Platelets: 251 K/uL (ref 150–400)
RBC: 3.64 MIL/uL — ABNORMAL LOW (ref 4.22–5.81)
RDW: 13.5 % (ref 11.5–15.5)
WBC: 12.2 K/uL — ABNORMAL HIGH (ref 4.0–10.5)
nRBC: 0 % (ref 0.0–0.2)

## 2024-06-03 LAB — MAGNESIUM
Magnesium: 2.4 mg/dL (ref 1.7–2.4)
Magnesium: 2.2 mg/dL (ref 1.7–2.4)

## 2024-06-03 MED ORDER — FUROSEMIDE 10 MG/ML IJ SOLN
40.0000 mg | Freq: Once | INTRAMUSCULAR | Status: AC
  Administered 2024-06-03: 40 mg via INTRAVENOUS
  Filled 2024-06-03: qty 4

## 2024-06-03 MED ORDER — FUROSEMIDE 10 MG/ML IJ SOLN
40.0000 mg | Freq: Once | INTRAMUSCULAR | Status: AC
Start: 2024-06-03 — End: 2024-06-03
  Administered 2024-06-03: 40 mg via INTRAVENOUS
  Filled 2024-06-03: qty 4

## 2024-06-03 MED ORDER — DEXMEDETOMIDINE HCL IN NACL 400 MCG/100ML IV SOLN
0.0000 ug/kg/h | INTRAVENOUS | Status: DC
  Administered 2024-06-03: 1 ug/kg/h via INTRAVENOUS
  Administered 2024-06-03: 0.5 ug/kg/h via INTRAVENOUS
  Administered 2024-06-03 (×2): 1.1 ug/kg/h via INTRAVENOUS
  Administered 2024-06-04: 1.2 ug/kg/h via INTRAVENOUS
  Administered 2024-06-04 (×2): 1.1 ug/kg/h via INTRAVENOUS
  Administered 2024-06-05 (×2): 1.2 ug/kg/h via INTRAVENOUS
  Filled 2024-06-03 (×10): qty 100

## 2024-06-03 MED ORDER — QUETIAPINE FUMARATE 50 MG PO TABS
50.0000 mg | ORAL_TABLET | Freq: Every evening | ORAL | Status: DC
  Administered 2024-06-03 – 2024-06-04 (×2): 50 mg
  Filled 2024-06-03 (×2): qty 1

## 2024-06-03 NOTE — Progress Notes (Signed)
 NAME:  Martin Ford, MRN:  [id number], DOB:  08/10/1936, LOS: 2 ADMISSION DATE:  06/01/2024 CHIEF COMPLAINT:  Dypnea.    History of Present Illness:  Pt is encephelopathic; therefore, this HPI is obtained from chart review. Martin Ford is a 88 y.o. male who has a PMH as below including but not limited to COPD Stage 3 by GOLD (PFT's Dec 2022 with FEV1 0.95 (38%), ratio 0.48) and chronically on 2L O2. He sees Dr. Darlean and just saw him as an outpatient on 05/14/24 and was prescribed a prednisone  taper and 10 day course of Doxycycline .   On 9/29, he had worsening dyspnea despite increase in O2 from baseline 2L to 4L. He was taken to [facility] ED where he required intubation. He was given 1 dose of Vancomycin and Cefepime empirically.   CTA chest demonstrated Bronchiectasis, no PE. CT head demonstrated no acute issues, generalized atrophy and sinus thickening.   He was later transferred to [facility] for further management.  PMH: has Centrilobular emphysema (HCC); Essential tremor; Essential hypertension; Vitamin D deficiency; Mixed hyperlipidemia; Hypothyroidism; Prediabetes; PVD (peripheral vascular disease); Stage 3 severe COPD by GOLD classification (HCC); DOE (dyspnea on exertion); Chronic respiratory failure with hypoxia (HCC); Ascending aortic aneurysm; Refused influenza vaccine; Pulmonary nodule; COPD with acute exacerbation (HCC); COPD (chronic obstructive pulmonary disease) (HCC); and Acute and chronic respiratory failure (acute-on-chronic) (HCC) on their problem list.   Interim History / Subjective:  Sedated and intubated.  On trials when coming off sedation patient is getting agitated.  Unable to extubate secondary to that.  Significant Hospital Events: 9/29: admit and intubated.   Objective    Blood pressure 119/62, pulse 71, temperature 99.9 F (37.7 C), resp. rate 15, height 5' 11 (1.803 m), weight 71 kg, SpO2 100%.    Vent Mode: PRVC FiO2 (%):  [40 %-50 %] 40 % Set Rate:  [20 bmp] 20  bmp Vt Set:  [600 mL] 600 mL PEEP:  [5 cmH20] 5 cmH20 Plateau Pressure:  [15 cmH20-22 cmH20] 15 cmH20   Intake/Output Summary (Last 24 hours) at 06/03/2024 1449 Last data filed at 06/03/2024 1000 Gross per 24 hour  Intake 1638.13 ml  Output 150 ml  Net 1488.13 ml   Filed Weights   06/01/24 2056 06/02/24 0412 06/03/24 0431  Weight: 68 kg 74.2 kg 71 kg    Examination: General: Elderly male in no apparent distress was intubated.  Diminished breath sounds while Lungs: Diminished breath sounds bilaterally. Heart: regular rate rhythm, no murmur appreciated.  Abdomen: non tender, non distended. Normal BS.  Neuro: Sedated and intubated. Bilateral pitting edema.  Assessment and Plan  Acute on chronic hypoxic/hypercapnic respiratory failure 2/2 AECOPD - COPD Stage 3 by GOLD (PFT's Dec 2022 with FEV1 0.95 (38%), ratio 0.48) and chronically on 2L O2. S/p intubation in ED 9/29 and given 1 dose of Vanc/Cefepime empirically. - LT VV. - Transition to Precedex and fentanyl only for sedation.  Agitation off sedation.  Added Seroquel nightly.  If continued agitation will consider transitioning fentanyl to ketamine to facilitate extubation. - repeat CXR.  - lasix x2. ECHO.  - Budesonide nebs and as needed and scheduled DuoNebs. -RPP pending - Solu-Medrol 40 mg IV twice daily.  Continue. - Azithromycin  for COPD exacerbation.  Chest x-ray negative. - Albuterol  PRN.   Hypotension - 2/2 sedation. - Low dose NE as needed for goal MAP . 65. - Wean NE as able. - Limit sedation as able.   Hyperglycemia - presumed 2/2 steroids. - SSI.  Moderate protein calorie malnutrition: - Tube feed.  Updated wife who is at the bedside.  Labs   CBC: Recent Labs  Lab 06/01/24 2105 06/01/24 2118 06/02/24 0438 06/02/24 0926 06/03/24 0359  WBC 12.5*  --  11.5* 8.0 12.2*  NEUTROABS 7.6  --   --   --   --   HGB 13.6 14.3 11.7* 11.7* 11.5*  HCT 43.2 42.0 35.0* 35.4* 34.6*  MCV 100.2*  --  94.6 93.7 95.1   PLT 374  --  271 303 251    Basic Metabolic Panel: Recent Labs  Lab 06/01/24 2105 06/01/24 2118 06/02/24 0438 06/02/24 0926 06/03/24 0359  NA 136 138 138 136 134*  K 4.9 5.0 4.3 4.4 4.1  CL 98 100 103 102 103  CO2 28  --  22 23 22   GLUCOSE 244* 246* 163* 136* 131*  BUN 16 16 13 15  24*  CREATININE 1.07 1.20 1.08 0.79 1.03  CALCIUM  8.8*  --  8.5* 8.7* 8.6*  MG  --   --  1.8 2.0 2.4  PHOS  --   --  2.6 3.1 4.2   GFR: Estimated Creatinine Clearance: 51.7 mL/min (by C-G formula based on SCr of 1.03 mg/dL). Recent Labs  Lab 06/01/24 2105 06/01/24 2131 06/02/24 0438 06/02/24 0926 06/03/24 0359  WBC 12.5*  --  11.5* 8.0 12.2*  LATICACIDVEN  --  1.2 2.2* 1.7  --     Liver Function Tests: Recent Labs  Lab 06/02/24 0438  AST 20  ALT 15  ALKPHOS 47  BILITOT 2.1*  PROT 5.8*  ALBUMIN 3.2*   No results for input(s): LIPASE, AMYLASE in the last 168 hours. No results for input(s): AMMONIA in the last 168 hours.  ABG    Component Value Date/Time   PHART 7.25 (L) 06/01/2024 2130   PCO2ART 58 (H) 06/01/2024 2130   PO2ART 344 (H) 06/01/2024 2130   HCO3 26.1 06/02/2024 0436   TCO2 31 06/01/2024 2118   ACIDBASEDEF 2.8 (H) 06/01/2024 2130   O2SAT 84 06/02/2024 0436      Past Medical History:  He,  has a past medical history of Emphysema of lung (HCC), Essential tremor, and Hypertension.   Surgical History:  History reviewed. No pertinent surgical history.   Social History:   reports that he quit smoking about 52 years ago. His smoking use included cigarettes. He has never used smokeless tobacco. He reports current alcohol use of about 2.0 standard drinks of alcohol per week. He reports that he does not use drugs.   Family History:  His family history includes Lung disease in his paternal grandfather.   Allergies No Known Allergies   Home Medications  Prior to Admission medications   Medication Sig Start Date End Date Taking? Authorizing Provider   albuterol  (VENTOLIN  HFA) 108 (90 Base) MCG/ACT inhaler USE 2 INHALATIONS BY MOUTH EVERY 6 HOURS AS NEEDED FOR WHEEZING  OR SHORTNESS OF BREATH 10/08/23  Yes Patel, Suzzane POUR, MD  Aspirin-Caffeine (BAYER BACK & BODY) 500-32.5 MG TABS Take 1 tablet by mouth 2 (two) times daily as needed (Pain).   Yes [provider]  ipratropium-albuterol  (DUONEB) 0.5-2.5 (3) MG/3ML SOLN Take 3 mLs by nebulization every 6 (six) hours as needed. 05/14/24  Yes Darlean Ozell NOVAK, MD  pantoprazole  (PROTONIX ) 40 MG tablet Take 1 tablet (40 mg total) by mouth daily. Take 30-60 min before first meal of the day Patient taking differently: Take 40 mg by mouth daily as needed (Indigestion). 03/18/24  Yes Darlean Ozell NOVAK, MD  doxycycline  (VIBRA -TABS) 100 MG tablet Take 1 tablet (100 mg total) by mouth 2 (two) times daily. Patient not taking: Reported on 06/02/2024 05/14/24   Darlean Ozell NOVAK, MD  predniSONE  (DELTASONE ) 10 MG tablet Take  4 each am x 2 days,   2 each am x 2 days,  1 each am x 2 days and stop Patient not taking: Reported on 06/02/2024 05/14/24   Darlean Ozell NOVAK, MD     CRITICAL CARE Performed by: Sammi JONETTA Fredericks.     Total critical care time: 45 minutes   Critical care time was exclusive of separately billable procedures and treating other patients.   Critical care was necessary to treat or prevent imminent or life-threatening deterioration.   Critical care was time spent personally by me on the following activities: development of treatment plan with patient and/or surrogate as well as nursing, discussions with consultants, evaluation of patient's response to treatment, examination of patient, obtaining history from patient or surrogate, ordering and performing treatments and interventions, ordering and review of laboratory studies, ordering and review of radiographic studies, pulse oximetry, re-evaluation of patient's condition and participation in multidisciplinary rounds.  Sammi JONETTA Fredericks, MD Pulmonary,  Critical Care and Sleep Attending.  Pager: [phone]  06/03/2024, 2:49 PM

## 2024-06-03 NOTE — Plan of Care (Signed)

## 2024-06-03 NOTE — Plan of Care (Signed)
  Problem: Nutrition: Goal: Adequate nutrition will be maintained Outcome: Progressing   Problem: Education: Goal: Knowledge of General Education information will improve Description: Including pain rating scale, medication(s)/side effects and non-pharmacologic comfort measures Outcome: Not Progressing   Problem: Health Behavior/Discharge Planning: Goal: Ability to manage health-related needs will improve Outcome: Not Progressing   Problem: Clinical Measurements: Goal: Ability to maintain clinical measurements within normal limits will improve Outcome: Not Progressing Goal: Will remain free from infection Outcome: Not Progressing Goal: Diagnostic test results will improve Outcome: Not Progressing Goal: Respiratory complications will improve Outcome: Not Progressing Goal: Cardiovascular complication will be avoided Outcome: Not Progressing   Problem: Activity: Goal: Risk for activity intolerance will decrease Outcome: Not Progressing   Problem: Coping: Goal: Level of anxiety will decrease Outcome: Not Progressing   Problem: Elimination: Goal: Will not experience complications related to bowel motility Outcome: Not Progressing Goal: Will not experience complications related to urinary retention Outcome: Not Progressing   Problem: Pain Managment: Goal: General experience of comfort will improve and/or be controlled Outcome: Not Progressing   Problem: Safety: Goal: Ability to remain free from injury will improve Outcome: Not Progressing   Problem: Skin Integrity: Goal: Risk for impaired skin integrity will decrease Outcome: Not Progressing   Problem: Safety: Goal: Non-violent Restraint(s) Outcome: Not Progressing   Problem: Education: Goal: Ability to describe self-care measures that may prevent or decrease complications (Diabetes Survival Skills Education) will improve Outcome: Not Progressing Goal: Individualized Educational Video(s) Outcome: Not Progressing    Problem: Coping: Goal: Ability to adjust to condition or change in health will improve Outcome: Not Progressing   Problem: Fluid Volume: Goal: Ability to maintain a balanced intake and output will improve Outcome: Not Progressing   Problem: Health Behavior/Discharge Planning: Goal: Ability to identify and utilize available resources and services will improve Outcome: Not Progressing Goal: Ability to manage health-related needs will improve Outcome: Not Progressing   Problem: Metabolic: Goal: Ability to maintain appropriate glucose levels will improve Outcome: Not Progressing   Problem: Nutritional: Goal: Maintenance of adequate nutrition will improve Outcome: Not Progressing Goal: Progress toward achieving an optimal weight will improve Outcome: Not Progressing   Problem: Skin Integrity: Goal: Risk for impaired skin integrity will decrease Outcome: Not Progressing   Problem: Tissue Perfusion: Goal: Adequacy of tissue perfusion will improve Outcome: Not Progressing

## 2024-06-04 ENCOUNTER — Inpatient Hospital Stay (HOSPITAL_COMMUNITY)

## 2024-06-04 DIAGNOSIS — R2242 Localized swelling, mass and lump, left lower limb: Secondary | ICD-10-CM

## 2024-06-04 DIAGNOSIS — R0603 Acute respiratory distress: Secondary | ICD-10-CM

## 2024-06-04 DIAGNOSIS — N179 Acute kidney failure, unspecified: Secondary | ICD-10-CM

## 2024-06-04 DIAGNOSIS — I341 Nonrheumatic mitral (valve) prolapse: Secondary | ICD-10-CM

## 2024-06-04 LAB — CBC
HCT: 34.1 % — ABNORMAL LOW (ref 39.0–52.0)
Hemoglobin: 10.9 g/dL — ABNORMAL LOW (ref 13.0–17.0)
MCH: 27.9 pg (ref 26.0–34.0)
MCHC: 32 g/dL (ref 30.0–36.0)
MCV: 87.4 fL (ref 80.0–100.0)
Platelets: 168 K/uL (ref 150–400)
RBC: 3.9 MIL/uL — ABNORMAL LOW (ref 4.22–5.81)
RDW: 16.7 % — ABNORMAL HIGH (ref 11.5–15.5)
WBC: 14 K/uL — ABNORMAL HIGH (ref 4.0–10.5)
nRBC: 0 % (ref 0.0–0.2)

## 2024-06-04 LAB — BASIC METABOLIC PANEL WITH GFR
Anion gap: 16 — ABNORMAL HIGH (ref 5–15)
BUN: 94 mg/dL — ABNORMAL HIGH (ref 8–23)
CO2: 21 mmol/L — ABNORMAL LOW (ref 22–32)
Calcium: 8.3 mg/dL — ABNORMAL LOW (ref 8.9–10.3)
Chloride: 100 mmol/L (ref 98–111)
Creatinine, Ser: 2.24 mg/dL — ABNORMAL HIGH (ref 0.61–1.24)
GFR, Estimated: 28 mL/min — ABNORMAL LOW (ref >60)
Glucose, Bld: 120 mg/dL — ABNORMAL HIGH (ref 70–99)
Potassium: 4.8 mmol/L (ref 3.5–5.1)
Sodium: 137 mmol/L (ref 135–145)

## 2024-06-04 LAB — ECHOCARDIOGRAM COMPLETE
AR max vel: 1.06 cm2
AV Area VTI: 1.31 cm2
AV Area mean vel: 1.01 cm2
AV Mean grad: 21 mmHg
AV Peak grad: 36.4 mmHg
Ao pk vel: 3.02 m/s
Area-P 1/2: 4.15 cm2
Calc EF: 58 %
Height: 71 in
MV VTI: 1.96 cm2
S' Lateral: 3.08 cm
Single Plane A2C EF: 55.1 %
Single Plane A4C EF: 60.3 %
Weight: 2476.21 [oz_av]

## 2024-06-04 LAB — MAGNESIUM: Magnesium: 2.6 mg/dL — ABNORMAL HIGH (ref 1.7–2.4)

## 2024-06-04 LAB — PHOSPHORUS: Phosphorus: 7.5 mg/dL — ABNORMAL HIGH (ref 2.5–4.6)

## 2024-06-04 LAB — GLUCOSE, CAPILLARY
Glucose-Capillary: 113 mg/dL — ABNORMAL HIGH (ref 70–99)
Glucose-Capillary: 137 mg/dL — ABNORMAL HIGH (ref 70–99)
Glucose-Capillary: 159 mg/dL — ABNORMAL HIGH (ref 70–99)
Glucose-Capillary: 186 mg/dL — ABNORMAL HIGH (ref 70–99)
Glucose-Capillary: 204 mg/dL — ABNORMAL HIGH (ref 70–99)
Glucose-Capillary: 96 mg/dL (ref 70–99)

## 2024-06-04 MED ORDER — KETAMINE HCL-SODIUM CHLORIDE 1000-0.69 MG/100ML-% IV SOLN
0.3000 mg/kg/h | INTRAVENOUS | Status: DC
  Administered 2024-06-04 (×2): 1 mg/kg/h via INTRAVENOUS
  Administered 2024-06-05: 0.5 mg/kg/h via INTRAVENOUS
  Filled 2024-06-04 (×3): qty 100

## 2024-06-04 MED ORDER — FENTANYL BOLUS VIA INFUSION
100.0000 ug | INTRAVENOUS | Status: DC | PRN
  Administered 2024-06-04 (×2): 100 ug via INTRAVENOUS

## 2024-06-04 MED ORDER — ACETAMINOPHEN 325 MG PO TABS
650.0000 mg | ORAL_TABLET | Freq: Four times a day (QID) | ORAL | Status: DC | PRN
  Administered 2024-06-05 – 2024-06-08 (×2): 650 mg
  Filled 2024-06-04 (×3): qty 2

## 2024-06-04 MED ORDER — POLYETHYLENE GLYCOL 3350 17 G PO PACK
17.0000 g | PACK | Freq: Two times a day (BID) | ORAL | Status: DC
  Administered 2024-06-04 – 2024-06-05 (×2): 17 g
  Filled 2024-06-04 (×2): qty 1

## 2024-06-04 MED ORDER — SENNA 8.6 MG PO TABS
1.0000 | ORAL_TABLET | Freq: Two times a day (BID) | ORAL | Status: DC
  Administered 2024-06-04 – 2024-06-05 (×3): 8.6 mg
  Filled 2024-06-04 (×3): qty 1

## 2024-06-04 MED ORDER — MIDAZOLAM HCL 2 MG/2ML IJ SOLN
2.0000 mg | Freq: Once | INTRAMUSCULAR | Status: AC
  Administered 2024-06-04: 2 mg via INTRAVENOUS
  Filled 2024-06-04: qty 2

## 2024-06-04 MED ORDER — FENTANYL CITRATE PF 50 MCG/ML IJ SOSY
50.0000 ug | PREFILLED_SYRINGE | INTRAMUSCULAR | Status: DC | PRN
  Administered 2024-06-04: 50 ug via INTRAVENOUS
  Filled 2024-06-04: qty 2

## 2024-06-04 NOTE — Plan of Care (Signed)
  Problem: Elimination: Goal: Will not experience complications related to urinary retention Outcome: Progressing   Problem: Education: Goal: Knowledge of General Education information will improve Description: Including pain rating scale, medication(s)/side effects and non-pharmacologic comfort measures Outcome: Not Progressing   Problem: Health Behavior/Discharge Planning: Goal: Ability to manage health-related needs will improve Outcome: Not Progressing   Problem: Clinical Measurements: Goal: Ability to maintain clinical measurements within normal limits will improve Outcome: Not Progressing Goal: Will remain free from infection Outcome: Not Progressing Goal: Diagnostic test results will improve Outcome: Not Progressing Goal: Respiratory complications will improve Outcome: Not Progressing Goal: Cardiovascular complication will be avoided Outcome: Not Progressing   Problem: Activity: Goal: Risk for activity intolerance will decrease Outcome: Not Progressing   Problem: Nutrition: Goal: Adequate nutrition will be maintained Outcome: Not Progressing   Problem: Coping: Goal: Level of anxiety will decrease Outcome: Not Progressing   Problem: Elimination: Goal: Will not experience complications related to bowel motility Outcome: Not Progressing   Problem: Pain Managment: Goal: General experience of comfort will improve and/or be controlled Outcome: Not Progressing   Problem: Safety: Goal: Ability to remain free from injury will improve Outcome: Not Progressing   Problem: Skin Integrity: Goal: Risk for impaired skin integrity will decrease Outcome: Not Progressing   Problem: Safety: Goal: Non-violent Restraint(s) Outcome: Not Progressing   Problem: Education: Goal: Ability to describe self-care measures that may prevent or decrease complications (Diabetes Survival Skills Education) will improve Outcome: Not Progressing Goal: Individualized Educational  Video(s) Outcome: Not Progressing   Problem: Coping: Goal: Ability to adjust to condition or change in health will improve Outcome: Not Progressing   Problem: Fluid Volume: Goal: Ability to maintain a balanced intake and output will improve Outcome: Not Progressing   Problem: Health Behavior/Discharge Planning: Goal: Ability to identify and utilize available resources and services will improve Outcome: Not Progressing Goal: Ability to manage health-related needs will improve Outcome: Not Progressing   Problem: Metabolic: Goal: Ability to maintain appropriate glucose levels will improve Outcome: Not Progressing   Problem: Nutritional: Goal: Maintenance of adequate nutrition will improve Outcome: Not Progressing Goal: Progress toward achieving an optimal weight will improve Outcome: Not Progressing   Problem: Skin Integrity: Goal: Risk for impaired skin integrity will decrease Outcome: Not Progressing   Problem: Tissue Perfusion: Goal: Adequacy of tissue perfusion will improve Outcome: Not Progressing

## 2024-06-04 NOTE — Progress Notes (Signed)
 eLink Physician-Brief Progress Note Patient Name: Martin Ford DOB: [birth year] MRN: [id number]   Date of Service  06/04/2024  HPI/Events of Note  87 yr old intubated male extremely restless and agitated with any stimulation despite high dose precedex and fentanyl drips.  Receiving fentanyl and haldol pushes also.  eICU Interventions  Versed IV x 1 now      Intervention Category Major Interventions: Delirium, psychosis, severe agitation - evaluation and management  CLAUDENE AGENT, P 06/04/2024, 1:13 AM

## 2024-06-04 NOTE — TOC Initial Note (Addendum)
 Transition of Care [facility]) - Initial/Assessment Note    Patient Details  Name: Martin Ford MRN: [id number] Date of Birth: Oct 13, 1935  [facility] CM/SW Contact:    Martin Ford Saa, LCSWA Phone Number: 06/04/2024, 12:11 PM  Clinical Narrative:                  12:11 PM CSW introduced self and role to patient's spouse, Martin Ford (per chart review, patient is currently intubated with NG/OG that was placed approximately two days ago). Martin Ford confirmed that she resides with patient and could provide transportation for discharge and appointments. Martin Ford confirmed that patient drove self to appointments prior to current admission. Martin Ford confirmed patient does not have SNF or HH history. Martin Ford confirmed patient has DME (oxygen  tanks, oxygen  concentrators) through [employer]. Martin Ford informed CSW that patient has cane and RW that were privately paid for. TOC will continue to follow.  Expected Discharge Plan: Home/Self Care Barriers to Discharge: Continued Medical Work up   Patient Goals and CMS Choice            Expected Discharge Plan and Services       Living arrangements for the past 2 months: Single Family Home                                      Prior Living Arrangements/Services Living arrangements for the past 2 months: Single Family Home Lives with:: Spouse Patient language and need for interpreter reviewed:: Yes        Need for Family Participation in Patient Care: Yes (Comment)   Current home services: DME Criminal Activity/Legal Involvement Pertinent to Current Situation/Hospitalization: No - Comment as needed  Activities of Daily Living      Permission Sought/Granted Permission sought to share information with : Family Supports Permission granted to share information with : No (Contact information on chart)  Share Information with NAME: Martin Ford     Permission granted to share info w Relationship: Spouse  Permission granted to share info w  Contact Information: [phone]  Emotional Assessment   Attitude/Demeanor/Rapport: Unable to Assess, Intubated (Following Commands or Not Following Commands) Affect (typically observed): Unable to Assess   Alcohol / Substance Use: Not Applicable Psych Involvement: No (comment)  Admission diagnosis:  COPD (chronic obstructive pulmonary disease) (HCC) [J44.9] Acute respiratory failure with hypoxia (HCC) [J96.01] Acute respiratory failure with hypoxia and hypercapnia (HCC) [J96.01, J96.02] Glasgow coma scale total score 3-8, in the field (EMT or ambulance) [facility]) [R40.2431] Acute and chronic respiratory failure (acute-on-chronic) (HCC) [J96.20] Patient Active Problem List   Diagnosis Date Noted   Acute and chronic respiratory failure (acute-on-chronic) (HCC) 06/02/2024   Malnutrition of moderate degree 06/02/2024   COPD (chronic obstructive pulmonary disease) (HCC) 06/01/2024   COPD with acute exacerbation (HCC) 02/23/2024   Refused influenza vaccine 06/14/2022   Pulmonary nodule 06/14/2022   Ascending aortic aneurysm 04/18/2022   DOE (dyspnea on exertion) 12/20/2021   Chronic respiratory failure with hypoxia (HCC) 12/20/2021   Stage 3 severe COPD by GOLD classification (HCC) 09/06/2021   Mixed hyperlipidemia 08/21/2021   Hypothyroidism 08/21/2021   Prediabetes 08/21/2021   PVD (peripheral vascular disease) 08/21/2021   Centrilobular emphysema (HCC) 05/23/2021   Essential tremor 05/23/2021   Essential hypertension 05/23/2021   Vitamin D deficiency 05/23/2021   PCP:  Martin Suzzane POUR, MD Pharmacy:   [facility] [address] [place] - [address] [place]  [address] Phone: [phone] Fax: [phone]  [facility] - [place], [place] - [address] [place] [address] Phone: [phone] Fax: [phone]     Social Drivers of Health (SDOH) Social History: SDOH Screenings   Food  Insecurity: No Food Insecurity (06/01/2022)  Housing: Unknown (06/03/2024)  Transportation Needs: Unknown (06/03/2024)  Utilities: Patient Unable To Answer (06/03/2024)  Alcohol Screen: Low Risk  (06/01/2022)  Depression (PHQ2-9): Low Risk  (02/21/2024)  Financial Resource Strain: Low Risk  (06/01/2022)  Physical Activity: Inactive (06/01/2022)  Social Connections: Unknown (06/01/2022)  Stress: No Stress Concern Present (06/01/2022)  Tobacco Use: Medium Risk (06/02/2024)   SDOH Interventions:     Readmission Risk Interventions     No data to display

## 2024-06-04 NOTE — Progress Notes (Signed)
 NAME:  Martin Ford, MRN:  [id number], DOB:  08/25/1936, LOS: 3 ADMISSION DATE:  06/01/2024 CHIEF COMPLAINT:  Dypnea.    History of Present Illness:  Pt is encephelopathic; therefore, this HPI is obtained from chart review. Martin Ford is a 88 y.o. male who has a PMH as below including but not limited to COPD Stage 3 by GOLD (PFT's Dec 2022 with FEV1 0.95 (38%), ratio 0.48) and chronically on 2L O2. He sees Dr. Darlean and just saw him as an outpatient on 05/14/24 and was prescribed a prednisone  taper and 10 day course of Doxycycline .   On 9/29, he had worsening dyspnea despite increase in O2 from baseline 2L to 4L. He was taken to [facility] ED where he required intubation. He was given 1 dose of Vancomycin and Cefepime empirically.   CTA chest demonstrated Bronchiectasis, no PE. CT head demonstrated no acute issues, generalized atrophy and sinus thickening.   He was later transferred to [facility] for further management.  PMH: has Centrilobular emphysema (HCC); Essential tremor; Essential hypertension; Vitamin D deficiency; Mixed hyperlipidemia; Hypothyroidism; Prediabetes; PVD (peripheral vascular disease); Stage 3 severe COPD by GOLD classification (HCC); DOE (dyspnea on exertion); Chronic respiratory failure with hypoxia (HCC); Ascending aortic aneurysm; Refused influenza vaccine; Pulmonary nodule; COPD with acute exacerbation (HCC); COPD (chronic obstructive pulmonary disease) (HCC); and Acute and chronic respiratory failure (acute-on-chronic) (HCC) on their problem list.   Interim History / Subjective:  Patient is getting agitated when coming off sedation. Worsening renal function post diuresis yesterday. Fentanyl being changed to ketamine. No bowel movement yet.  Significant Hospital Events: 9/29: admit and intubated.   Objective    Blood pressure 103/72, pulse 62, temperature 99.9 F (37.7 C), resp. rate 17, height 5' 11 (1.803 m), weight 70.2 kg, SpO2 100%.    Vent Mode: PRVC FiO2 (%):   [40 %] 40 % Set Rate:  [16 bmp-20 bmp] 16 bmp Vt Set:  [490 mL] 490 mL PEEP:  [5 cmH20] 5 cmH20 Plateau Pressure:  [13 cmH20-17 cmH20] 16 cmH20   Intake/Output Summary (Last 24 hours) at 06/04/2024 1447 Last data filed at 06/04/2024 1400 Gross per 24 hour  Intake 1985.93 ml  Output 1550 ml  Net 435.93 ml   Filed Weights   06/02/24 0412 06/03/24 0431 06/04/24 0331  Weight: 74.2 kg 71 kg 70.2 kg    Examination: General: Elderly male in no apparent distress was intubated.  Diminished breath sounds while Lungs: Diminished breath sounds bilaterally. Heart: regular rate rhythm, no murmur appreciated.  Abdomen: non tender, non distended. Normal BS.  Neuro: Sedated and intubated. Bilateral pitting edema.  Assessment and Plan  Acute on chronic hypoxic/hypercapnic respiratory failure 2/2 AECOPD - COPD Stage 3 by GOLD (PFT's Dec 2022 with FEV1 0.95 (38%), ratio 0.48) and chronically on 2L O2. S/p intubation in ED 9/29 and given 1 dose of Vanc/Cefepime empirically. - LT VV. - Precedex changed to ketamine.  Precedex for sedation.  Added Seroquel nightly.  Getting agitated immediately when the sedation comes off.  Potential for breath stacking with significant tachypnea while extubated and hence will have to extubate once he is not agitated.  Chest x-ray without any - Completed. - worsening creatinine with diuresis.  Hold further diuresis.  Echo showing grade 1 diastolic dysfunction. - Budesonide nebs and as needed and scheduled DuoNebs. -RPP pending - Solu-Medrol 40 mg IV twice daily.  Continue. - Azithromycin  for COPD exacerbation.  Chest x-ray negative. - Albuterol  PRN.  AKI: - Creatinine went to 2.24.  Post diuresis. - Will get POCUS and if needed give some maintenance fluid.  Mitral valve prolapse: -Based on the echo.  Mild MR. -Cardiology input regarding fixture when patient is extubated.  Multi valvular abnormalities: -Mitral valve prolapse as above.  In addition echo showing  moderate AAS, mild to moderate AR, moderate TR. -Will get cardiology input as above.   Hypotension - 2/2 sedation-resolved. - Not epi discontinued.   Hyperglycemia - presumed 2/2 steroids. - SSI.   Moderate protein calorie malnutrition: - Tube feed.  Leg swelling: - Right greater than left.  Getting lower extremity Doppler.  Updated wife who is at the bedside on 10/1.  Labs   CBC: Recent Labs  Lab 06/01/24 2105 06/01/24 2118 06/02/24 0438 06/02/24 0926 06/03/24 0359 06/04/24 0400  WBC 12.5*  --  11.5* 8.0 12.2* 14.0*  NEUTROABS 7.6  --   --   --   --   --   HGB 13.6 14.3 11.7* 11.7* 11.5* 10.9*  HCT 43.2 42.0 35.0* 35.4* 34.6* 34.1*  MCV 100.2*  --  94.6 93.7 95.1 87.4  PLT 374  --  271 303 251 168    Basic Metabolic Panel: Recent Labs  Lab 06/02/24 0438 06/02/24 0926 06/03/24 0359 06/03/24 1751 06/04/24 0400  NA 138 136 134* 137 137  K 4.3 4.4 4.1 4.0 4.8  CL 103 102 103 100 100  CO2 22 23 22 24  21*  GLUCOSE 163* 136* 131* 136* 120*  BUN 13 15 24* 32* 94*  CREATININE 1.08 0.79 1.03 1.27* 2.24*  CALCIUM  8.5* 8.7* 8.6* 8.4* 8.3*  MG 1.8 2.0 2.4 2.2 2.6*  PHOS 2.6 3.1 4.2  --  7.5*   GFR: Estimated Creatinine Clearance: 23.5 mL/min (A) (by C-G formula based on SCr of 2.24 mg/dL (H)). Recent Labs  Lab 06/01/24 2131 06/02/24 0438 06/02/24 0926 06/03/24 0359 06/04/24 0400  WBC  --  11.5* 8.0 12.2* 14.0*  LATICACIDVEN 1.2 2.2* 1.7  --   --     Liver Function Tests: Recent Labs  Lab 06/02/24 0438  AST 20  ALT 15  ALKPHOS 47  BILITOT 2.1*  PROT 5.8*  ALBUMIN 3.2*   No results for input(s): LIPASE, AMYLASE in the last 168 hours. No results for input(s): AMMONIA in the last 168 hours.  ABG    Component Value Date/Time   PHART 7.25 (L) 06/01/2024 2130   PCO2ART 58 (H) 06/01/2024 2130   PO2ART 344 (H) 06/01/2024 2130   HCO3 26.1 06/02/2024 0436   TCO2 31 06/01/2024 2118   ACIDBASEDEF 2.8 (H) 06/01/2024 2130   O2SAT 84 06/02/2024 0436       Past Medical History:  He,  has a past medical history of Emphysema of lung (HCC), Essential tremor, and Hypertension.   Surgical History:  History reviewed. No pertinent surgical history.   Social History:   reports that he quit smoking about 52 years ago. His smoking use included cigarettes. He has never used smokeless tobacco. He reports current alcohol use of about 2.0 standard drinks of alcohol per week. He reports that he does not use drugs.   Family History:  His family history includes Lung disease in his paternal grandfather.   Allergies No Known Allergies   Home Medications  Prior to Admission medications   Medication Sig Start Date End Date Taking? Authorizing Provider  albuterol  (VENTOLIN  HFA) 108 (90 Base) MCG/ACT inhaler USE 2 INHALATIONS BY MOUTH EVERY 6 HOURS AS NEEDED FOR WHEEZING  OR SHORTNESS OF  BREATH 10/08/23  Yes Patel, Suzzane POUR, MD  Aspirin-Caffeine (BAYER BACK & BODY) 500-32.5 MG TABS Take 1 tablet by mouth 2 (two) times daily as needed (Pain).   Yes [provider]  ipratropium-albuterol  (DUONEB) 0.5-2.5 (3) MG/3ML SOLN Take 3 mLs by nebulization every 6 (six) hours as needed. 05/14/24  Yes Darlean Ozell NOVAK, MD  pantoprazole  (PROTONIX ) 40 MG tablet Take 1 tablet (40 mg total) by mouth daily. Take 30-60 min before first meal of the day Patient taking differently: Take 40 mg by mouth daily as needed (Indigestion). 03/18/24  Yes Darlean Ozell NOVAK, MD  doxycycline  (VIBRA -TABS) 100 MG tablet Take 1 tablet (100 mg total) by mouth 2 (two) times daily. Patient not taking: Reported on 06/02/2024 05/14/24   Darlean Ozell NOVAK, MD  predniSONE  (DELTASONE ) 10 MG tablet Take  4 each am x 2 days,   2 each am x 2 days,  1 each am x 2 days and stop Patient not taking: Reported on 06/02/2024 05/14/24   Darlean Ozell NOVAK, MD     CRITICAL CARE Performed by: Sammi JONETTA Fredericks.     Total critical care time: 46 minutes   Critical care time was exclusive of separately billable  procedures and treating other patients.   Critical care was necessary to treat or prevent imminent or life-threatening deterioration.   Critical care was time spent personally by me on the following activities: development of treatment plan with patient and/or surrogate as well as nursing, discussions with consultants, evaluation of patient's response to treatment, examination of patient, obtaining history from patient or surrogate, ordering and performing treatments and interventions, ordering and review of laboratory studies, ordering and review of radiographic studies, pulse oximetry, re-evaluation of patient's condition and participation in multidisciplinary rounds.  Sammi JONETTA Fredericks, MD Pulmonary, Critical Care and Sleep Attending.  Pager: [phone]  06/04/2024, 2:47 PM

## 2024-06-05 ENCOUNTER — Inpatient Hospital Stay (HOSPITAL_COMMUNITY)

## 2024-06-05 DIAGNOSIS — M7989 Other specified soft tissue disorders: Secondary | ICD-10-CM | POA: Diagnosis not present

## 2024-06-05 LAB — GLUCOSE, CAPILLARY
Glucose-Capillary: 119 mg/dL — ABNORMAL HIGH (ref 70–99)
Glucose-Capillary: 127 mg/dL — ABNORMAL HIGH (ref 70–99)
Glucose-Capillary: 129 mg/dL — ABNORMAL HIGH (ref 70–99)
Glucose-Capillary: 135 mg/dL — ABNORMAL HIGH (ref 70–99)
Glucose-Capillary: 199 mg/dL — ABNORMAL HIGH (ref 70–99)
Glucose-Capillary: 248 mg/dL — ABNORMAL HIGH (ref 70–99)
Glucose-Capillary: 276 mg/dL — ABNORMAL HIGH (ref 70–99)
Glucose-Capillary: 60 mg/dL — ABNORMAL LOW (ref 70–99)
Glucose-Capillary: 61 mg/dL — ABNORMAL LOW (ref 70–99)

## 2024-06-05 LAB — BASIC METABOLIC PANEL WITH GFR
Anion gap: 11 (ref 5–15)
BUN: 49 mg/dL — ABNORMAL HIGH (ref 8–23)
CO2: 28 mmol/L (ref 22–32)
Calcium: 8.5 mg/dL — ABNORMAL LOW (ref 8.9–10.3)
Chloride: 102 mmol/L (ref 98–111)
Creatinine, Ser: 1.01 mg/dL (ref 0.61–1.24)
GFR, Estimated: >60 mL/min (ref >60)
Glucose, Bld: 138 mg/dL — ABNORMAL HIGH (ref 70–99)
Potassium: 4.5 mmol/L (ref 3.5–5.1)
Sodium: 141 mmol/L (ref 135–145)

## 2024-06-05 LAB — RESPIRATORY PANEL BY PCR

## 2024-06-05 LAB — CBC
HCT: 36 % — ABNORMAL LOW (ref 39.0–52.0)
Hemoglobin: 11.6 g/dL — ABNORMAL LOW (ref 13.0–17.0)
MCH: 31.4 pg (ref 26.0–34.0)
MCHC: 32.2 g/dL (ref 30.0–36.0)
MCV: 97.3 fL (ref 80.0–100.0)
Platelets: 215 K/uL (ref 150–400)
RBC: 3.7 MIL/uL — ABNORMAL LOW (ref 4.22–5.81)
RDW: 13.2 % (ref 11.5–15.5)
WBC: 10.3 K/uL (ref 4.0–10.5)
nRBC: 0 % (ref 0.0–0.2)

## 2024-06-05 LAB — MAGNESIUM: Magnesium: 2.5 mg/dL — ABNORMAL HIGH (ref 1.7–2.4)

## 2024-06-05 LAB — PHOSPHORUS: Phosphorus: 3.9 mg/dL (ref 2.5–4.6)

## 2024-06-05 MED ORDER — QUETIAPINE FUMARATE 50 MG PO TABS: 50.0000 mg | ORAL_TABLET | Freq: Every evening | ORAL | Status: DC

## 2024-06-05 MED ORDER — APIXABAN 5 MG PO TABS
10.0000 mg | ORAL_TABLET | Freq: Two times a day (BID) | ORAL | Status: DC
  Filled 2024-06-05: qty 2

## 2024-06-05 MED ORDER — DEXTROSE 50 % IV SOLN
INTRAVENOUS | Status: AC
  Administered 2024-06-05: 12.5 g via INTRAVENOUS
  Filled 2024-06-05: qty 50

## 2024-06-05 MED ORDER — DEXTROSE 50 % IV SOLN: 12.5000 g | INTRAVENOUS | Status: AC

## 2024-06-05 MED ORDER — SENNA 8.6 MG PO TABS: 1.0000 | ORAL_TABLET | Freq: Two times a day (BID) | ORAL | Status: DC

## 2024-06-05 MED ORDER — IPRATROPIUM-ALBUTEROL 0.5-2.5 (3) MG/3ML IN SOLN
3.0000 mL | Freq: Two times a day (BID) | RESPIRATORY_TRACT | Status: DC
  Administered 2024-06-05 – 2024-06-08 (×6): 3 mL via RESPIRATORY_TRACT
  Filled 2024-06-05 (×6): qty 3

## 2024-06-05 MED ORDER — DOCUSATE SODIUM 100 MG PO CAPS: 100.0000 mg | ORAL_CAPSULE | Freq: Two times a day (BID) | ORAL | Status: DC

## 2024-06-05 MED ORDER — APIXABAN 5 MG PO TABS: 5.0000 mg | ORAL_TABLET | Freq: Two times a day (BID) | ORAL | Status: DC

## 2024-06-05 MED ORDER — THIAMINE MONONITRATE 100 MG PO TABS
100.0000 mg | ORAL_TABLET | Freq: Every day | ORAL | Status: AC
  Filled 2024-06-05: qty 1

## 2024-06-05 MED ORDER — POLYETHYLENE GLYCOL 3350 17 G PO PACK: 17.0000 g | PACK | Freq: Two times a day (BID) | ORAL | Status: DC

## 2024-06-05 MED ORDER — APIXABAN 5 MG PO TABS
10.0000 mg | ORAL_TABLET | Freq: Two times a day (BID) | ORAL | Status: DC
Start: 2024-06-05 — End: 2024-06-05

## 2024-06-05 MED ORDER — DEXTROSE 10 % IV SOLN: INTRAVENOUS | Status: DC

## 2024-06-05 NOTE — Procedures (Signed)
 Extubation Procedure Note  Patient Details:   Name: Martin Ford DOB: Sep 23, 1935 MRN: [id number]   Airway Documentation:    Vent end date: 06/05/24 Vent end time: 1610   Evaluation  O2 sats: stable throughout Complications: No apparent complications Patient did tolerate procedure well. Bilateral Breath Sounds: Diminished   Yes  Pt extubated per order to 4L [place]. Pt had a positive cuff leak, able to state name, weak cough and no stridor noted.   Shan LITTIE Collum 06/05/2024, 4:24 PM

## 2024-06-05 NOTE — Progress Notes (Signed)
 RT placed pt on bipap per MD. Pt tolerating it well at this time.   06/05/24 1705  Vent Select  Invasive or Noninvasive Noninvasive  Adult Vent Y  Adult Ventilator Settings  Vent Type Servo i  Humidity HME  Vent Mode BIPAP;PSV  FiO2 (%) 40 %  Pressure Support 5 cmH20  PEEP 5 cmH20  Adult Ventilator Measurements  Peak Airway Pressure 10 L/min  Mean Airway Pressure 7 cmH20  Resp Rate Spontaneous 19 br/min  Resp Rate Total 19 br/min  Spont TV 676 mL  Measured Ve 13.6 L  Auto PEEP 0 cmH20  Total PEEP 5 cmH20

## 2024-06-05 NOTE — Significant Event (Signed)
 DVT seen on venous doppler. Starting eliquis.

## 2024-06-05 NOTE — Progress Notes (Addendum)
 eLink Physician-Brief Progress Note Patient Name: Jovane Foutz DOB: April 21, 1936 MRN: [id number]   Date of Service  06/05/2024  HPI/Events of Note  Hypoglycemic despite receiving tube feeding No long-acting insulin in place CBG 60  eICU Interventions  Standard hypoglycemia protocol Continue escalating tube feeding D10 in the interim   0131 -add as needed labetalol for refractory hypertension  Intervention Category Intermediate Interventions: Hyperglycemia - evaluation and treatment  Lizmary Nader 06/05/2024, 8:47 PM

## 2024-06-05 NOTE — Progress Notes (Signed)
 Hypoglycemic Event  CBG: 60  Treatment: D50 25 mL (12.5 gm)  Symptoms: None  Follow-up CBG: Time:2049 CBG Result:127  Possible Reasons for Event: Patient is currently NPO  Notified Elink Nurse Dorma, RN) to MD notified:    Martin Ford

## 2024-06-05 NOTE — Progress Notes (Signed)
 NAME:  Martin Ford, MRN:  [id number], DOB:  Jan 08, 1936, LOS: 4 ADMISSION DATE:  06/01/2024 CHIEF COMPLAINT:  Dypnea.    History of Present Illness:  Pt is encephelopathic; therefore, this HPI is obtained from chart review. Martin Ford is a 88 y.o. male who has a PMH as below including but not limited to COPD Stage 3 by GOLD (PFT's Dec 2022 with FEV1 0.95 (38%), ratio 0.48) and chronically on 2L O2. He sees Dr. Darlean and just saw him as an outpatient on 05/14/24 and was prescribed a prednisone  taper and 10 day course of Doxycycline .   On 9/29, he had worsening dyspnea despite increase in O2 from baseline 2L to 4L. He was taken to [facility] ED where he required intubation. He was given 1 dose of Vancomycin and Cefepime empirically.   CTA chest demonstrated Bronchiectasis, no PE. CT head demonstrated no acute issues, generalized atrophy and sinus thickening.   He was later transferred to [facility] for further management.  PMH: has Centrilobular emphysema (HCC); Essential tremor; Essential hypertension; Vitamin D deficiency; Mixed hyperlipidemia; Hypothyroidism; Prediabetes; PVD (peripheral vascular disease); Stage 3 severe COPD by GOLD classification (HCC); DOE (dyspnea on exertion); Chronic respiratory failure with hypoxia (HCC); Ascending aortic aneurysm; Refused influenza vaccine; Pulmonary nodule; COPD with acute exacerbation (HCC); COPD (chronic obstructive pulmonary disease) (HCC); and Acute and chronic respiratory failure (acute-on-chronic) (HCC) on their problem list.   Interim History / Subjective:  Doing well on ketamine.  Following commands.  Reducing Precedex and ketamine.  On pressure support trial.  Potential extubation today.  Significant Hospital Events: 9/29: admit and intubated.   Objective    Blood pressure (!) 122/55, pulse 69, temperature 99 F (37.2 C), resp. rate 19, height 5' 11 (1.803 m), weight 71.2 kg, SpO2 100%.    Vent Mode: CPAP;PSV FiO2 (%):  [40 %-50 %] 40 % Set  Rate:  [16 bmp] 16 bmp Vt Set:  [490 mL] 490 mL PEEP:  [5 cmH20] 5 cmH20 Pressure Support:  [5 cmH20-10 cmH20] 5 cmH20 Plateau Pressure:  [17 cmH20-19 cmH20] 19 cmH20   Intake/Output Summary (Last 24 hours) at 06/05/2024 1347 Last data filed at 06/05/2024 1101 Gross per 24 hour  Intake 2334.77 ml  Output 885 ml  Net 1449.77 ml   Filed Weights   06/03/24 0431 06/04/24 0331 06/05/24 0500  Weight: 71 kg 70.2 kg 71.2 kg    Examination: General: Elderly male in no apparent distress was intubated.  Diminished breath sounds while Lungs: Improved breath sounds bilaterally today compared to yesterday. Heart: regular rate rhythm, no murmur appreciated.  Abdomen: non tender, non distended. Normal BS.  Neuro: Sedated and intubated.  Following some commands while being sedated. Bilateral pitting edema.  Assessment and Plan  Acute on chronic hypoxic/hypercapnic respiratory failure 2/2 AECOPD - COPD Stage 3 by GOLD (PFT's Dec 2022 with FEV1 0.95 (38%), ratio 0.48) and chronically on 2L O2. S/p intubation in ED 9/29 and given 1 dose of Vanc/Cefepime empirically. - LT VV. - Fentanyl changed to ketamine.  On Precedex for sedation.  Seroquel nightly. -  Echo showing grade 1 diastolic dysfunction.  Hold diuresis. - Budesonide nebs and as needed and scheduled DuoNebs. -RPP pending - Solu-Medrol 40 mg IV twice daily.  Continue. - Azithromycin  for COPD exacerbation.  Chest x-ray negative.  AKI: - Creatinine went to 2.24.  Post diuresis. - Improving after holding diuresis.  Mitral valve prolapse: -Based on the echo.  Mild MR. -Cardiology input regarding fixture when patient is extubated.  Multi valvular abnormalities: -Mitral valve prolapse as above.  In addition echo showing moderate AAS, mild to moderate AR, moderate TR. -Will get cardiology input as above.   Hypotension - 2/2 sedation-resolved. - Not epi discontinued.   Hyperglycemia - presumed 2/2 steroids. - SSI.   Moderate protein  calorie malnutrition: - Tube feed.  Leg swelling: - Right greater than left.  Getting lower extremity Doppler.  Updated wife who is at the bedside on 10/2.  Labs   CBC: Recent Labs  Lab 06/01/24 2105 06/01/24 2118 06/02/24 0438 06/02/24 0926 06/03/24 0359 06/04/24 0400 06/05/24 0422  WBC 12.5*  --  11.5* 8.0 12.2* 14.0* 10.3  NEUTROABS 7.6  --   --   --   --   --   --   HGB 13.6   < > 11.7* 11.7* 11.5* 10.9* 11.6*  HCT 43.2   < > 35.0* 35.4* 34.6* 34.1* 36.0*  MCV 100.2*  --  94.6 93.7 95.1 87.4 97.3  PLT 374  --  271 303 251 168 215   < > = values in this interval not displayed.    Basic Metabolic Panel: Recent Labs  Lab 06/02/24 0438 06/02/24 0926 06/03/24 0359 06/03/24 1751 06/04/24 0400 06/05/24 0422  NA 138 136 134* 137 137 141  K 4.3 4.4 4.1 4.0 4.8 4.5  CL 103 102 103 100 100 102  CO2 22 23 22 24  21* 28  GLUCOSE 163* 136* 131* 136* 120* 138*  BUN 13 15 24* 32* 94* 49*  CREATININE 1.08 0.79 1.03 1.27* 2.24* 1.01  CALCIUM  8.5* 8.7* 8.6* 8.4* 8.3* 8.5*  MG 1.8 2.0 2.4 2.2 2.6* 2.5*  PHOS 2.6 3.1 4.2  --  7.5* 3.9   GFR: Estimated Creatinine Clearance: 52.9 mL/min (by C-G formula based on SCr of 1.01 mg/dL). Recent Labs  Lab 06/01/24 2131 06/02/24 0438 06/02/24 0926 06/03/24 0359 06/04/24 0400 06/05/24 0422  WBC  --  11.5* 8.0 12.2* 14.0* 10.3  LATICACIDVEN 1.2 2.2* 1.7  --   --   --     Liver Function Tests: Recent Labs  Lab 06/02/24 0438  AST 20  ALT 15  ALKPHOS 47  BILITOT 2.1*  PROT 5.8*  ALBUMIN 3.2*   No results for input(s): LIPASE, AMYLASE in the last 168 hours. No results for input(s): AMMONIA in the last 168 hours.  ABG    Component Value Date/Time   PHART 7.25 (L) 06/01/2024 2130   PCO2ART 58 (H) 06/01/2024 2130   PO2ART 344 (H) 06/01/2024 2130   HCO3 26.1 06/02/2024 0436   TCO2 31 06/01/2024 2118   ACIDBASEDEF 2.8 (H) 06/01/2024 2130   O2SAT 84 06/02/2024 0436      Past Medical History:  He,  has a past  medical history of Emphysema of lung (HCC), Essential tremor, and Hypertension.   Surgical History:  History reviewed. No pertinent surgical history.   Social History:   reports that he quit smoking about 52 years ago. His smoking use included cigarettes. He has never used smokeless tobacco. He reports current alcohol use of about 2.0 standard drinks of alcohol per week. He reports that he does not use drugs.   Family History:  His family history includes Lung disease in his paternal grandfather.   Allergies No Known Allergies   Home Medications  Prior to Admission medications   Medication Sig Start Date End Date Taking? Authorizing Provider  albuterol  (VENTOLIN  HFA) 108 (90 Base) MCG/ACT inhaler USE 2 INHALATIONS BY MOUTH EVERY  6 HOURS AS NEEDED FOR WHEEZING  OR SHORTNESS OF BREATH 10/08/23  Yes Patel, Suzzane POUR, MD  Aspirin-Caffeine (BAYER BACK & BODY) 500-32.5 MG TABS Take 1 tablet by mouth 2 (two) times daily as needed (Pain).   Yes [provider]  ipratropium-albuterol  (DUONEB) 0.5-2.5 (3) MG/3ML SOLN Take 3 mLs by nebulization every 6 (six) hours as needed. 05/14/24  Yes Darlean Ozell NOVAK, MD  pantoprazole  (PROTONIX ) 40 MG tablet Take 1 tablet (40 mg total) by mouth daily. Take 30-60 min before first meal of the day Patient taking differently: Take 40 mg by mouth daily as needed (Indigestion). 03/18/24  Yes Darlean Ozell NOVAK, MD  doxycycline  (VIBRA -TABS) 100 MG tablet Take 1 tablet (100 mg total) by mouth 2 (two) times daily. Patient not taking: Reported on 06/02/2024 05/14/24   Darlean Ozell NOVAK, MD  predniSONE  (DELTASONE ) 10 MG tablet Take  4 each am x 2 days,   2 each am x 2 days,  1 each am x 2 days and stop Patient not taking: Reported on 06/02/2024 05/14/24   Darlean Ozell NOVAK, MD     CRITICAL CARE Performed by: Sammi JONETTA Fredericks.     Total critical care time: 45 minutes   Critical care time was exclusive of separately billable procedures and treating other patients.   Critical  care was necessary to treat or prevent imminent or life-threatening deterioration.   Critical care was time spent personally by me on the following activities: development of treatment plan with patient and/or surrogate as well as nursing, discussions with consultants, evaluation of patient's response to treatment, examination of patient, obtaining history from patient or surrogate, ordering and performing treatments and interventions, ordering and review of laboratory studies, ordering and review of radiographic studies, pulse oximetry, re-evaluation of patient's condition and participation in multidisciplinary rounds.  Sammi JONETTA Fredericks, MD Pulmonary, Critical Care and Sleep Attending.  Pager: [phone]  06/05/2024, 1:47 PM

## 2024-06-06 ENCOUNTER — Other Ambulatory Visit: Payer: Self-pay

## 2024-06-06 LAB — BASIC METABOLIC PANEL WITH GFR
Anion gap: 10 (ref 5–15)
BUN: 50 mg/dL — ABNORMAL HIGH (ref 8–23)
CO2: 28 mmol/L (ref 22–32)
Calcium: 8.7 mg/dL — ABNORMAL LOW (ref 8.9–10.3)
Chloride: 104 mmol/L (ref 98–111)
Creatinine, Ser: 0.97 mg/dL (ref 0.61–1.24)
GFR, Estimated: >60 mL/min (ref >60)
Glucose, Bld: 144 mg/dL — ABNORMAL HIGH (ref 70–99)
Potassium: 4.6 mmol/L (ref 3.5–5.1)
Sodium: 142 mmol/L (ref 135–145)

## 2024-06-06 LAB — CULTURE, BLOOD (ROUTINE X 2)
Culture: NO GROWTH
Culture: NO GROWTH

## 2024-06-06 LAB — GLUCOSE, CAPILLARY
Glucose-Capillary: 142 mg/dL — ABNORMAL HIGH (ref 70–99)
Glucose-Capillary: 121 mg/dL — ABNORMAL HIGH (ref 70–99)
Glucose-Capillary: 146 mg/dL — ABNORMAL HIGH (ref 70–99)
Glucose-Capillary: 119 mg/dL — ABNORMAL HIGH (ref 70–99)
Glucose-Capillary: 96 mg/dL (ref 70–99)

## 2024-06-06 LAB — CBC
HCT: 38.3 % — ABNORMAL LOW (ref 39.0–52.0)
Hemoglobin: 12.4 g/dL — ABNORMAL LOW (ref 13.0–17.0)
MCH: 31.5 pg (ref 26.0–34.0)
MCHC: 32.4 g/dL (ref 30.0–36.0)
MCV: 97.2 fL (ref 80.0–100.0)
Platelets: 229 K/uL (ref 150–400)
RBC: 3.94 MIL/uL — ABNORMAL LOW (ref 4.22–5.81)
RDW: 13.2 % (ref 11.5–15.5)
WBC: 16.3 K/uL — ABNORMAL HIGH (ref 4.0–10.5)
nRBC: 0 % (ref 0.0–0.2)

## 2024-06-06 LAB — MAGNESIUM: Magnesium: 2.4 mg/dL (ref 1.7–2.4)

## 2024-06-06 LAB — PHOSPHORUS
Phosphorus: 1.5 mg/dL — ABNORMAL LOW (ref 2.5–4.6)
Phosphorus: 3.7 mg/dL (ref 2.5–4.6)

## 2024-06-06 MED ORDER — METHYLPREDNISOLONE SODIUM SUCC 40 MG IJ SOLR
40.0000 mg | Freq: Every day | INTRAMUSCULAR | Status: DC
  Administered 2024-06-07: 40 mg via INTRAVENOUS
  Filled 2024-06-06: qty 1

## 2024-06-06 MED ORDER — LABETALOL HCL 5 MG/ML IV SOLN
10.0000 mg | INTRAVENOUS | Status: DC | PRN
  Administered 2024-06-06 – 2024-06-10 (×6): 10 mg via INTRAVENOUS
  Filled 2024-06-06 (×6): qty 4

## 2024-06-06 MED ORDER — PNEUMOCOCCAL 20-VAL CONJ VACC 0.5 ML IM SUSY
0.5000 mL | PREFILLED_SYRINGE | INTRAMUSCULAR | Status: AC | PRN
  Administered 2024-06-21: 0.5 mL via INTRAMUSCULAR
  Filled 2024-06-06: qty 0.5

## 2024-06-06 MED ORDER — INFLUENZA VAC SPLIT HIGH-DOSE 0.5 ML IM SUSY: 0.5000 mL | PREFILLED_SYRINGE | INTRAMUSCULAR | Status: DC | PRN

## 2024-06-06 MED ORDER — ENOXAPARIN SODIUM 80 MG/0.8ML IJ SOSY
70.0000 mg | PREFILLED_SYRINGE | Freq: Two times a day (BID) | INTRAMUSCULAR | Status: DC
  Administered 2024-06-06 – 2024-06-15 (×19): 70 mg via SUBCUTANEOUS
  Filled 2024-06-06: qty 0.7
  Filled 2024-06-06: qty 0.8
  Filled 2024-06-06: qty 0.7
  Filled 2024-06-06 (×4): qty 0.8
  Filled 2024-06-06: qty 0.7
  Filled 2024-06-06 (×5): qty 0.8
  Filled 2024-06-06 (×2): qty 0.7
  Filled 2024-06-06 (×6): qty 0.8

## 2024-06-06 MED ORDER — SODIUM PHOSPHATES 45 MMOLE/15ML IV SOLN
45.0000 mmol | Freq: Once | INTRAVENOUS | Status: AC
  Administered 2024-06-06: 45 mmol via INTRAVENOUS
  Filled 2024-06-06: qty 15

## 2024-06-06 NOTE — Plan of Care (Signed)
  Problem: Clinical Measurements: Goal: Ability to maintain clinical measurements within normal limits will improve Outcome: Progressing Goal: Respiratory complications will improve Outcome: Progressing   Problem: Education: Goal: Knowledge of General Education information will improve Description: Including pain rating scale, medication(s)/side effects and non-pharmacologic comfort measures Outcome: Not Progressing

## 2024-06-06 NOTE — Plan of Care (Signed)
  Problem: Education: Goal: Knowledge of General Education information will improve Description: Including pain rating scale, medication(s)/side effects and non-pharmacologic comfort measures Outcome: Progressing   Problem: Health Behavior/Discharge Planning: Goal: Ability to manage health-related needs will improve Outcome: Progressing   Problem: Clinical Measurements: Goal: Ability to maintain clinical measurements within normal limits will improve Outcome: Progressing Goal: Will remain free from infection Outcome: Progressing Goal: Diagnostic test results will improve Outcome: Progressing Goal: Respiratory complications will improve Outcome: Progressing Goal: Cardiovascular complication will be avoided Outcome: Progressing   Problem: Activity: Goal: Risk for activity intolerance will decrease Outcome: Progressing   Problem: Nutrition: Goal: Adequate nutrition will be maintained Outcome: Progressing   Problem: Coping: Goal: Level of anxiety will decrease Outcome: Progressing   Problem: Elimination: Goal: Will not experience complications related to bowel motility Outcome: Progressing Goal: Will not experience complications related to urinary retention Outcome: Progressing   Problem: Pain Managment: Goal: General experience of comfort will improve and/or be controlled Outcome: Progressing   Problem: Safety: Goal: Ability to remain free from injury will improve Outcome: Progressing   Problem: Skin Integrity: Goal: Risk for impaired skin integrity will decrease Outcome: Progressing   Problem: Safety: Goal: Non-violent Restraint(s) Outcome: Progressing   Problem: Education: Goal: Ability to describe self-care measures that may prevent or decrease complications (Diabetes Survival Skills Education) will improve Outcome: Progressing Goal: Individualized Educational Video(s) Outcome: Progressing   Problem: Coping: Goal: Ability to adjust to condition or change  in health will improve Outcome: Progressing   Problem: Fluid Volume: Goal: Ability to maintain a balanced intake and output will improve Outcome: Progressing   Problem: Health Behavior/Discharge Planning: Goal: Ability to identify and utilize available resources and services will improve Outcome: Progressing Goal: Ability to manage health-related needs will improve Outcome: Progressing   Problem: Metabolic: Goal: Ability to maintain appropriate glucose levels will improve Outcome: Progressing   Problem: Nutritional: Goal: Maintenance of adequate nutrition will improve Outcome: Progressing Goal: Progress toward achieving an optimal weight will improve Outcome: Progressing   Problem: Skin Integrity: Goal: Risk for impaired skin integrity will decrease Outcome: Progressing   Problem: Tissue Perfusion: Goal: Adequacy of tissue perfusion will improve Outcome: Progressing

## 2024-06-06 NOTE — Progress Notes (Signed)
 Pt oriented x1. Pt found attempting to get out of bed. Pt sitting at foot of bed on the edge with all lines including IV, Flexiseal, and condom cath pulled taut. Loss of IV access and condom cath noted. It took 4 Rns and 1 tech to get pt back to head of bed as pt could not stand for any periods of time and was unable to stand enough to get into steady.   Pt on 2 L , continuous pulse ox, tele, flexiseal, and new condom cath placed.   IV consult to place new access as pt is NPO and on continuous dextrose 10%.  Bed alarm on, fall matts in place

## 2024-06-06 NOTE — Progress Notes (Signed)
 Pharmacy Electrolyte Replacement  Recent Labs:  Recent Labs    06/06/24 0248  K 4.6  MG 2.4  PHOS 1.5*  CREATININE 0.97    Low Critical Values (K </= 2.5, Phos </= 1, Mg </= 1) Present: None  MD Contacted: Dr. Theodoro  Plan: NaPhos 45 mmol x1 Recheck in PM  Harlene Boga, PharmD, BCPS, BCCCP Clinical Pharmacist Please refer to [facility] for [facility] Pharmacy numbers 06/06/2024, 12:01 PM

## 2024-06-06 NOTE — Progress Notes (Addendum)
 NAME:  Martin Ford, MRN:  [id number], DOB:  Apr 26, 1936, LOS: 5 ADMISSION DATE:  06/01/2024 CHIEF COMPLAINT:  Dypnea.    History of Present Illness:  Pt is encephelopathic; therefore, this HPI is obtained from chart review. Martin Ford is a 88 y.o. male who has a PMH as below including but not limited to COPD Stage 3 by GOLD (PFT's Dec 2022 with FEV1 0.95 (38%), ratio 0.48) and chronically on 2L O2. He sees Dr. Darlean and just saw him as an outpatient on 05/14/24 and was prescribed a prednisone  taper and 10 day course of Doxycycline .   On 9/29, he had worsening dyspnea despite increase in O2 from baseline 2L to 4L. He was taken to [facility] ED where he required intubation. He was given 1 dose of Vancomycin and Cefepime empirically.   CTA chest demonstrated Bronchiectasis, no PE. CT head demonstrated no acute issues, generalized atrophy and sinus thickening.   He was later transferred to [facility] for further management.  PMH: has Centrilobular emphysema (HCC); Essential tremor; Essential hypertension; Vitamin D deficiency; Mixed hyperlipidemia; Hypothyroidism; Prediabetes; PVD (peripheral vascular disease); Stage 3 severe COPD by GOLD classification (HCC); DOE (dyspnea on exertion); Chronic respiratory failure with hypoxia (HCC); Ascending aortic aneurysm; Refused influenza vaccine; Pulmonary nodule; COPD with acute exacerbation (HCC); COPD (chronic obstructive pulmonary disease) (HCC); and Acute and chronic respiratory failure (acute-on-chronic) (HCC) on their problem list.   Interim History / Subjective:  Alert and oriented and following commands.  No overnight events.  Significant Hospital Events: 9/29: admit and intubated.  10/3: Extubated.  Objective    Blood pressure (!) 143/71, pulse 85, temperature 99.7 F (37.6 C), temperature source Axillary, resp. rate 20, height 5' 11 (1.803 m), weight 67.7 kg, SpO2 100%.    Vent Mode: BIPAP;PSV FiO2 (%):  [40 %] 40 % PEEP:  [5 cmH20] 5  cmH20 Pressure Support:  [5 cmH20-10 cmH20] 5 cmH20   Intake/Output Summary (Last 24 hours) at 06/06/2024 1401 Last data filed at 06/06/2024 0600 Gross per 24 hour  Intake 220.07 ml  Output 210 ml  Net 10.07 ml   Filed Weights   06/04/24 0331 06/05/24 0500 06/06/24 0458  Weight: 70.2 kg 71.2 kg 67.7 kg    Examination: General: Elderly male in no apparent distress Lungs: No wheezing appreciated today. Heart: regular rate rhythm, no murmur appreciated.  Abdomen: non tender, non distended. Normal BS.  Neuro: Alert and oriented x 3 and following commands.  Assessment and Plan  Acute on chronic hypoxic/hypercapnic respiratory failure 2/2 AECOPD - COPD Stage 3 by GOLD (PFT's Dec 2022 with FEV1 0.95 (38%), ratio 0.48) and chronically on 2L O2. S/p intubation in ED 9/29  - Extubated on 10/3. -  Echo showing grade 1 diastolic dysfunction.  Hold diuresis. - Budesonide nebs and as needed and scheduled DuoNebs. -Solu-Medrol changed to 40 mg daily. - Azithromycin  for COPD exacerbation.  Chest x-ray negative.  AKI: - Creatinine went to 2.24.  Post diuresis. - Improving after holding diuresis.  Mitral valve prolapse: -Based on the echo.  Mild MR. - cardiology consult.   Multi valvular abnormalities: -Mitral valve prolapse as above.  In addition echo showing moderate AAS, mild to moderate AR, moderate TR. - cardiology consult> curb side recs to follow as an outpatient per Dr  Ren Maha.    Hyperglycemia - presumed 2/2 steroids> now hypoglycemic.  - on D10 for now till swallowing improves.  - dc ssi.    Moderate protein calorie malnutrition: - poor swallow. Speech  evaluation.   B/L DVT: - initially started on eliquis but now on Lovenox therapeutic dose.  Diarrhea: - stool studies. Stop bowel regimen.   Transfer out of ICU today.   Labs   CBC: Recent Labs  Lab 06/01/24 2105 06/01/24 2118 06/02/24 0926 06/03/24 0359 06/04/24 0400 06/05/24 0422 06/06/24 0248  WBC  12.5*   < > 8.0 12.2* 14.0* 10.3 16.3*  NEUTROABS 7.6  --   --   --   --   --   --   HGB 13.6   < > 11.7* 11.5* 10.9* 11.6* 12.4*  HCT 43.2   < > 35.4* 34.6* 34.1* 36.0* 38.3*  MCV 100.2*   < > 93.7 95.1 87.4 97.3 97.2  PLT 374   < > 303 251 168 215 229   < > = values in this interval not displayed.    Basic Metabolic Panel: Recent Labs  Lab 06/02/24 0926 06/03/24 0359 06/03/24 1751 06/04/24 0400 06/05/24 0422 06/06/24 0248  NA 136 134* 137 137 141 142  K 4.4 4.1 4.0 4.8 4.5 4.6  CL 102 103 100 100 102 104  CO2 23 22 24  21* 28 28  GLUCOSE 136* 131* 136* 120* 138* 144*  BUN 15 24* 32* 94* 49* 50*  CREATININE 0.79 1.03 1.27* 2.24* 1.01 0.97  CALCIUM  8.7* 8.6* 8.4* 8.3* 8.5* 8.7*  MG 2.0 2.4 2.2 2.6* 2.5* 2.4  PHOS 3.1 4.2  --  7.5* 3.9 1.5*   GFR: Estimated Creatinine Clearance: 52.3 mL/min (by C-G formula based on SCr of 0.97 mg/dL). Recent Labs  Lab 06/01/24 2131 06/02/24 0438 06/02/24 0926 06/03/24 0359 06/04/24 0400 06/05/24 0422 06/06/24 0248  WBC  --  11.5* 8.0 12.2* 14.0* 10.3 16.3*  LATICACIDVEN 1.2 2.2* 1.7  --   --   --   --     Liver Function Tests: Recent Labs  Lab 06/02/24 0438  AST 20  ALT 15  ALKPHOS 47  BILITOT 2.1*  PROT 5.8*  ALBUMIN 3.2*   No results for input(s): LIPASE, AMYLASE in the last 168 hours. No results for input(s): AMMONIA in the last 168 hours.  ABG    Component Value Date/Time   PHART 7.25 (L) 06/01/2024 2130   PCO2ART 58 (H) 06/01/2024 2130   PO2ART 344 (H) 06/01/2024 2130   HCO3 26.1 06/02/2024 0436   TCO2 31 06/01/2024 2118   ACIDBASEDEF 2.8 (H) 06/01/2024 2130   O2SAT 84 06/02/2024 0436      Past Medical History:  He,  has a past medical history of Emphysema of lung (HCC), Essential tremor, and Hypertension.   Surgical History:  History reviewed. No pertinent surgical history.   Social History:   reports that he quit smoking about 52 years ago. His smoking use included cigarettes. He has never used  smokeless tobacco. He reports current alcohol use of about 2.0 standard drinks of alcohol per week. He reports that he does not use drugs.   Family History:  His family history includes Lung disease in his paternal grandfather.   Allergies No Known Allergies   Home Medications  Prior to Admission medications   Medication Sig Start Date End Date Taking? Authorizing Provider  albuterol  (VENTOLIN  HFA) 108 (90 Base) MCG/ACT inhaler USE 2 INHALATIONS BY MOUTH EVERY 6 HOURS AS NEEDED FOR WHEEZING  OR SHORTNESS OF BREATH 10/08/23  Yes Patel, Suzzane POUR, MD  Aspirin-Caffeine (BAYER BACK & BODY) 500-32.5 MG TABS Take 1 tablet by mouth 2 (two) times daily as  needed (Pain).   Yes [provider]  ipratropium-albuterol  (DUONEB) 0.5-2.5 (3) MG/3ML SOLN Take 3 mLs by nebulization every 6 (six) hours as needed. 05/14/24  Yes Darlean Ozell NOVAK, MD  pantoprazole  (PROTONIX ) 40 MG tablet Take 1 tablet (40 mg total) by mouth daily. Take 30-60 min before first meal of the day Patient taking differently: Take 40 mg by mouth daily as needed (Indigestion). 03/18/24  Yes Darlean Ozell NOVAK, MD  doxycycline  (VIBRA -TABS) 100 MG tablet Take 1 tablet (100 mg total) by mouth 2 (two) times daily. Patient not taking: Reported on 06/02/2024 05/14/24   Darlean Ozell NOVAK, MD  predniSONE  (DELTASONE ) 10 MG tablet Take  4 each am x 2 days,   2 each am x 2 days,  1 each am x 2 days and stop Patient not taking: Reported on 06/02/2024 05/14/24   Darlean Ozell NOVAK, MD     Total time spent 52 min.   Sammi JONETTA Fredericks, MD Pulmonary, Critical Care and Sleep Attending.  Pager: [phone]  06/06/2024, 2:01 PM

## 2024-06-06 NOTE — Evaluation (Signed)
 Clinical/Bedside Swallow Evaluation Patient Details  Name: Martin Ford MRN: [id number] Date of Birth: 06-12-1936  Today's Date: 06/06/2024 Time: SLP Start Time (ACUTE ONLY): 1452 SLP Stop Time (ACUTE ONLY): 1520 SLP Time Calculation (min) (ACUTE ONLY): 28 min  Past Medical History:  Past Medical History:  Diagnosis Date   Emphysema of lung (HCC)    Essential tremor    Hypertension    Past Surgical History: History reviewed. No pertinent surgical history. HPI:  Pt is a 88 y.o. male who presented with worsening dyspnea despite increase in O2 from baseline 2L to 4L. He was taken to [facility] ED where he required intubation. CTA chest demonstrated Bronchiectasis, no PE. CT head demonstrated no acute issues, generalized atrophy and sinus thickening. ETT 9/29- 10/3 (5 days). Failed [facility] Swallow Screen 10/4. PMH: COPD Stage 3 by GOLD (PFT's Dec 2022 with FEV1 0.95 (38%), ratio 0.48) and chronically on 2L O2.    Assessment / Plan / Recommendation  Clinical Impression  Pt presents with clinical signs of oropharyngeal dysphagia s/p extubation. There is a respiratory component which also is impacting swallow safety. Vocal quality dysphonic upon arrival, but improved with oral care and few chips. Cough was productive with pt expectorating large amounts of mucus into wash cloth. Oral motor exam WFL. Ice chips x5 were actively manipulated and cleared from oral cavity without clinical signs of aspiration to follow. Single small sips of thin were also unremarkable for signs of aspiration, however WOB began to increase with RR in mid 20s. With 3oz water swallow challenge attempt, pt with increased RR to upper 20s and he had to stop mid task in order to catch his breath. He did finish the 3oz, but with brief coughing to follow. Rest break provided before puree attempts. Despite smaller volumes, tsp of puree increased difficulty with coordination of breathing and swallowing, with pt making a sudden gulp sound when  swallowing, WOB increasing. Regular textured not attempted due to SOB. Given clinical presentation, recommend continue NPO with ice chips PRN after oral care when pt is alert. Will f/u for PO trials and hopeful a diet can be initiated with improved respiratory status.  SLP Visit Diagnosis: Dysphagia, unspecified (R13.10)    Aspiration Risk  Moderate aspiration risk    Diet Recommendation NPO;Ice chips PRN after oral care    Liquid Administration via: Spoon Medication Administration: Via alternative means Supervision: Staff to assist with self feeding;Full supervision/cueing for compensatory strategies Compensations: Minimize environmental distractions;Slow rate;Small sips/bites Postural Changes: Seated upright at 90 degrees    Other  Recommendations Oral Care Recommendations: Oral care QID;Oral care prior to ice chip/H20     Assistance Recommended at Discharge    Functional Status Assessment Patient has had a recent decline in their functional status and demonstrates the ability to make significant improvements in function in a reasonable and predictable amount of time.  Frequency and Duration min 2x/week  2 weeks       Prognosis Prognosis for improved oropharyngeal function: Good Barriers to Reach Goals:  (respiratory status)      Swallow Study   General Date of Onset: 06/06/24 HPI: Pt is a 88 y.o. male who presented with worsening dyspnea despite increase in O2 from baseline 2L to 4L. He was taken to [facility] ED where he required intubation. CTA chest demonstrated Bronchiectasis, no PE. CT head demonstrated no acute issues, generalized atrophy and sinus thickening. ETT 9/29- 10/3 (5 days). Failed [facility] Swallow Screen 10/4. PMH: COPD Stage 3 by GOLD (PFT's Dec  2022 with FEV1 0.95 (38%), ratio 0.48) and chronically on 2L O2. Type of Study: Bedside Swallow Evaluation Previous Swallow Assessment: none per EMR Diet Prior to this Study: NPO Temperature Spikes Noted: Yes Respiratory Status:  Nasal cannula History of Recent Intubation: Yes Total duration of intubation (days): 5 days Date extubated: 06/05/24 Behavior/Cognition: Alert;Cooperative;Pleasant mood;Confused Oral Cavity Assessment: Dried secretions Oral Care Completed by SLP: Yes Oral Cavity - Dentition: Missing dentition;Edentulous Vision: Functional for self-feeding Self-Feeding Abilities: Able to feed self;Needs assist Patient Positioning: Upright in bed;Postural control adequate for testing Baseline Vocal Quality: Breathy;Low vocal intensity Volitional Cough: Strong;Congested Volitional Swallow: Able to elicit    Oral/Motor/Sensory Function Overall Oral Motor/Sensory Function: Within functional limits   Ice Chips Ice chips: Within functional limits Presentation: Spoon   Thin Liquid Thin Liquid: Impaired Presentation: Self Fed;Straw Pharyngeal  Phase Impairments: Cough - Delayed    Nectar Thick Nectar Thick Liquid: Not tested   Honey Thick Honey Thick Liquid: Not tested   Puree Puree: Impaired Presentation: Self Fed (fork) Pharyngeal Phase Impairments:  (increased WOB, gulping sound when trying to swallow)   Solid     Solid: Not tested       Martin Kin, MA, CCC-SLP Acute Rehabilitation Services Office Number: [phone]  Martin KANDICE Ford 06/06/2024,3:51 PM

## 2024-06-07 ENCOUNTER — Inpatient Hospital Stay (HOSPITAL_COMMUNITY)

## 2024-06-07 DIAGNOSIS — J449 Chronic obstructive pulmonary disease, unspecified: Secondary | ICD-10-CM

## 2024-06-07 LAB — CBC WITH DIFFERENTIAL/PLATELET
Abs Immature Granulocytes: 0.09 K/uL — ABNORMAL HIGH (ref 0.00–0.07)
Basophils Absolute: 0 K/uL (ref 0.0–0.1)
Basophils Relative: 0 %
Eosinophils Absolute: 0 K/uL (ref 0.0–0.5)
Eosinophils Relative: 0 %
HCT: 38.6 % — ABNORMAL LOW (ref 39.0–52.0)
Hemoglobin: 12.3 g/dL — ABNORMAL LOW (ref 13.0–17.0)
Immature Granulocytes: 1 %
Lymphocytes Relative: 2 %
Lymphs Abs: 0.2 K/uL — ABNORMAL LOW (ref 0.7–4.0)
MCH: 31.3 pg (ref 26.0–34.0)
MCHC: 31.9 g/dL (ref 30.0–36.0)
MCV: 98.2 fL (ref 80.0–100.0)
Monocytes Absolute: 0.5 K/uL (ref 0.1–1.0)
Monocytes Relative: 3 %
Neutro Abs: 13.6 K/uL — ABNORMAL HIGH (ref 1.7–7.7)
Neutrophils Relative %: 94 %
Platelets: 250 K/uL (ref 150–400)
RBC: 3.93 MIL/uL — ABNORMAL LOW (ref 4.22–5.81)
RDW: 13.4 % (ref 11.5–15.5)
WBC: 14.4 K/uL — ABNORMAL HIGH (ref 4.0–10.5)
nRBC: 0 % (ref 0.0–0.2)

## 2024-06-07 LAB — COMPREHENSIVE METABOLIC PANEL WITH GFR
ALT: 27 U/L (ref 0–44)
AST: 18 U/L (ref 15–41)
Albumin: 2.8 g/dL — ABNORMAL LOW (ref 3.5–5.0)
Alkaline Phosphatase: 55 U/L (ref 38–126)
Anion gap: 14 (ref 5–15)
BUN: 42 mg/dL — ABNORMAL HIGH (ref 8–23)
CO2: 30 mmol/L (ref 22–32)
Calcium: 8.6 mg/dL — ABNORMAL LOW (ref 8.9–10.3)
Chloride: 102 mmol/L (ref 98–111)
Creatinine, Ser: 0.9 mg/dL (ref 0.61–1.24)
GFR, Estimated: >60 mL/min (ref >60)
Glucose, Bld: 146 mg/dL — ABNORMAL HIGH (ref 70–99)
Potassium: 3.9 mmol/L (ref 3.5–5.1)
Sodium: 146 mmol/L — ABNORMAL HIGH (ref 135–145)
Total Bilirubin: 1.8 mg/dL — ABNORMAL HIGH (ref 0.0–1.2)
Total Protein: 6.1 g/dL — ABNORMAL LOW (ref 6.5–8.1)

## 2024-06-07 LAB — GLUCOSE, CAPILLARY
Glucose-Capillary: 111 mg/dL — ABNORMAL HIGH (ref 70–99)
Glucose-Capillary: 117 mg/dL — ABNORMAL HIGH (ref 70–99)
Glucose-Capillary: 138 mg/dL — ABNORMAL HIGH (ref 70–99)
Glucose-Capillary: 93 mg/dL (ref 70–99)
Glucose-Capillary: 94 mg/dL (ref 70–99)

## 2024-06-07 LAB — C-REACTIVE PROTEIN: CRP: 12.7 mg/dL — ABNORMAL HIGH (ref <1.0)

## 2024-06-07 LAB — PROCALCITONIN: Procalcitonin: 0.14 ng/mL

## 2024-06-07 LAB — MAGNESIUM: Magnesium: 2.2 mg/dL (ref 1.7–2.4)

## 2024-06-07 MED ORDER — GUAIFENESIN ER 600 MG PO TB12
600.0000 mg | ORAL_TABLET | Freq: Two times a day (BID) | ORAL | Status: DC
  Filled 2024-06-07 (×2): qty 1

## 2024-06-07 MED ORDER — QUETIAPINE FUMARATE 25 MG PO TABS
25.0000 mg | ORAL_TABLET | Freq: Every evening | ORAL | Status: DC
  Administered 2024-06-08 – 2024-06-14 (×7): 25 mg via ORAL
  Filled 2024-06-07 (×7): qty 1

## 2024-06-07 MED ORDER — PANTOPRAZOLE SODIUM 40 MG PO TBEC
40.0000 mg | DELAYED_RELEASE_TABLET | Freq: Every day | ORAL | Status: DC
  Administered 2024-06-08: 40 mg via ORAL
  Filled 2024-06-07: qty 1

## 2024-06-07 MED ORDER — HALOPERIDOL LACTATE 5 MG/ML IJ SOLN
2.0000 mg | Freq: Four times a day (QID) | INTRAMUSCULAR | Status: DC | PRN
  Administered 2024-06-07 – 2024-06-19 (×15): 2 mg via INTRAVENOUS
  Filled 2024-06-07 (×16): qty 1

## 2024-06-07 MED ORDER — METHYLPREDNISOLONE SODIUM SUCC 40 MG IJ SOLR
40.0000 mg | Freq: Two times a day (BID) | INTRAMUSCULAR | Status: DC
  Administered 2024-06-07 – 2024-06-11 (×8): 40 mg via INTRAVENOUS
  Filled 2024-06-07 (×8): qty 1

## 2024-06-07 MED ORDER — LORAZEPAM 2 MG/ML IJ SOLN
0.2500 mg | Freq: Once | INTRAMUSCULAR | Status: AC
  Administered 2024-06-07: 0.25 mg via INTRAVENOUS
  Filled 2024-06-07: qty 1

## 2024-06-07 NOTE — Progress Notes (Signed)
 Speech Language Pathology Treatment: Dysphagia  Patient Details Name: Martin Ford MRN: [id number] DOB: May 02, 1936 Today's Date: 06/07/2024 Time: 8554-8494 SLP Time Calculation (min) (ACUTE ONLY): 20 min  Assessment / Plan / Recommendation Clinical Impression  Patient seen by SLP for skilled treatment focused on dysphagia goals. His spouse was in the room and she indicated that today, his speech sounds more slurred than it did yesterday. SLP judged patient's voice to be hoarse but as compared to initial evaluation previous date, appears to be stronger overall. When he coughed, voice sounded congested but he was not able to expectorate any secretions. He was agitated earlier, trying to get out of bed on his own and was given Haldol approximately 1300. SLP assessed his toleration of PO's of single ice chips, followed by spoon sips water and attempt at a cup sip of water. Patient tolerated ice chips and spoon sips water with minimal frequency of throat clearing but no coughing. With small, controlled cup sip (administered by SLP) patient exhibited a cough response after which he verbalized, it went down the wrong pipe. SLP then switched back to spoon sips and patient indicated that felt better to him when swallowing. SLP recommending continue NPO, but allow spoon sips water in addition to ice chips that were initiated previous date. SLP will continue to follow for PO readiness.   HPI HPI: Pt is a 88 y.o. male who presented with worsening dyspnea despite increase in O2 from baseline 2L to 4L. He was taken to [facility] ED where he required intubation. CTA chest demonstrated Bronchiectasis, no PE. CT head demonstrated no acute issues, generalized atrophy and sinus thickening. ETT 9/29- 10/3 (5 days). Failed [facility] Swallow Screen 10/4. PMH: COPD Stage 3 by GOLD (PFT's Dec 2022 with FEV1 0.95 (38%), ratio 0.48) and chronically on 2L O2.      SLP Plan  Continue with current plan of care           Recommendations  Diet recommendations: NPO;Other(comment) (ice chips and water sips via spoon only) Liquids provided via: Teaspoon Medication Administration: Via alternative means Supervision: Full supervision/cueing for compensatory strategies;Staff to assist with self feeding Compensations: Minimize environmental distractions;Slow rate;Small sips/bites                  Oral care QID;Oral care prior to ice chip/H20   Frequent or constant Supervision/Assistance Dysphagia, unspecified (R13.10)     Continue with current plan of care     Martin IVAR Blase, MA, CCC-SLP Speech Therapy

## 2024-06-07 NOTE — Hospital Course (Addendum)
 [name]

## 2024-06-07 NOTE — Progress Notes (Signed)
 [facility] Progress Note Patient: Martin Ford FMW:[id number] DOB: [birth year]  DOA: 06/01/2024 DOS: the patient was seen and examined on 06/07/2024  Brief Hospital Course: Patient with PMH of COPD, GERD, chronic respiratory failure on 2 LPM, aortic stenosis presented to the hospital with complaints of confusion and shortness of breath. He sees Dr. Darlean and just saw him as an outpatient on 05/14/24 and was prescribed a prednisone  taper and 10 day course of Doxycycline .   On 9/29, he had worsening dyspnea despite increase in O2 from baseline 2L to 4L. He was taken to [facility] ED where he required intubation. He was given 1 dose of Vancomycin and Cefepime empirically.   CTA chest demonstrated Bronchiectasis, no PE. CT head demonstrated no acute issues, generalized atrophy and sinus thickening.   He was later transferred to [facility] for further management.  Assessment and Plan: Acute on chronic hypoxic/hypercapnic respiratory failure 2/2 AECOPD - COPD Required intubation on 9/29. Uses 2 LPM chronically at home. Currently on 2 LPM. Was on BiPAP. Currently BiPAP is ordered although not indicated. Will change BiPAP to as needed. Patient appears to be having very poor cough. Will initiate chest PT. Patient was transferred to MedSurg unit although unable to perform BiPAP and therefore patient will be transferred to progressive care unit for BiPAP as needed. Received IV antibiotics earlier. Increase Solu-Medrol.   AKI: - Creatinine went to 2.24.  Post diuresis. Renal function improving.  normal values.   Mitral valve prolapse. Moderate aortic stenosis. Moderate aortic regurgitation. ICU discussed with cardiologist Dr  Ren Maha.  recs to follow as an outpatient   Hyperglycemia - presumed 2/2 steroids> now hypoglycemic.  - on D10 for now till swallowing improves.  - dc ssi.    Moderate protein calorie malnutrition:  poor swallow. Speech evaluation.  Currently NPO.   B/L DVT: Acute.   Distal bilateral. initially started on eliquis but now on Lovenox therapeutic dose.   Diarrhea: Likely associated with tube feeding. Currently no GI symptoms. No further workup.  Delirium. Patient is somewhat confused and agitated. Required Precedex in the ICU. Per wife at bedside patient remains delirious today. Will monitor closely. Initiate Seroquel again which was given to the patient in the ICU. Continue BiPAP as needed.  Goals of care conversation. Discussed with wife at bedside. Patient has a living will that states that if he is not evaluated to state he would like to be resuscitated. But in a vegetative state he would not like to be kept alive artificially in machine. Monitor.  Moderate protein calorie malnutrition Body mass index is 20.82 kg/m.  Continue supplement. Renders poor prognosis for the patient.   Subjective: Denies any acute complaint.  No nausea vomiting fever no chills.  Was agitated trying to get out of the bed.  Appears short of breath.  Wife mentions the patient is confused and not at his baseline.  Physical Exam: Bilateral rhonchi and crackles. Bilateral expiratory wheezes. Bowel sound present. Nontender. Oral mucosa clear. Pupils are equal and reactive to light. No asterixis. Alert awake and oriented to self. No edema.  Data Reviewed: I have Reviewed nursing notes, Vitals, and Lab results. Since last encounter, pertinent lab results CBC and BMP   . I have ordered test including CBC and BMP  . I have ordered imaging chest x-ray  .   Disposition: Status is: Inpatient Remains inpatient appropriate because: Monitor for improvement in mentation and respiratory status  SCDs Start: 06/02/24 0135   Family Communication: Wife at bedside  Level of care: Progressive   Vitals:   06/07/24 1747 06/07/24 1800 06/07/24 1946 06/07/24 2025  BP: (!) 158/77   (!) 148/68  Pulse: 75 75  74  Resp: 19 (!) 26  20  Temp: 97.6 F (36.4 C)   98.6 F (37 C)   TempSrc: Oral   Oral  SpO2: 99% 99% 98% 100%  Weight:      Height:         Author: Yetta Blanch, MD 06/07/2024 8:37 PM  Please look on [email or web address] to find out who is on call.

## 2024-06-08 ENCOUNTER — Inpatient Hospital Stay (HOSPITAL_COMMUNITY)

## 2024-06-08 DIAGNOSIS — J449 Chronic obstructive pulmonary disease, unspecified: Secondary | ICD-10-CM | POA: Diagnosis not present

## 2024-06-08 LAB — CBC
HCT: 39 % (ref 39.0–52.0)
Hemoglobin: 12.3 g/dL — ABNORMAL LOW (ref 13.0–17.0)
MCH: 31.3 pg (ref 26.0–34.0)
MCHC: 31.5 g/dL (ref 30.0–36.0)
MCV: 99.2 fL (ref 80.0–100.0)
Platelets: 253 K/uL (ref 150–400)
RBC: 3.93 MIL/uL — ABNORMAL LOW (ref 4.22–5.81)
RDW: 13.2 % (ref 11.5–15.5)
WBC: 9 K/uL (ref 4.0–10.5)
nRBC: 0 % (ref 0.0–0.2)

## 2024-06-08 LAB — GLUCOSE, CAPILLARY
Glucose-Capillary: 126 mg/dL — ABNORMAL HIGH (ref 70–99)
Glucose-Capillary: 133 mg/dL — ABNORMAL HIGH (ref 70–99)
Glucose-Capillary: 140 mg/dL — ABNORMAL HIGH (ref 70–99)
Glucose-Capillary: 141 mg/dL — ABNORMAL HIGH (ref 70–99)
Glucose-Capillary: 147 mg/dL — ABNORMAL HIGH (ref 70–99)
Glucose-Capillary: 149 mg/dL — ABNORMAL HIGH (ref 70–99)
Glucose-Capillary: 155 mg/dL — ABNORMAL HIGH (ref 70–99)

## 2024-06-08 LAB — HEPATIC FUNCTION PANEL
ALT: 24 U/L (ref 0–44)
AST: 17 U/L (ref 15–41)
Albumin: 2.8 g/dL — ABNORMAL LOW (ref 3.5–5.0)
Alkaline Phosphatase: 51 U/L (ref 38–126)
Bilirubin, Direct: 0.3 mg/dL — ABNORMAL HIGH (ref 0.0–0.2)
Indirect Bilirubin: 1.1 mg/dL — ABNORMAL HIGH (ref 0.3–0.9)
Total Bilirubin: 1.4 mg/dL — ABNORMAL HIGH (ref 0.0–1.2)
Total Protein: 6.2 g/dL — ABNORMAL LOW (ref 6.5–8.1)

## 2024-06-08 LAB — BASIC METABOLIC PANEL WITH GFR
Anion gap: 9 (ref 5–15)
Anion gap: 10 (ref 5–15)
Anion gap: 10 (ref 5–15)
BUN: 45 mg/dL — ABNORMAL HIGH (ref 8–23)
BUN: 45 mg/dL — ABNORMAL HIGH (ref 8–23)
BUN: 46 mg/dL — ABNORMAL HIGH (ref 8–23)
CO2: 32 mmol/L (ref 22–32)
CO2: 32 mmol/L (ref 22–32)
CO2: 30 mmol/L (ref 22–32)
Calcium: 8.8 mg/dL — ABNORMAL LOW (ref 8.9–10.3)
Calcium: 8.6 mg/dL — ABNORMAL LOW (ref 8.9–10.3)
Calcium: 8.7 mg/dL — ABNORMAL LOW (ref 8.9–10.3)
Chloride: 106 mmol/L (ref 98–111)
Chloride: 106 mmol/L (ref 98–111)
Chloride: 107 mmol/L (ref 98–111)
Creatinine, Ser: 0.85 mg/dL (ref 0.61–1.24)
Creatinine, Ser: 0.88 mg/dL (ref 0.61–1.24)
Creatinine, Ser: 0.82 mg/dL (ref 0.61–1.24)
GFR, Estimated: >60 mL/min (ref >60)
GFR, Estimated: >60 mL/min (ref >60)
GFR, Estimated: >60 mL/min (ref >60)
Glucose, Bld: 147 mg/dL — ABNORMAL HIGH (ref 70–99)
Glucose, Bld: 151 mg/dL — ABNORMAL HIGH (ref 70–99)
Glucose, Bld: 174 mg/dL — ABNORMAL HIGH (ref 70–99)
Potassium: 3.8 mmol/L (ref 3.5–5.1)
Potassium: 3.9 mmol/L (ref 3.5–5.1)
Potassium: 3.8 mmol/L (ref 3.5–5.1)
Sodium: 147 mmol/L — ABNORMAL HIGH (ref 135–145)
Sodium: 148 mmol/L — ABNORMAL HIGH (ref 135–145)
Sodium: 147 mmol/L — ABNORMAL HIGH (ref 135–145)

## 2024-06-08 LAB — MAGNESIUM: Magnesium: 2.2 mg/dL (ref 1.7–2.4)

## 2024-06-08 LAB — PROCALCITONIN: Procalcitonin: 0.12 ng/mL

## 2024-06-08 MED ORDER — DEXTROSE 5 % IV SOLN
INTRAVENOUS | Status: DC
Start: 2024-06-08 — End: 2024-06-09

## 2024-06-08 MED ORDER — PROSOURCE TF20 ENFIT COMPATIBL EN LIQD
60.0000 mL | Freq: Every day | ENTERAL | Status: DC
  Administered 2024-06-09 – 2024-06-15 (×7): 60 mL
  Filled 2024-06-08 (×7): qty 60

## 2024-06-08 MED ORDER — FREE WATER
100.0000 mL | Status: DC
  Administered 2024-06-08 – 2024-06-09 (×5): 100 mL

## 2024-06-08 MED ORDER — HYDRALAZINE HCL 20 MG/ML IJ SOLN
10.0000 mg | INTRAMUSCULAR | Status: DC | PRN
  Administered 2024-06-08 – 2024-06-17 (×5): 10 mg via INTRAVENOUS
  Filled 2024-06-08 (×6): qty 1

## 2024-06-08 MED ORDER — OSMOLITE 1.2 CAL PO LIQD
1080.0000 mL | ORAL | Status: DC
  Administered 2024-06-08 – 2024-06-12 (×5): 1080 mL
  Filled 2024-06-08 (×5): qty 1185

## 2024-06-08 MED ORDER — VITAMIN C 500 MG PO TABS
1000.0000 mg | ORAL_TABLET | Freq: Three times a day (TID) | ORAL | Status: AC
  Administered 2024-06-08 – 2024-06-15 (×21): 1000 mg
  Filled 2024-06-08 (×21): qty 2

## 2024-06-08 MED ORDER — OSMOLITE 1.2 CAL PO LIQD
1000.0000 mL | ORAL | Status: DC
  Filled 2024-06-08: qty 1000

## 2024-06-08 MED ORDER — DEXTROSE-SODIUM CHLORIDE 5-0.45 % IV SOLN: INTRAVENOUS | Status: DC

## 2024-06-08 MED ORDER — SODIUM CHLORIDE 0.9 % IV SOLN
3.0000 g | Freq: Three times a day (TID) | INTRAVENOUS | Status: AC
  Administered 2024-06-08 – 2024-06-12 (×13): 3 g via INTRAVENOUS
  Filled 2024-06-08 (×13): qty 8

## 2024-06-08 NOTE — Progress Notes (Signed)
 Nutrition Follow-up  DOCUMENTATION CODES:  Non-severe (moderate) malnutrition in context of chronic illness  INTERVENTION:  Initiate tube feeding via Cortrak: Osmolite 1.2 at 45 ml/h (1080 ml per day) Start at 25 and advance by 10mL every 12 hours to reach goal Prosource TF20 60 ml 1x/d Provides 1700 kcal, 87 gm protein, 823 ml free water daily Pt remains risk for refeeding syndrome given malnutrition and no nutrition x 3 days. Monitor magnesium and phosphorus daily x 3 days, MD to replete as needed.  NUTRITION DIAGNOSIS:  Moderate Malnutrition related to chronic illness (COPD) as evidenced by moderate fat depletion, moderate muscle depletion. - ongoing   GOAL:  Patient will meet greater than or equal to 90% of their needs - not met   MONITOR:  TF tolerance, Labs  REASON FOR ASSESSMENT:  Consult Enteral/tube feeding initiation and management  ASSESSMENT:  Pt with hx of COPD and HTN presented to ED with respiratory distress. Workup suggestive of COPD exacerbation, intubated in ED.    10/3 - Extubated  10/6 cortrak placed   Pt extubated on 10/3, OGT removed and tube feeds discontinued. Feeds were at goal rate prior to discontinuation. Seen by SLP x3 since extubation recommending NPO with AMN. Consult placed for cortrak placement and initiation of tube feeds. Oriented to person only, spouse at bedside provided patient hx. She reports patient has been losing weight over the past several months but was unable to quantify amount or provide usual body weight.  Patient with declining PO intake at home, spouse reports patient has been eating his usual three meals a day but in smaller porions, not drinking nutrition supplements. No swallowing issues reported prior to intubation.100 mg thiamine ordered on 9/30, 4 doses given. Patient remains at high refeeding risk due to malnutrition and lack of oral/enteral intake x 3 days.    7% weight loss noted in the last 3 months, not severe but  concerning for age. Weight loss may be more significant as pt has pitting edema to the BLE. Weight Information (since admission)     Date/Time Weight Weight in lbs BSA (Calculated - sq m) BMI (Calculated) Who   06/08/24 0433 62.9 kg 138.67 lbs -- 19.35 VS   06/06/24 0458 67.7 kg 149.25 lbs -- 20.83 JW   06/05/24 0500 71.2 kg 156.97 lbs -- 21.9 JW   06/04/24 0331 70.2 kg 154.76 lbs -- 21.59 EM   06/03/24 0431 71 kg 156.53 lbs -- 21.84 EM   06/02/24 0412 74.2 kg 163.58 lbs -- 22.83 PH   06/01/24 2056 68 kg 150 lbs 1.85 sq meters 20.93 TD     Intake/Output Summary (Last 24 hours) at 06/08/2024 1553 Last data filed at 06/08/2024 0659 Gross per 24 hour  Intake 350 ml  Output 550 ml  Net -200 ml  Net IO Since Admission: 4,768.65 mL [06/08/24 1553]  Nutritionally Relevant Medications: Scheduled Meds: Reviewed   PRN Meds: ondansetron   Labs Reviewed: Sodium 147 Glu 147  Bun 45  Calcium  8.8   NUTRITION - FOCUSED PHYSICAL EXAM: Flowsheet Row Most Recent Value  Orbital Region Mild depletion  Upper Arm Region Severe depletion  Thoracic and Lumbar Region Moderate depletion  Buccal Region Mild depletion  Temple Region Moderate depletion  Clavicle Bone Region Moderate depletion  Clavicle and Acromion Bone Region Severe depletion  Scapular Bone Region Severe depletion  Dorsal Hand Mild depletion  Patellar Region Moderate depletion  Anterior Thigh Region Moderate depletion  Posterior Calf Region Mild depletion  Edema (RD  Assessment) Moderate  [BLE]  Hair Reviewed  Eyes Reviewed  Mouth Reviewed  Skin Reviewed  Nails Reviewed    Diet Order:   Diet Order             Diet NPO time specified Except for: Ice Chips, Other (See Comments)  Diet effective now                   EDUCATION NEEDS:  Not appropriate for education at this time  Skin:  Skin Assessment: Reviewed RN Assessment  Last BM:  10/6 type 7  Height:  Ht Readings from Last 1 Encounters:  06/01/24 5' 11  (1.803 m)    Weight:  Wt Readings from Last 1 Encounters:  06/08/24 62.9 kg    Ideal Body Weight:  78.2 kg  BMI:  Body mass index is 19.34 kg/m.  Estimated Nutritional Needs:  Kcal:  1700-1900 kcal/d Protein:  80-100g/d Fluid:  >/=1.8L/d  Madalyn Potters, MS, RD, LDN Clinical Dietitian  Please see AMiON for contact information.

## 2024-06-08 NOTE — Procedures (Signed)
 Cortrak  Person Inserting Tube:  Elihue Josette RAMAN, RD Tube Type:  Cortrak - 43 inches Tube Size:  10 Tube Location:  Left nare Secured by: Bridle Initial Placement:  Gastric Technique Used to Measure Tube Placement:  Marking at nare/corner of mouth Cortrak Secured At:  73 cm Initial Placement Verification:  Cortrak device (Registered Dieticians Only)  Cortrak Tube Team Note:  Consult received to place a Cortrak feeding tube.   No x-ray is required. RN may begin using tube.   If the tube becomes dislodged please keep the tube and contact the Cortrak team at [email or web address] for replacement.  If after hours and replacement cannot be delayed, place a NG tube and confirm placement with an abdominal x-ray.    Josette Elihue, MS, RDN, LDN Clinical Dietitian I Please reach out via secure chat

## 2024-06-08 NOTE — Evaluation (Signed)
 Occupational Therapy Evaluation Patient Details Name: Martin Ford MRN: [id number] DOB: Feb 10, 1936 Today's Date: 06/08/2024   History of Present Illness   The pt is an 88 yo male presenting to [facility] 9/29 for SOB and progressive AMS. Pt intubated 9/29 and transferred to [facility] 9/30, extubated 10/3. DVT found 10/3. PMH includes: COPD stage 3 chronically on 2L O2, HTN, HLD, hypothyroidism, PVD, and essential tremor.     Clinical Impressions Patient admitted for the diagnosis above.  PTA he lives at home with his spouse, used a 2WRW in the home and a [facility] for short distances in the community.  Per the spouse, no assist with ADL or medication management.  Patient presents with the deficits below, needing up to Mod A of two for basic transfers, and bedlevel Max A for lower body ADL.  Spouse unable to provide the needed physical assist to transition home, Patient will benefit from continued inpatient follow up therapy, <3 hours/day.  OT will continue efforts in the acute setting.       If plan is discharge home, recommend the following:   Two people to help with walking and/or transfers;A lot of help with bathing/dressing/bathroom     Functional Status Assessment   Patient has had a recent decline in their functional status and demonstrates the ability to make significant improvements in function in a reasonable and predictable amount of time.     Equipment Recommendations   None recommended by OT     Recommendations for Other Services         Precautions/Restrictions   Precautions Precautions: Fall Recall of Precautions/Restrictions: Impaired Precaution/Restrictions Comments: B mitts Restrictions Weight Bearing Restrictions Per Provider Order: No     Mobility Bed Mobility Overal bed mobility: Needs Assistance Bed Mobility: Sidelying to Sit   Sidelying to sit: Max assist         Patient Response: Cooperative  Transfers Overall transfer level: Needs  assistance Equipment used: 2 person hand held assist Transfers: Bed to chair/wheelchair/BSC, Sit to/from Stand Sit to Stand: Mod assist, +2 physical assistance     Step pivot transfers: Mod assist, +2 physical assistance, Max assist            Balance Overall balance assessment: Needs assistance Sitting-balance support: Feet supported Sitting balance-Leahy Scale: Fair     Standing balance support: Bilateral upper extremity supported Standing balance-Leahy Scale: Poor                             ADL either performed or assessed with clinical judgement   ADL Overall ADL's : Needs assistance/impaired Eating/Feeding: Minimal assistance;Sitting   Grooming: Wash/dry face;Minimal assistance;Sitting   Upper Body Bathing: Moderate assistance;Sitting   Lower Body Bathing: Maximal assistance;Bed level   Upper Body Dressing : Moderate assistance;Sitting   Lower Body Dressing: Maximal assistance;Bed level   Toilet Transfer: Moderate assistance;+2 for physical assistance;Stand-pivot;BSC/3in1   Toileting- Clothing Manipulation and Hygiene: Total assistance;Sit to/from stand               Vision Baseline Vision/History: 1 Wears glasses Vision Assessment?: No apparent visual deficits     Perception Perception: Not tested       Praxis Praxis: Not tested       Pertinent Vitals/Pain Pain Assessment Pain Assessment: Faces Faces Pain Scale: Hurts a little bit Pain Location: generalized with activity Pain Descriptors / Indicators: Aching Pain Intervention(s): Monitored during session     Extremity/Trunk Assessment Upper Extremity Assessment  Upper Extremity Assessment: Generalized weakness   Lower Extremity Assessment Lower Extremity Assessment: Defer to PT evaluation   Cervical / Trunk Assessment Cervical / Trunk Assessment: Kyphotic   Communication Communication Communication: Impaired Factors Affecting Communication: Hearing impaired;Reduced  clarity of speech   Cognition Arousal: Lethargic Behavior During Therapy: Flat affect Cognition: Cognition impaired   Orientation impairments: Time, Situation, Place   Memory impairment (select all impairments): Short-term memory Attention impairment (select first level of impairment): Focused attention Executive functioning impairment (select all impairments): Initiation, Problem solving                   Following commands: Impaired Following commands impaired: Follows one step commands inconsistently, Follows one step commands with increased time     Cueing  General Comments   Cueing Techniques: Verbal cues;Gestural cues;Tactile cues      Exercises     Shoulder Instructions      Home Living Family/patient expects to be discharged to:: Private residence Living Arrangements: Spouse/significant other Available Help at Discharge: Family;Available 24 hours/day Type of Home: House Home Access: Stairs to enter [employer] of Steps: 7 Entrance Stairs-Rails: Right;Left;Can reach both Home Layout: Multi-level Alternate Level Stairs-Number of Steps: Split level home with 7 stairs to main level and 7 stairs to basement level.  [employer]ift   Bathroom Shower/Tub: Producer, television/film/video: Standard Bathroom Accessibility: Yes How Accessible: Accessible via walker Home Equipment: Agricultural consultant (2 wheels);Cane - single point;Shower seat          Prior Functioning/Environment Prior Level of Function : Independent/Modified Independent               ADLs Comments: Mod I for bathing dressing.  Walks short distances, home O2, manages his own medications.  Does not assist with iADL, no longer drives.    OT Problem List: Decreased strength;Decreased range of motion;Decreased activity tolerance;Impaired balance (sitting and/or standing);Decreased safety awareness;Decreased cognition;Pain   OT Treatment/Interventions: Self-care/ADL training;Therapeutic  activities;Therapeutic exercise;Patient/family education;Balance training;DME and/or AE instruction      OT Goals(Current goals can be found in the care plan section)   Acute Rehab OT Goals OT Goal Formulation: Patient unable to participate in goal setting Time For Goal Achievement: 06/22/24 Potential to Achieve Goals: Fair ADL Goals Pt Will Perform Grooming: with supervision;sitting Pt Will Perform Upper Body Dressing: with supervision;sitting Pt Will Perform Lower Body Dressing: with min assist;sit to/from stand Pt Will Transfer to Toilet: with min assist;bedside commode;stand pivot transfer Pt/caregiver will Perform Home Exercise Program: Increased strength;Both right and left upper extremity;With theraband;With Supervision   OT Frequency:  Min 2X/week    Co-evaluation              AM-PAC OT 6 Clicks Daily Activity     Outcome Measure Help from another person eating meals?: A Little Help from another person taking care of personal grooming?: A Little Help from another person toileting, which includes using toliet, bedpan, or urinal?: A Lot Help from another person bathing (including washing, rinsing, drying)?: A Lot Help from another person to put on and taking off regular upper body clothing?: A Lot Help from another person to put on and taking off regular lower body clothing?: A Lot 6 Click Score: 14   End of Session Equipment Utilized During Treatment: Gait belt Nurse Communication: Mobility status  Activity Tolerance: Patient limited by lethargy Patient left: in chair;with call bell/phone within reach;with chair alarm set;with family/visitor present;with restraints reapplied  OT Visit Diagnosis: Unsteadiness on feet (  R26.81);Muscle weakness (generalized) (M62.81);Pain Pain - Right/Left: Right Pain - part of body: Knee                Time: 1338-1410 OT Time Calculation (min): 32 min Charges:  OT General Charges $OT Visit: 1 Visit OT Evaluation $OT Eval  Moderate Complexity: 1 Mod  06/08/2024  RP, OTR/L  Acute Rehabilitation Services  Office:  [phone]   Charlie JONETTA Halsted 06/08/2024, 2:48 PM

## 2024-06-08 NOTE — Progress Notes (Signed)
 Patient exhibiting signs of agitation: restlessness, fidgeting, reaching and pulling at Cortrax Tube, increase irritability, and speaking aggressively to his wife and nursing staff. Attempted de-escalating techniques with patient. Patient is unable to be redirected. PRN Haldol administered.

## 2024-06-08 NOTE — Evaluation (Signed)
 Physical Therapy Evaluation Patient Details Name: Martin Ford MRN: [id number] DOB: 20-Nov-1935 Today's Date: 06/08/2024  History of Present Illness  The pt is an 88 yo male presenting to [facility] 9/29 for SOB and progressive AMS. Pt intubated 9/29 and transferred to [facility] 9/30, extubated 10/3. DVT found 10/3. PMH includes: COPD stage 3 chronically on 2L O2, HTN, HLD, hypothyroidism, PVD, and essential tremor.   Clinical Impression  Pt in bed upon arrival of PT, agreeable to evaluation at this time. Prior to admission the pt was ambulating independently with use of RW in the home and cane in community, living with his wife in a split-level home with a chair lift for inside stairs. The pt required increased processing time for all mobility, as well as mod-maxA to complete bed mobility and sit-stand transfers. Pt with significant difficulty managing dynamic balance with lateral stepping, needing assist to maintain hips in extension as well as wt shifting for advancing steps. PT also with no active R ankle DF, inversion, or eversion which he reports he has not noticed before. Given level of assist needed for all mobility, recommend d/c to inpatient therapies <3hours/day when medically stable for d/c.      If plan is discharge home, recommend the following: Two people to help with walking and/or transfers;Two people to help with bathing/dressing/bathroom;Assistance with cooking/housework;Assistance with feeding;Direct supervision/assist for medications management;Direct supervision/assist for financial management;Assist for transportation;Help with stairs or ramp for entrance;Supervision due to cognitive status   Can travel by private vehicle   No    Equipment Recommendations Wheelchair (measurements PT);Wheelchair cushion (measurements PT)  Recommendations for Other Services       Functional Status Assessment Patient has had a recent decline in their functional status and demonstrates the ability  to make significant improvements in function in a reasonable and predictable amount of time.     Precautions / Restrictions Precautions Precautions: Fall Recall of Precautions/Restrictions: Impaired Precaution/Restrictions Comments: B mitts Restrictions Weight Bearing Restrictions Per Provider Order: No      Mobility  Bed Mobility Overal bed mobility: Needs Assistance Bed Mobility: Sidelying to Sit   Sidelying to sit: Max assist       General bed mobility comments: assist to LE and trunk to complete movement to EOB    Transfers Overall transfer level: Needs assistance Equipment used: 2 person hand held assist Transfers: Bed to chair/wheelchair/BSC, Sit to/from Stand Sit to Stand: Mod assist, +2 physical assistance   Step pivot transfers: Mod assist, +2 physical assistance, Max assist       General transfer comment: modA to rise and steady, cues to extend hips and for upright trunk posture. no overt buckling noted. maxA to faciliate lateral steps with assist to wt shift and maxA to maintain upright    Ambulation/Gait               General Gait Details: unable to manage more than lateral steps  from bed-chair. maxA to facilitate wt shift and stepping, poor advancement of LLE    Balance Overall balance assessment: Needs assistance Sitting-balance support: Feet supported Sitting balance-Leahy Scale: Fair     Standing balance support: Bilateral upper extremity supported Standing balance-Leahy Scale: Poor Standing balance comment: dependent on modA of 2 statically, maxA of 2 dynamically                             Pertinent Vitals/Pain Pain Assessment Pain Assessment: Faces Faces Pain Scale: Hurts a  little bit Pain Location: generalized with activity Pain Descriptors / Indicators: Aching Pain Intervention(s): Limited activity within patient's tolerance, Monitored during session, Repositioned    Home Living Family/patient expects to be  discharged to:: Private residence Living Arrangements: Spouse/significant other Available Help at Discharge: Family;Available 24 hours/day Type of Home: House Home Access: Stairs to enter Entrance Stairs-Rails: Right;Left;Can reach both Entrance Stairs-Number of Steps: 7 Alternate Level Stairs-Number of Steps: Split level home with 7 stairs to main level and 7 stairs to basement level.  [employer] Home Layout: Multi-level Home Equipment: Agricultural consultant (2 wheels);Cane - single point;Shower seat      Prior Function Prior Level of Function : Independent/Modified Independent             Mobility Comments: use of RW in the home and cane in community, reports no falls. spends most of his day walking in the home and working in garage. uses stair lift to manage steps in the home ADLs Comments: Mod I for bathing dressing.  Walks short distances, home O2, manages his own medications.  Does not assist with iADL, no longer drives.     Extremity/Trunk Assessment   Upper Extremity Assessment Upper Extremity Assessment: Defer to OT evaluation;Generalized weakness    Lower Extremity Assessment Lower Extremity Assessment: RLE deficits/detail;Generalized weakness RLE Deficits / Details: pt with no active movement for R ankle DF, was able to perform PF with grossly 4-/5 strength. grossly 3+/5 against MMT at knee and hip. reports sensation intact RLE Sensation: WNL RLE Coordination: decreased gross motor;decreased fine motor    Cervical / Trunk Assessment Cervical / Trunk Assessment: Kyphotic  Communication   Communication Communication: Impaired Factors Affecting Communication: Hearing impaired;Reduced clarity of speech    Cognition Arousal: Lethargic Behavior During Therapy: Flat affect   PT - Cognitive impairments: Orientation, Awareness, Memory, Attention, Initiation, Sequencing, Problem solving, Safety/Judgement   Orientation impairments: Time, Situation                    PT - Cognition Comments: pt needing max cues and increased time to follow commands for mobility, able to follow simple commands for MMT. limited verbalizations in session Following commands: Impaired Following commands impaired: Follows one step commands inconsistently, Follows one step commands with increased time     Cueing Cueing Techniques: Verbal cues, Gestural cues, Tactile cues     General Comments General comments (skin integrity, edema, etc.): VSS on 2L, BP elevated throughout    Exercises     Assessment/Plan    PT Assessment Patient needs continued PT services  PT Problem List Decreased strength;Decreased range of motion;Decreased activity tolerance;Decreased balance;Decreased mobility;Decreased coordination;Decreased safety awareness       PT Treatment Interventions DME instruction;Gait training;Stair training;Functional mobility training;Therapeutic activities;Therapeutic exercise;Balance training;Neuromuscular re-education;Patient/family education    PT Goals (Current goals can be found in the Care Plan section)  Acute Rehab PT Goals Patient Stated Goal: none stated PT Goal Formulation: With patient Time For Goal Achievement: 06/22/24 Potential to Achieve Goals: Fair    Frequency Min 2X/week     Co-evaluation   Reason for Co-Treatment: For patient/therapist safety;Necessary to address cognition/behavior during functional activity;To address functional/ADL transfers PT goals addressed during session: Mobility/safety with mobility;Balance;Strengthening/ROM         AM-PAC PT 6 Clicks Mobility  Outcome Measure Help needed turning from your back to your side while in a flat bed without using bedrails?: A Lot Help needed moving from lying on your back to sitting on the side of a  flat bed without using bedrails?: A Lot Help needed moving to and from a bed to a chair (including a wheelchair)?: Total Help needed standing up from a chair using your arms (e.g.,  wheelchair or bedside chair)?: Total Help needed to walk in hospital room?: Total Help needed climbing 3-5 steps with a railing? : Total 6 Click Score: 8    End of Session Equipment Utilized During Treatment: Gait belt;Oxygen  Activity Tolerance: Patient tolerated treatment well;Patient limited by fatigue Patient left: in chair;with call bell/phone within reach;with chair alarm set;with family/visitor present Nurse Communication: Mobility status;Need for lift equipment (stedy) PT Visit Diagnosis: Unsteadiness on feet (R26.81);Other abnormalities of gait and mobility (R26.89);Muscle weakness (generalized) (M62.81)    Time: 8647-8586 PT Time Calculation (min) (ACUTE ONLY): 21 min   Charges:   PT Evaluation $PT Eval Low Complexity: 1 Low   PT General Charges $$ ACUTE PT VISIT: 1 Visit         Izetta Call, PT, DPT   Acute Rehabilitation Department Office [phone] Secure Chat Communication Preferred  Izetta JULIANNA Call 06/08/2024, 3:42 PM

## 2024-06-08 NOTE — Progress Notes (Signed)
 Speech Language Pathology Treatment: Dysphagia  Patient Details Name: Martin Ford MRN: [id number] DOB: [birth year] Today's Date: 06/08/2024 Time: 8852-8797 SLP Time Calculation (min) (ACUTE ONLY): 15 min  Assessment / Plan / Recommendation Clinical Impression  Martin Ford presents with decreased alertness (more lethargic compared to yesterday per spouse) and increased WOB. While he responded verbally and followed commands, he did not open his eyes throughout the visit. Spouse reports she has provided sips of water via straw without noticing coughing, though prolonged, congested coughing followed straw sips with this SLP. This occurred regardless of the volume provided, following tspn sips and controlled cup sips as well. No coughing followed small bites of puree, though question his ability to consistently take meds in puree given his mentation. Discussed that he is not currently alert enough for a diet but can continue to have small, controlled sips of water via tspn only after oral care. Otherwise, recommend he remain NPO with consideration of short-term enteral nutrition. SLP will continue following.    HPI HPI: Martin Ford is a 88 y.o. male who presented with worsening dyspnea despite increase in O2 from baseline 2L to 4L. He was taken to [facility] ED where he required intubation. CTA chest demonstrated Bronchiectasis, no PE. CT head demonstrated no acute issues, generalized atrophy and sinus thickening. ETT 9/29- 10/3 (5 days). Failed [facility] Swallow Screen 10/4. PMH: COPD Stage 3 by GOLD (PFT's Dec 2022 with FEV1 0.95 (38%), ratio 0.48) and chronically on 2L O2.      SLP Plan  Continue with current plan of care          Recommendations  Diet recommendations: NPO Medication Administration: Crushed with puree                  Oral care QID;Oral care prior to ice chip/H20   Frequent or constant Supervision/Assistance Dysphagia, unspecified (R13.10)     Continue with current plan of care     Damien Blumenthal, M.A., CCC-SLP Speech Language Pathology, Acute Rehabilitation Services  Secure Chat preferred [phone]   06/08/2024, 12:26 PM

## 2024-06-08 NOTE — Progress Notes (Signed)
 [facility] Progress Note Patient: Martin Ford FMW:[id number] DOB: [birth year]  DOA: 06/01/2024 DOS: the patient was seen and examined on 06/08/2024  Brief Hospital Course: Patient with PMH of COPD, GERD, chronic respiratory failure on 2 LPM, aortic stenosis presented to the hospital with complaints of confusion and shortness of breath. He sees Dr. Darlean and just saw him as an outpatient on 05/14/24 and was prescribed a prednisone  taper and 10 day course of Doxycycline .   On 9/29, he had worsening dyspnea despite increase in O2 from baseline 2L to 4L. He was taken to [facility] ED where he required intubation. He was given 1 dose of Vancomycin and Cefepime empirically.   CTA chest demonstrated Bronchiectasis, no PE. CT head demonstrated no acute issues, generalized atrophy and sinus thickening.   He was later transferred to [facility] for further management.  Assessment and Plan: Acute on chronic hypoxic/hypercapnic respiratory failure 2/2 AECOPD - COPD Required intubation on 9/29. Uses 2 LPM chronically at home. Currently on 2 LPM. Was on BiPAP. Currently BiPAP is ordered although not indicated. Will change BiPAP to as needed. Patient appears to be having very poor cough. Continue chest PT. Continue BiPAP as needed. Resume IV Unasyn for now. Increase Solu-Medrol.   AKI: - Creatinine went to 2.24.  Post diuresis. Renal function improving.  normal values.  Hypernatremia. From poor p.o. intake. Initiating free water and D10. Monitor BMP every 4 hours.   Mitral valve prolapse. Moderate aortic stenosis. Moderate aortic regurgitation. ICU discussed with cardiologist Dr  Ren Maha.  recs to follow as an outpatient   Hyperglycemia - presumed 2/2 steroids> now hypoglycemic.  For now we will continue D10.   Moderate protein calorie malnutrition:  poor swallow. Speech evaluation.  Currently NPO.   B/L DVT: Acute.  Distal bilateral. initially started on eliquis but now on Lovenox  therapeutic dose.   Diarrhea: Likely associated with tube feeding.  May need fiber supplements. Currently no GI symptoms. No further workup.  Delirium. Patient is somewhat confused and agitated. Required Precedex in the ICU. Unchanged delirium compared to evaluation on 10/5. Will monitor closely.  Continue Seroquel.  Goals of care conversation. Discussed with wife at bedside. Patient has a living will that states that if he is not evaluated to state he would like to be resuscitated. But in a vegetative state he would not like to be kept alive artificially in machine. Monitor.  Moderate protein calorie malnutrition Body mass index is 20.82 kg/m.  Continue supplement. Renders poor prognosis for the patient.  Hypertension. Blood pressure elevated. As needed hydralazine.   Subjective: No nausea no vomiting no fever no chills.  Somewhat confused.  Reported severe abdominal pain.  Reported wanted to go to the bathroom.  Physical Exam: Bilateral upper airway crackles. S1-S2 present Bowel sound present Diffusely tender. No edema. Alert and awake, not oriented.  Able to follow commands.  No focal deficit.  Data Reviewed: I have Reviewed nursing notes, Vitals, and Lab results. Since last encounter, pertinent lab results CBC and BMP   . I have ordered test including CBC and BMP  . I have ordered imaging x-ray abdomen  .   Disposition: Status is: Inpatient Remains inpatient appropriate because: Monitor for clarity on goals of care and improvement in mentation.  SCDs Start: 06/02/24 0135   Family Communication: Family at bedside Level of care: Progressive   Vitals:   06/08/24 1211 06/08/24 1400 06/08/24 1620 06/08/24 1647  BP:  (!) 183/86 (!) 155/47 (!) 165/76  Pulse:  68 81 74 88  Resp: (!) 29 (!) 26 (!) 28   Temp:  98.1 F (36.7 C) 98.2 F (36.8 C)   TempSrc:  Axillary Axillary   SpO2: 100% 100% 99% 97%  Weight:      Height:         Author: Yetta Blanch,  MD 06/08/2024 7:40 PM  Please look on [email or web address] to find out who is on call.

## 2024-06-09 ENCOUNTER — Inpatient Hospital Stay (HOSPITAL_COMMUNITY)

## 2024-06-09 DIAGNOSIS — J9601 Acute respiratory failure with hypoxia: Secondary | ICD-10-CM

## 2024-06-09 DIAGNOSIS — Z7189 Other specified counseling: Secondary | ICD-10-CM

## 2024-06-09 DIAGNOSIS — J449 Chronic obstructive pulmonary disease, unspecified: Secondary | ICD-10-CM | POA: Diagnosis not present

## 2024-06-09 DIAGNOSIS — Z515 Encounter for palliative care: Secondary | ICD-10-CM

## 2024-06-09 LAB — BASIC METABOLIC PANEL WITH GFR
Anion gap: 11 (ref 5–15)
Anion gap: 12 (ref 5–15)
Anion gap: 13 (ref 5–15)
BUN: 42 mg/dL — ABNORMAL HIGH (ref 8–23)
BUN: 43 mg/dL — ABNORMAL HIGH (ref 8–23)
BUN: 44 mg/dL — ABNORMAL HIGH (ref 8–23)
CO2: 28 mmol/L (ref 22–32)
CO2: 30 mmol/L (ref 22–32)
CO2: 32 mmol/L (ref 22–32)
Calcium: 8.6 mg/dL — ABNORMAL LOW (ref 8.9–10.3)
Calcium: 8.6 mg/dL — ABNORMAL LOW (ref 8.9–10.3)
Calcium: 8.6 mg/dL — ABNORMAL LOW (ref 8.9–10.3)
Chloride: 104 mmol/L (ref 98–111)
Chloride: 104 mmol/L (ref 98–111)
Chloride: 106 mmol/L (ref 98–111)
Creatinine, Ser: 0.77 mg/dL (ref 0.61–1.24)
Creatinine, Ser: 0.79 mg/dL (ref 0.61–1.24)
Creatinine, Ser: 0.89 mg/dL (ref 0.61–1.24)
GFR, Estimated: >60 mL/min (ref >60)
GFR, Estimated: >60 mL/min (ref >60)
GFR, Estimated: >60 mL/min (ref >60)
Glucose, Bld: 156 mg/dL — ABNORMAL HIGH (ref 70–99)
Glucose, Bld: 170 mg/dL — ABNORMAL HIGH (ref 70–99)
Glucose, Bld: 197 mg/dL — ABNORMAL HIGH (ref 70–99)
Potassium: 3.5 mmol/L (ref 3.5–5.1)
Potassium: 3.7 mmol/L (ref 3.5–5.1)
Potassium: 3.8 mmol/L (ref 3.5–5.1)
Sodium: 145 mmol/L (ref 135–145)
Sodium: 147 mmol/L — ABNORMAL HIGH (ref 135–145)
Sodium: 148 mmol/L — ABNORMAL HIGH (ref 135–145)

## 2024-06-09 LAB — MAGNESIUM: Magnesium: 2.1 mg/dL (ref 1.7–2.4)

## 2024-06-09 LAB — CBC
HCT: 36.9 % — ABNORMAL LOW (ref 39.0–52.0)
Hemoglobin: 12 g/dL — ABNORMAL LOW (ref 13.0–17.0)
MCH: 31.7 pg (ref 26.0–34.0)
MCHC: 32.5 g/dL (ref 30.0–36.0)
MCV: 97.6 fL (ref 80.0–100.0)
Platelets: 282 K/uL (ref 150–400)
RBC: 3.78 MIL/uL — ABNORMAL LOW (ref 4.22–5.81)
RDW: 13.2 % (ref 11.5–15.5)
WBC: 9.4 K/uL (ref 4.0–10.5)
nRBC: 0 % (ref 0.0–0.2)

## 2024-06-09 LAB — GLUCOSE, CAPILLARY
Glucose-Capillary: 142 mg/dL — ABNORMAL HIGH (ref 70–99)
Glucose-Capillary: 169 mg/dL — ABNORMAL HIGH (ref 70–99)
Glucose-Capillary: 190 mg/dL — ABNORMAL HIGH (ref 70–99)
Glucose-Capillary: 143 mg/dL — ABNORMAL HIGH (ref 70–99)
Glucose-Capillary: 147 mg/dL — ABNORMAL HIGH (ref 70–99)
Glucose-Capillary: 153 mg/dL — ABNORMAL HIGH (ref 70–99)
Glucose-Capillary: 147 mg/dL — ABNORMAL HIGH (ref 70–99)

## 2024-06-09 LAB — PHOSPHORUS: Phosphorus: 2.3 mg/dL — ABNORMAL LOW (ref 2.5–4.6)

## 2024-06-09 MED ORDER — FAMOTIDINE 40 MG/5ML PO SUSR
20.0000 mg | Freq: Every day | ORAL | Status: DC
  Administered 2024-06-09 – 2024-06-20 (×12): 20 mg
  Filled 2024-06-09 (×12): qty 2.5

## 2024-06-09 MED ORDER — GUAIFENESIN 200 MG PO TABS
400.0000 mg | ORAL_TABLET | Freq: Three times a day (TID) | ORAL | Status: DC
  Administered 2024-06-09 – 2024-06-20 (×33): 400 mg
  Filled 2024-06-09 (×35): qty 2

## 2024-06-09 MED ORDER — FREE WATER
150.0000 mL | Status: DC
  Administered 2024-06-09 – 2024-06-13 (×23): 150 mL

## 2024-06-09 NOTE — Progress Notes (Signed)
 Speech Language Pathology Treatment: Dysphagia  Patient Details Name: Martin Ford MRN: [id number] DOB: [birth year] Today's Date: 06/09/2024 Time: 8979-8971 SLP Time Calculation (min) (ACUTE ONLY): 8 min  Assessment / Plan / Recommendation Clinical Impression  Pt's level of alertness is consistent with yesterday, not opening his eyes unless given extensive cueing. Multiple swallows and wet vocal quality follow spoon sips of water. Session was limited today after pt consistently declined water beyond 2-3 tspns, ice chips, and applesauce. He is not demonstrating interest in POs nor is he consistently alert enough to participate in an instrumental study. Recommend he remain NPO with Cortrak in place for medication administration. SLP will continue following as his alertness and participation allow.    HPI HPI: Pt is a 88 y.o. male who presented with worsening dyspnea despite increase in O2 from baseline 2L to 4L. He was taken to [facility] ED where he required intubation. CTA chest demonstrated Bronchiectasis, no PE. CT head demonstrated no acute issues, generalized atrophy and sinus thickening. ETT 9/29- 10/3 (5 days). Failed [facility] Swallow Screen 10/4. PMH: COPD Stage 3 by GOLD (PFT's Dec 2022 with FEV1 0.95 (38%), ratio 0.48) and chronically on 2L O2.      SLP Plan  Continue with current plan of care          Recommendations  Diet recommendations: NPO Medication Administration: Via alternative means                  Oral care QID;Oral care prior to ice chip/H20   Frequent or constant Supervision/Assistance Dysphagia, unspecified (R13.10)     Continue with current plan of care     Damien Blumenthal, M.A., CCC-SLP Speech Language Pathology, Acute Rehabilitation Services  Secure Chat preferred [phone]   06/09/2024, 12:06 PM

## 2024-06-09 NOTE — Progress Notes (Signed)
 [facility] Progress Note Patient: Martin Ford FMW:[id number] DOB: June 24, 1936  DOA: 06/01/2024 DOS: the patient was seen and examined on 06/09/2024  Brief Hospital Course: Patient with PMH of COPD, GERD, chronic respiratory failure on 2 LPM, aortic stenosis presented to the hospital with complaints of confusion and shortness of breath. He sees Dr. Darlean and just saw him as an outpatient on 05/14/24 and was prescribed a prednisone  taper and 10 day course of Doxycycline .   On 9/29, he had worsening dyspnea despite increase in O2 from baseline 2L to 4L. He was taken to [facility] ED where he required intubation. He was given 1 dose of Vancomycin and Cefepime empirically.   CTA chest demonstrated Bronchiectasis, no PE. CT head demonstrated no acute issues, generalized atrophy and sinus thickening.   He was later transferred to [facility] for further management.  Assessment and Plan: Acute on chronic hypoxic/hypercapnic respiratory failure 2/2 AECOPD - COPD Aspiration pneumonia. Required intubation on 9/29. Uses 2 LPM chronically at home. Currently on 2 LPM. Was on BiPAP after extubation. Patient appears to be having very poor cough. Chest PT was ordered.  Currently discontinued by respiratory therapist. Difficulty use BiPAP in the setting of Cortrak. Currently on IV Unasyn. Continue Solu-Medrol and nebulizer therapy.   AKI: Creatinine went to 2.24. Post diuresis. Renal function improving.  normal values.  Hypernatremia. From poor p.o. intake. Treated with free water and D10. Continue free water.  Mitral valve prolapse. Moderate aortic stenosis. Moderate aortic regurgitation. ICU discussed with cardiologist Dr  Ren Maha.  Recommended to follow as an outpatient This renders very poor prognosis for the patient's current presentation though.   Hyperglycemia/hypoglycemia Hyperglycemia presumed 2/2 steroids.  Later on become hypoglycemic as steroids are weaned and tube feeds are  stopped. Treated with D10.  Now on Cortrak.    Dysphagia. Poor p.o. intake. Secondary to patient's ongoing delirium patient is not a good candidate for swallow. SLP has been following but patient has not been able to pass swallow evaluation. Suspect prognosis is poor. There is also concern for aspiration pneumonia for which the patient is on antibiotics.   Moderate protein calorie malnutrition Dietitian consulting. Cortrak placed. Initiating tube feed.   Acute bilateral distal DVT. CTPE negative for PE. Lower extremity Doppler on 10/3 positive for bilateral distal DVT No significant edema. Initially was on Eliquis.  Now on Lovenox.   Diarrhea: Likely associated with tube feeding.  May need fiber supplements. Currently no GI symptoms. No further workup.  Delirium. Patient is somewhat confused and agitated. Required Precedex in the ICU. Unchanged delirium compared to evaluation on 10/5. Will monitor closely.  Continue Seroquel.  Goals of care conversation. Discussed with wife at bedside. Patient has a living will that states that if he is not evaluated to state he would like to be resuscitated. But in a vegetative state he would not like to be kept alive artificially in machine. Palliative care consulted.  Patient's prognosis appears to be poor. Monitor.  Moderate protein calorie malnutrition Body mass index is 19.34 kg/m.  Continue supplement. Renders poor prognosis for the patient.  Hypertension. Blood pressure elevated. As needed hydralazine.   Subjective: No nausea no vomiting no fever no chills.  Remains confused.  Has shortness of breath.  Intermittently agitated.  Physical Exam: Upper airway crackles. Expiratory wheezing. Bowel sound present S1-S2 present. No edema. Alert.  Oriented to self only.  Data Reviewed: I have Reviewed nursing notes, Vitals, and Lab results. Since last encounter, pertinent lab results CBC and  BMP   . I have ordered test  including CBC and BMP  . I have discussed pt's care plan and test results with chest x-ray  .   Disposition: Status is: Inpatient Remains inpatient appropriate because: Requires improvement in oral intake.  SCDs Start: 06/02/24 0135   Family Communication: No one at bedside Level of care: Progressive   Vitals:   06/09/24 0742 06/09/24 0806 06/09/24 1211 06/09/24 1715  BP: (!) 145/80  (!) 159/73 (!) 120/52  Pulse:  82    Resp: 19 (!) 26 (!) 21 20  Temp: 97.8 F (36.6 C)  98.2 F (36.8 C) 98 F (36.7 C)  TempSrc: Axillary  Axillary Axillary  SpO2: 100% 100%  100%  Weight:      Height:         Author: Yetta Blanch, MD 06/09/2024 5:56 PM  Please look on [email or web address] to find out who is on call.

## 2024-06-09 NOTE — TOC Progression Note (Addendum)
 Transition of Care [facility]) - Progression Note    Patient Details  Name: Martin Ford MRN: [id number] Date of Birth: August 14, 1936  Transition of Care [facility]) CM/SW Contact  Isaiah Public, LCSWA Phone Number: 06/09/2024, 11:10 AM  Clinical Narrative:     CSW received consult for possible SNF placement at time of discharge. Due to patients current orientation CSW spoke with [name] regarding PT recommendation of SNF placement at time of discharge. Patients spouse reports patient lives at home with her. Patients spouse expressed understanding of PT recommendation and is agreeable to SNF placement for patient at time of discharge. Patients spouse gave CSW permission to fax out initial referral for SNF placement for patient. CSW discussed insurance authorization process. CSW following to fax patient out for SNF closer to patient being medically ready for dc. Patient currently has cortrak. All questions answered. No further questions reported at this time. CSW to continue to follow and assist with discharge planning needs.    Expected Discharge Plan: Skilled Nursing Facility Barriers to Discharge: Continued Medical Work up               Expected Discharge Plan and Services In-house Referral: Clinical Social Work     Living arrangements for the past 2 months: Single Family Home                                       Social Drivers of Health (SDOH) Interventions SDOH Screenings   Food Insecurity: No Food Insecurity (06/01/2022)  Housing: Low Risk  (06/06/2024)  Transportation Needs: No Transportation Needs (06/06/2024)  Utilities: Patient Unable To Answer (06/03/2024)  Alcohol Screen: Low Risk  (06/01/2022)  Depression (PHQ2-9): Low Risk  (02/21/2024)  Financial Resource Strain: Low Risk  (06/01/2022)  Physical Activity: Inactive (06/01/2022)  Social Connections: Unknown (06/01/2022)  Stress: No Stress Concern Present (06/01/2022)  Tobacco Use: Medium Risk (06/02/2024)     Readmission Risk Interventions     No data to display

## 2024-06-09 NOTE — Progress Notes (Signed)
   06/09/24 0806  Assess: MEWS Score  Pulse Rate 82  Resp (!) 26  O2 Device (S)  Room Air  Assess: MEWS Score  MEWS Temp 0  MEWS Systolic 0  MEWS Pulse 0  MEWS RR 2  MEWS LOC 0  MEWS Score 2  MEWS Score Color Yellow  Assess: if the MEWS score is Yellow or Red  Were vital signs accurate and taken at a resting state? Yes  Does the patient meet 2 or more of the SIRS criteria? No  MEWS guidelines implemented  Yes, yellow  Treat  MEWS Interventions Considered administering scheduled or prn medications/treatments as ordered  Take Vital Signs  Increase Vital Sign Frequency  Yellow: Q2hr x1, continue Q4hrs until patient remains green for 12hrs  Escalate  MEWS: Escalate Yellow: Discuss with charge nurse and consider notifying provider and/or RRT  Notify: Charge Nurse/RN  Name of Charge Nurse/RN Notified Yoko  Provider Notification  Provider Name/Title Dr Tobie  Time Provider Notified [phone]  Method of Notification Page  Notification Reason  (respirations 26)  Provider response Evaluate remotely  Date of Provider Response 06/09/24  Time of Provider Response [phone]  Assess: SIRS CRITERIA  SIRS Temperature  0  SIRS Respirations  1  SIRS Pulse 0  SIRS WBC 0  SIRS Score Sum  1   MD aware

## 2024-06-09 NOTE — Consult Note (Signed)
 Palliative Care Consult Note                                  Date: 06/09/2024   Patient Name: Martin Ford  DOB: 1936-07-12  MRN: [id number]  Age / Sex: 88 y.o., male  PCP: Tobie Suzzane POUR, MD Referring Physician: Tobie Yetta HERO, MD  Reason for Consultation: Establishing goals of care  HPI/Patient Profile: 88 y.o. male  with past medical history of COPD, chronic respiratory failure on home O2, aortic stenosis, and GERD who presented to the ED on 06/01/2024 with complaint of shortness of breath and confusion.  He was admitted with acute on chronic hypoxic/hypercapnic respiratory failure secondary to AECOPD. He required intubation 9/29 and was extubated 10/3.  Hospitalization has been complicated by AKI, hypernatremia, dysphagia, malnutrition, and delirium. He has remained NPO due to decreased alertness and had cortrak placed on 10/6.  Palliative Medicine has been consulted for goals of care discussions. and complex medical decision making.   Clinical Assessment and Goals of Care:   Extensive chart review has been completed including labs, vital signs, imaging, progress/consult notes, orders, medications and available advance directive documents.    Patient assessed.  He is alert and confused; oriented to self only.  Bilateral mitts in place.  I spoke with his wife/[name] by phone to discuss diagnosis, prognosis, GOC, disposition, and options.  I introduced Palliative Medicine as specialized medical care for people living with serious illness. It focuses on providing relief from the symptoms and stress of a serious illness.   Created space and opportunity for wife to express thoughts and feelings regarding current medical situation. Values and goals of care were attempted to be elicited.  Social History: Patient and wife have been married for 40 years. They have 2 sons and 1 granddaughter.  They relocated from [place]  to [place]  and  eventually to [place] .  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  Functional Status: Patient lives at home with his wife.  Prior to admission, he was ambulatory and independent with basic ADLs.  Discussion: We discussed patient's current illness and what it means in the larger context of his ongoing co-morbidities. We reviewed patient's hospital course and current clinical condition.  We discussed that patient is hospitalized with acute on chronic respiratory failure due to severe COPD exacerbation.  We discussed that his hospitalization has been complicated by acute kidney injury, delirium, poor swallowing and malnutrition. I shared my concern that given his multiple comorbidities, patient has less reserve to recover from this acute illness.    Discussed the what ifs and encouraged wife to consider what medical interventions patient would want in the event his condition were to deteriorate, keeping in mind the concept of quality of life.  We discussed the best case and worst-case scenarios.   Discussed code status. I encouraged consideration of DNR/DNI status and provided education on evidence-based poor outcomes in similar hospitalized patients, as the cause of cardiac arrest would likely be associated with advanced illness rather than a reversible condition.  Wife states that patient's medical situation does not look promising, and acknowledges she may have to make some difficult decisions if his condition does not improve. She shares that patient has a living will stating he wants to be resuscitated, however would not want to be kept alive on machines if he were incapacitated or in  a vegetative state.   Discussed the importance of continued conversation with the medical team regarding overall plan of care and treatment options.  Questions and concerns addressed.  Emotional support provided.   Review of  Systems  Unable to perform ROS   Objective:   Primary Diagnoses: Present on Admission:  COPD (chronic obstructive pulmonary disease) (HCC)  Acute and chronic respiratory failure (acute-on-chronic) (HCC)   Physical Exam Vitals reviewed.  Constitutional:      General: He is not in acute distress.    Appearance: He is ill-appearing.  HENT:     Head:     Comments: Cortrak in place Cardiovascular:     Rate and Rhythm: Normal rate.  Pulmonary:     Effort: Tachypnea present.  Neurological:     Mental Status: He is confused.     Comments: Oriented to self only    Palliative Assessment/Data: PPS 30%     Assessment & Plan:   SUMMARY OF RECOMMENDATIONS   Full code Continue full scope care Ongoing palliative support  Primary Decision Maker: NEXT OF KIN - spouse/[name]  Existing Vynca/ACP Documentation: None  Symptom Management:  Per attending  Prognosis:  Unable to determine  Discharge Planning:  To Be Determined    Thank you for allowing us  to participate in the care of Issaac Shipper   Time Total: 76 minutes  Detailed review of medical records (labs, imaging, vital signs), medically appropriate exam, discussed with treatment team, counseling and education to patient, family, & staff, documenting clinical information, coordination of care.   Signed by: Recardo Loll, NP Palliative Medicine Team  Team Phone # [phone]  For individual providers, please see AMION

## 2024-06-10 DIAGNOSIS — E440 Moderate protein-calorie malnutrition: Secondary | ICD-10-CM | POA: Diagnosis not present

## 2024-06-10 DIAGNOSIS — J449 Chronic obstructive pulmonary disease, unspecified: Secondary | ICD-10-CM | POA: Diagnosis not present

## 2024-06-10 DIAGNOSIS — J9621 Acute and chronic respiratory failure with hypoxia: Secondary | ICD-10-CM | POA: Diagnosis not present

## 2024-06-10 LAB — COMPREHENSIVE METABOLIC PANEL WITH GFR
ALT: 28 U/L (ref 0–44)
AST: 19 U/L (ref 15–41)
Albumin: 2.8 g/dL — ABNORMAL LOW (ref 3.5–5.0)
Alkaline Phosphatase: 55 U/L (ref 38–126)
Anion gap: 9 (ref 5–15)
BUN: 40 mg/dL — ABNORMAL HIGH (ref 8–23)
CO2: 30 mmol/L (ref 22–32)
Calcium: 8.5 mg/dL — ABNORMAL LOW (ref 8.9–10.3)
Chloride: 104 mmol/L (ref 98–111)
Creatinine, Ser: 0.83 mg/dL (ref 0.61–1.24)
GFR, Estimated: >60 mL/min (ref >60)
Glucose, Bld: 213 mg/dL — ABNORMAL HIGH (ref 70–99)
Potassium: 3.4 mmol/L — ABNORMAL LOW (ref 3.5–5.1)
Sodium: 143 mmol/L (ref 135–145)
Total Bilirubin: 1.2 mg/dL (ref 0.0–1.2)
Total Protein: 5.9 g/dL — ABNORMAL LOW (ref 6.5–8.1)

## 2024-06-10 LAB — CBC
HCT: 38.1 % — ABNORMAL LOW (ref 39.0–52.0)
Hemoglobin: 12.2 g/dL — ABNORMAL LOW (ref 13.0–17.0)
MCH: 31.1 pg (ref 26.0–34.0)
MCHC: 32 g/dL (ref 30.0–36.0)
MCV: 97.2 fL (ref 80.0–100.0)
Platelets: 320 K/uL (ref 150–400)
RBC: 3.92 MIL/uL — ABNORMAL LOW (ref 4.22–5.81)
RDW: 13.2 % (ref 11.5–15.5)
WBC: 10.4 K/uL (ref 4.0–10.5)
nRBC: 0 % (ref 0.0–0.2)

## 2024-06-10 LAB — GLUCOSE, CAPILLARY
Glucose-Capillary: 168 mg/dL — ABNORMAL HIGH (ref 70–99)
Glucose-Capillary: 189 mg/dL — ABNORMAL HIGH (ref 70–99)
Glucose-Capillary: 148 mg/dL — ABNORMAL HIGH (ref 70–99)
Glucose-Capillary: 188 mg/dL — ABNORMAL HIGH (ref 70–99)
Glucose-Capillary: 201 mg/dL — ABNORMAL HIGH (ref 70–99)
Glucose-Capillary: 162 mg/dL — ABNORMAL HIGH (ref 70–99)

## 2024-06-10 LAB — TROPONIN I (HIGH SENSITIVITY)
Troponin I (High Sensitivity): 105 ng/L
Troponin I (High Sensitivity): 111 ng/L (ref <18)
Troponin I (High Sensitivity): 101 ng/L (ref <18)

## 2024-06-10 LAB — TSH: TSH: 1.385 u[IU]/mL (ref 0.350–4.500)

## 2024-06-10 LAB — PHOSPHORUS: Phosphorus: 2.7 mg/dL (ref 2.5–4.6)

## 2024-06-10 LAB — MAGNESIUM: Magnesium: 2.1 mg/dL (ref 1.7–2.4)

## 2024-06-10 MED ORDER — FUROSEMIDE 10 MG/ML IJ SOLN
20.0000 mg | Freq: Once | INTRAMUSCULAR | Status: DC
Start: 2024-06-10 — End: 2024-06-10

## 2024-06-10 MED ORDER — POTASSIUM CHLORIDE 20 MEQ PO PACK
40.0000 meq | PACK | Freq: Once | ORAL | Status: AC
  Administered 2024-06-10: 40 meq
  Filled 2024-06-10: qty 2

## 2024-06-10 MED ORDER — FUROSEMIDE 10 MG/ML IJ SOLN
40.0000 mg | Freq: Once | INTRAMUSCULAR | Status: AC
Start: 2024-06-10 — End: 2024-06-10
  Administered 2024-06-10: 40 mg via INTRAVENOUS
  Filled 2024-06-10 (×2): qty 4

## 2024-06-10 NOTE — Progress Notes (Signed)
 [facility]  PROGRESS NOTE  Martin Ford FMW:[id number] DOB: [birth year] DOA: 06/01/2024 PCP: Tobie Suzzane POUR, MD   Brief HPI:    Patient with PMH of COPD, GERD, chronic respiratory failure on 2 LPM, aortic stenosis presented to the hospital with complaints of confusion and shortness of breath. He sees Dr. Darlean and just saw him as an outpatient on 05/14/24 and was prescribed a prednisone  taper and 10 day course of Doxycycline .   On 9/29, he had worsening dyspnea despite increase in O2 from baseline 2L to 4L. He was taken to [facility] ED where he required intubation. He was given 1 dose of Vancomycin and Cefepime empirically.   CTA chest demonstrated Bronchiectasis, no PE. CT head demonstrated no acute issues, generalized atrophy and sinus thickening.   He was later transferred to [facility] for further management.   Assessment/Plan:   Acute on chronic hypoxic/hypercapnic respiratory failure 2/2 AECOPD - COPD Aspiration pneumonia. Required intubation on 9/29. Uses 2 LPM chronically at home. Currently on 2 LPM. Was on BiPAP after extubation. Patient appears to be having very poor cough. Chest PT was ordered.  Currently discontinued by respiratory therapist. Difficulty use BiPAP in the setting of Cortrak. Currently on IV Unasyn. Continue Solu-Medrol and nebulizer therapy.   AKI: Creatinine went to 2.24. Post diuresis. Renal function improving.  normal values.   Hypernatremia. From poor p.o. intake. Treated with free water and D10. Continue free water.   Mitral valve prolapse. Moderate aortic stenosis. Moderate aortic regurgitation. ICU discussed with cardiologist Dr  Ren Maha.  Recommended to follow as an outpatient This renders very poor prognosis for the patient's current presentation though.   Hyperglycemia/hypoglycemia Hyperglycemia presumed 2/2 steroids.  Later on become hypoglycemic as steroids are weaned and tube feeds are stopped. Treated with D10.  Now on Cortrak.      Dysphagia. Poor p.o. intake. Secondary to patient's ongoing delirium patient is not a good candidate for swallow. SLP has been following but patient has not been able to pass swallow evaluation. Suspect prognosis is poor. There is also concern for aspiration pneumonia for which the patient is on antibiotics.   Moderate protein calorie malnutrition Dietitian consulting. Cortrak placed. Initiating tube feed.   Acute bilateral distal DVT. CTPE negative for PE. Lower extremity Doppler on 10/3 positive for bilateral distal DVT No significant edema. Initially was on Eliquis.  Now on Lovenox.   Diarrhea: Likely associated with tube feeding.  May need fiber supplements. Currently no GI symptoms. No further workup.   Delirium. Patient is somewhat confused and agitated. Required Precedex in the ICU. Unchanged delirium compared to evaluation on 10/5. Will monitor closely.  Continue Seroquel.   Goals of care conversation. Discussed with wife at bedside. Patient has a living will that states that if he is not evaluated to state he would like to be resuscitated. But in a vegetative state he would not like to be kept alive artificially in machine. Palliative care consulted.  Patient's prognosis appears to be poor. Monitor.   Moderate protein calorie malnutrition Body mass index is 19.34 kg/m.  Continue supplement. Renders poor prognosis for the patient.   Hypertension. Blood pressure elevated. As needed hydralazine.     Medications     vitamin C  1,000 mg Per Tube Q8H   budesonide (PULMICORT) nebulizer solution  0.25 mg Nebulization BID   Chlorhexidine Gluconate Cloth  6 each Topical Daily   enoxaparin (LOVENOX) injection  70 mg Subcutaneous Q12H   famotidine   20 mg Per Tube  Daily   feeding supplement (PROSource TF20)  60 mL Per Tube Daily   free water  150 mL Per Tube Q4H   guaiFENesin  400 mg Per Tube Q8H   methylPREDNISolone (SOLU-MEDROL) injection  40 mg Intravenous  Q12H   QUEtiapine  25 mg Oral QPM     Data Reviewed:   CBG:  Recent Labs  Lab 06/09/24 1718 06/09/24 2014 06/09/24 2329 06/10/24 0421 06/10/24 0825  GLUCAP 147* 153* 147* 168* 189*    SpO2: 100 % O2 Flow Rate (L/min): 0.5 L/min FiO2 (%): 28 %    Vitals:   06/10/24 0500 06/10/24 0602 06/10/24 0751 06/10/24 0800  BP:    (!) 157/66  Pulse:  65 90 63  Resp:  (!) 21 (!) 26 (!) 22  Temp:    98.1 F (36.7 C)  TempSrc:    Oral  SpO2:  100% 100% 100%  Weight: 66.9 kg     Height:          Data Reviewed:  Basic Metabolic Panel: Recent Labs  Lab 06/05/24 0422 06/06/24 0248 06/06/24 2026 06/07/24 1900 06/08/24 0758 06/08/24 1351 06/08/24 1952 06/08/24 2338 06/09/24 0831 06/09/24 1124 06/10/24 0446  NA 141 142  --  146* 147*   < > 147* 147* 148* 145 143  K 4.5 4.6  --  3.9 3.8   < > 3.8 3.8 3.7 3.5 3.4*  CL 102 104  --  102 106   < > 107 104 106 104 104  CO2 28 28  --  30 32   < > 30 32 30 28 30   GLUCOSE 138* 144*  --  146* 147*   < > 174* 170* 197* 156* 213*  BUN 49* 50*  --  42* 45*   < > 46* 43* 42* 44* 40*  CREATININE 1.01 0.97  --  0.90 0.85   < > 0.82 0.89 0.77 0.79 0.83  CALCIUM  8.5* 8.7*  --  8.6* 8.8*   < > 8.7* 8.6* 8.6* 8.6* 8.5*  MG 2.5* 2.4  --  2.2 2.2  --   --   --  2.1  --  2.1  PHOS 3.9 1.5* 3.7  --   --   --   --   --  2.3*  --  2.7   < > = values in this interval not displayed.    CBC: Recent Labs  Lab 06/06/24 0248 06/07/24 1900 06/08/24 0758 06/09/24 0831 06/10/24 0446  WBC 16.3* 14.4* 9.0 9.4 10.4  NEUTROABS  --  13.6*  --   --   --   HGB 12.4* 12.3* 12.3* 12.0* 12.2*  HCT 38.3* 38.6* 39.0 36.9* 38.1*  MCV 97.2 98.2 99.2 97.6 97.2  PLT 229 250 253 282 320    LFT Recent Labs  Lab 06/07/24 1900 06/08/24 1351 06/10/24 0446  AST 18 17 19   ALT 27 24 28   ALKPHOS 55 51 55  BILITOT 1.8* 1.4* 1.2  PROT 6.1* 6.2* 5.9*  ALBUMIN 2.8* 2.8* 2.8*     Antibiotics: Anti-infectives (From admission, onward)    Start      Dose/Rate Route Frequency Ordered Stop   06/08/24 1615  Ampicillin-Sulbactam (UNASYN) 3 g in sodium chloride 0.9 % 100 mL IVPB        3 g 200 mL/hr over 30 Minutes Intravenous Every 8 hours 06/08/24 1521     06/02/24 1230  azithromycin  (ZITHROMAX ) tablet 500 mg        500  mg Per Tube Daily 06/02/24 1134 06/04/24 0842   06/02/24 1000  cefTRIAXone (ROCEPHIN) 1 g in sodium chloride 0.9 % 100 mL IVPB  Status:  Discontinued        1 g 200 mL/hr over 30 Minutes Intravenous Every 24 hours 06/02/24 0139 06/02/24 1134   06/01/24 2130  vancomycin (VANCOCIN) IVPB 1000 mg/200 mL premix        1,000 mg 200 mL/hr over 60 Minutes Intravenous  Once 06/01/24 2116 06/01/24 2307   06/01/24 2130  ceFEPIme (MAXIPIME) 2 g in sodium chloride 0.9 % 100 mL IVPB        2 g 200 mL/hr over 30 Minutes Intravenous  Once 06/01/24 2116 06/01/24 2210        DVT prophylaxis: Lovenox  Code Status: Full code  Family Communication: Discussed with patient's wife at bedside   CONSULTS palliative care   Subjective   No new complaints.  Answered questions appropriately.  Pleasantly confused   Objective    Physical Examination:   Appears lethargic, opens eyes to verbal stimuli S1-S2, regular Alert, pleasantly confused Abdomen is soft, nontender, no organomegaly   Status is: Inpatient:             Martin Ford   [facility] If 7PM-7AM, please contact night-coverage at [email or web address], Office  [phone]   06/10/2024, 8:35 AM  LOS: 9 days

## 2024-06-10 NOTE — TOC Progression Note (Signed)
 Transition of Care [facility]) - Progression Note    Patient Details  Name: Martin Ford MRN: [id number] Date of Birth: 10/21/1935  [facility]) CM/SW Contact  Isaiah Public, LCSWA Phone Number: 06/10/2024, 11:31 AM  Clinical Narrative:     CSW following to fax patient out for SNF closer to patient being medically ready for dc. CSW will continue to follow.  Expected Discharge Plan: Skilled Nursing Facility Barriers to Discharge: Continued Medical Work up               Expected Discharge Plan and Services In-house Referral: Clinical Social Work     Living arrangements for the past 2 months: Single Family Home                                       Social Drivers of Health (SDOH) Interventions SDOH Screenings   Food Insecurity: No Food Insecurity (06/01/2022)  Housing: Low Risk  (06/06/2024)  Transportation Needs: No Transportation Needs (06/06/2024)  Utilities: Patient Unable To Answer (06/03/2024)  Alcohol Screen: Low Risk  (06/01/2022)  Depression (PHQ2-9): Low Risk  (02/21/2024)  Financial Resource Strain: Low Risk  (06/01/2022)  Physical Activity: Inactive (06/01/2022)  Social Connections: Unknown (06/01/2022)  Stress: No Stress Concern Present (06/01/2022)  Tobacco Use: Medium Risk (06/02/2024)    Readmission Risk Interventions     No data to display

## 2024-06-10 NOTE — Progress Notes (Signed)
 Palliative Medicine Progress Note   Patient Name: Martin Ford       Date: 06/10/2024 DOB: Jan 20, 1936  Age: 88 y.o. MRN#: [id number] Attending Physician: Drusilla Sabas RAMAN, MD Primary Care Physician: Tobie Suzzane POUR, MD Admit Date: 06/01/2024   HPI/Patient Profile:  88 y.o. male  with past medical history of COPD, chronic respiratory failure on home O2, aortic stenosis, and GERD who presented to the ED on 06/01/2024 with complaint of shortness of breath and confusion.  He was admitted with acute on chronic hypoxic/hypercapnic respiratory failure secondary to AECOPD. He required intubation 9/29 and was extubated 10/3.  Hospitalization has been complicated by AKI, hypernatremia, dysphagia, malnutrition, and delirium. He has remained NPO due to decreased alertness and had cortrak placed on 10/6.   Palliative Medicine has been consulted for goals of care discussions. and complex medical decision making.  Subjective: Chart reviewed. Update received from RN. Patient assessed. He is confused, appears uncomfortable and anxious. Bilateral mitts in place.   I met with his wife/Martin Ford at bedside as a follow-up for palliative needs and emotional support. Education offered regarding the seriousness of his current medical situation and guarded prognosis. Discussed his ongoing delirium/agitation, acute on chronic respiratory failure, poor nutritional status and need for artifical feeding.   Patient understands all this however remains open to all offered and available medical interventions to prolong life and ultimately is hopeful for improvement.  She understands he will not likely return to previous baseline, and is ok with this as long as he retains some sense of self.   Discussed code status. I again encouraged  consideration of DNR/DNI status and provided education on evidence-based poor outcomes in similar hospitalized patients, as the cause of cardiac arrest would likely be associated with advanced illness rather than a reversible condition.   Discussed the importance of continued conversation with the medical team regarding overall plan of care and treatment options. Emotional support provided. Hard choices book provided.    Objective:  Physical Exam Constitutional:      Appearance: He is ill-appearing.  Pulmonary:     Effort: Accessory muscle usage present.  Neurological:     Mental Status: He is lethargic and confused.               Palliative Medicine Assessment & Plan   Assessment: Principal Problem:  COPD (chronic obstructive pulmonary disease) (HCC) Active Problems:   Acute and chronic respiratory failure (acute-on-chronic) (HCC)   Malnutrition of moderate degree    Recommendations/Plan: Continue full scope care Goal of care is medical stabilization and recovery to the extent possible Consider increasing seroquel evening dose PMT will continue to follow  Code Status: Full code  Primary Decision Maker: NEXT OF KIN - spouse/Martin Ford   Existing Vynca/ACP Documentation: None - wife states patient has a living will; I have asked her to bring a copy   Symptom Management:  Per attending  Prognosis:  guarded  Discharge Planning: To Be Determined   Thank you for allowing the Palliative Medicine Team to assist in the care of this patient.   Time: 52 minutes  Detailed review of medical records (labs, imaging, vital signs), medically appropriate exam, discussed with treatment team, counseling and education to patient, family, & staff, documenting clinical information, coordination of care.    Caliph Borowiak B Aleane Wesenberg, NP   Please contact Palliative Medicine Team phone at [phone] for questions and concerns.  For individual providers, please see AMION.

## 2024-06-10 NOTE — Progress Notes (Signed)
 Physical Therapy Treatment Patient Details Name: Martin Ford MRN: [id number] DOB: 1936-04-07 Today's Date: 06/10/2024   History of Present Illness The pt is an 88 yo male presenting to [facility] 9/29 for SOB and progressive AMS. Pt intubated 9/29 and transferred to [facility] 9/30, extubated 10/3. DVT found 10/3. PMH includes: COPD stage 3 chronically on 2L O2, HTN, HLD, hypothyroidism, PVD, and essential tremor.    PT Comments  The pt was agreeable to session, wife present and reports he has been resting intermittently but awake and conversing at times at well. The pt was able to assist with movement of LE to EOB but is dependent on increased assist to elevate trunk to sitting and to maintain sitting balance initially. Pt then completed multiple LE exercises with noted deficits in R quad and no active DF in R foot. While sitting, pt noticed to be soiled of stool despite rectal tube. Then while sitting EOB waiting for assist to clean, HR increased to 157bpm, RR to 40-50s, and BP recorded as 196/153 (167). Re-cycle of BP was 190/117 (140). Pt remained able to answer questions, denies acute distress despite noticeable SOB, and was assisted back to supine with RN present and repositioned in bed. RR remained elevated (30-50s) with SpO2 stable, HR and BP stabilized. Will continue to follow and progress as pt able.   VITALS:  - supine in bed - BP: 168/69 (96); - sitting EOB - BP: 196/153 (167);  - supine in bed HOB elevated to 30 deg - BP: 190/117 (140);  - supine in bed HOB elevated to 30 deg - BP: 169/78 (105)  Max HR: 154bpm RR: 30-50   If plan is discharge home, recommend the following: Two people to help with walking and/or transfers;Two people to help with bathing/dressing/bathroom;Assistance with cooking/housework;Assistance with feeding;Direct supervision/assist for medications management;Direct supervision/assist for financial management;Assist for transportation;Help with stairs or ramp for  entrance;Supervision due to cognitive status   Can travel by private vehicle     No  Equipment Recommendations  Wheelchair (measurements PT);Wheelchair cushion (measurements PT)    Recommendations for Other Services       Precautions / Restrictions Precautions Precautions: Fall Recall of Precautions/Restrictions: Impaired Precaution/Restrictions Comments: B mitts, alarm belt in bed Restrictions Weight Bearing Restrictions Per Provider Order: No     Mobility  Bed Mobility Overal bed mobility: Needs Assistance Bed Mobility: Supine to Sit, Sit to Supine     Supine to sit: Max assist, HOB elevated, Used rails Sit to supine: Mod assist   General bed mobility comments: pt able to assist with movement of LE, but needed assist to elevate trunk and to steady once sitting EOB. modA to return LE to bed and +2 assist to reposition    Transfers                   General transfer comment: deferred due to pt soiled of BM despite rectal tube as well as HR elevation to 157bpm, RR to 40-50s, and BP to 196/153. RN called and pt returned to supine       Balance Overall balance assessment: Needs assistance Sitting-balance support: Feet supported Sitting balance-Leahy Scale: Fair Sitting balance - Comments: dependent on UE support and intermittent minA                                    Communication Communication Communication: Impaired Factors Affecting Communication: Hearing impaired;Reduced clarity of  speech  Cognition Arousal: Lethargic Behavior During Therapy: Flat affect   PT - Cognitive impairments: Orientation, Awareness, Memory, Attention, Initiation, Sequencing, Problem solving, Safety/Judgement   Orientation impairments: Time, Situation                   PT - Cognition Comments: pt needing max cues and increased time to follow commands for mobility, able to follow simple commands for MMT. limited verbalizations in session Following  commands: Impaired Following commands impaired: Follows one step commands inconsistently, Follows one step commands with increased time    Cueing Cueing Techniques: Verbal cues, Gestural cues, Tactile cues  Exercises General Exercises - Lower Extremity Long Arc Quad: AROM, Both, 10 reps, Seated (AAROM to RLE, poor quad activation) Hip Flexion/Marching: AROM, Both, 10 reps    General Comments General comments (skin integrity, edema, etc.): with sitting EOB, HR elevation to 157bpm, RR to 40-50s, and BP to 196/153, 169/78 (105) at end of session. Pt felt urge to urinate but was unable to when sitting EOB, seemed more relaxed after urinating. RN called to room, was able to see vitals and aware pt was soiled but states she was waiting on medication orders from Palliative      Pertinent Vitals/Pain Pain Assessment Pain Assessment: Faces Faces Pain Scale: Hurts a little bit Pain Location: generalized with activity Pain Descriptors / Indicators: Aching Pain Intervention(s): Limited activity within patient's tolerance, Monitored during session, Repositioned     PT Goals (current goals can now be found in the care plan section) Acute Rehab PT Goals Patient Stated Goal: none stated PT Goal Formulation: With patient Time For Goal Achievement: 06/22/24 Potential to Achieve Goals: Fair Progress towards PT goals: Not progressing toward goals - comment (less stability in VS today)    Frequency    Min 2X/week       AM-PAC PT 6 Clicks Mobility   Outcome Measure  Help needed turning from your back to your side while in a flat bed without using bedrails?: A Lot Help needed moving from lying on your back to sitting on the side of a flat bed without using bedrails?: A Lot Help needed moving to and from a bed to a chair (including a wheelchair)?: Total Help needed standing up from a chair using your arms (e.g., wheelchair or bedside chair)?: Total Help needed to walk in hospital room?:  Total Help needed climbing 3-5 steps with a railing? : Total 6 Click Score: 8    End of Session Equipment Utilized During Treatment: Gait belt;Oxygen  Activity Tolerance: Patient limited by fatigue;Treatment limited secondary to medical complications (Comment) (increased HR, RR, and BP) Patient left: with call bell/phone within reach;with family/visitor present;in bed;with bed alarm set;with restraints reapplied Nurse Communication: Mobility status;Need for lift equipment (stedy) PT Visit Diagnosis: Unsteadiness on feet (R26.81);Other abnormalities of gait and mobility (R26.89);Muscle weakness (generalized) (M62.81)     Time: 8490-8462 PT Time Calculation (min) (ACUTE ONLY): 28 min  Charges:    $Therapeutic Exercise: 8-22 mins $Therapeutic Activity: 8-22 mins PT General Charges $$ ACUTE PT VISIT: 1 Visit                     Izetta Call, PT, DPT   Acute Rehabilitation Department Office [phone] Secure Chat Communication Preferred   Izetta JULIANNA Call 06/10/2024, 4:13 PM

## 2024-06-10 NOTE — Plan of Care (Signed)
?  Problem: Nutrition: Goal: Adequate nutrition will be maintained Outcome: Progressing   Problem: Elimination: Goal: Will not experience complications related to urinary retention Outcome: Progressing   Problem: Safety: Goal: Ability to remain free from injury will improve Outcome: Progressing   

## 2024-06-10 NOTE — Significant Event (Signed)
 Patient's nurse notified me that patient briefly went into A-fib with RVR and converted back to sinus rhythm.  Blood pressure 180/80.  Patient is afebrile.  I reviewed patient's medications notes and labs.  CHA2DS2-VASc score of at least 3.  Patient is already on full dose anticoagulation for DVT.  Has had recent 2D echo done on June 04, 2024 which showed EF of 55 to 60% with grade 1 diastolic dysfunction.  Will check TSH troponin.  Will continue to monitor closely.  Redia Cleaver

## 2024-06-10 NOTE — Progress Notes (Signed)
 Patient is becoming more agitated, restless, climbing out of bed and started pulling lines. Unable to redirect patient. PRN Haldol given.pr

## 2024-06-10 NOTE — Progress Notes (Signed)
   06/10/24 0602  Provider Notification  Provider Name/Title Dr Franky  Date Provider Notified 06/10/24  Time Provider Notified 0602  Method of Notification Page [facility] chat)  Notification Reason Critical Result  Test performed and critical result Troponin 105  Date Critical Result Received 06/10/24  Time Critical Result Received 0600  Provider response See new orders (recheck troponin)  Date of Provider Response 06/10/24  Time of Provider Response [phone]

## 2024-06-10 NOTE — Plan of Care (Signed)
  Problem: Education: Goal: Knowledge of General Education information will improve Description: Including pain rating scale, medication(s)/side effects and non-pharmacologic comfort measures Outcome: Progressing   Problem: Health Behavior/Discharge Planning: Goal: Ability to manage health-related needs will improve Outcome: Progressing   Problem: Clinical Measurements: Goal: Ability to maintain clinical measurements within normal limits will improve Outcome: Progressing Goal: Will remain free from infection Outcome: Progressing Goal: Diagnostic test results will improve Outcome: Progressing Goal: Respiratory complications will improve Outcome: Progressing Goal: Cardiovascular complication will be avoided Outcome: Progressing   Problem: Activity: Goal: Risk for activity intolerance will decrease Outcome: Progressing   Problem: Nutrition: Goal: Adequate nutrition will be maintained Outcome: Progressing   Problem: Coping: Goal: Level of anxiety will decrease Outcome: Progressing   Problem: Elimination: Goal: Will not experience complications related to bowel motility Outcome: Progressing Goal: Will not experience complications related to urinary retention Outcome: Progressing   Problem: Pain Managment: Goal: General experience of comfort will improve and/or be controlled Outcome: Progressing   Problem: Safety: Goal: Ability to remain free from injury will improve Outcome: Progressing   Problem: Skin Integrity: Goal: Risk for impaired skin integrity will decrease Outcome: Progressing   Problem: Safety: Goal: Non-violent Restraint(s) Outcome: Progressing   Problem: Education: Goal: Ability to describe self-care measures that may prevent or decrease complications (Diabetes Survival Skills Education) will improve Outcome: Progressing Goal: Individualized Educational Video(s) Outcome: Progressing   Problem: Coping: Goal: Ability to adjust to condition or change  in health will improve Outcome: Progressing   Problem: Fluid Volume: Goal: Ability to maintain a balanced intake and output will improve Outcome: Progressing   Problem: Health Behavior/Discharge Planning: Goal: Ability to identify and utilize available resources and services will improve Outcome: Progressing Goal: Ability to manage health-related needs will improve Outcome: Progressing   Problem: Metabolic: Goal: Ability to maintain appropriate glucose levels will improve Outcome: Progressing   Problem: Nutritional: Goal: Maintenance of adequate nutrition will improve Outcome: Progressing Goal: Progress toward achieving an optimal weight will improve Outcome: Progressing   Problem: Skin Integrity: Goal: Risk for impaired skin integrity will decrease Outcome: Progressing   Problem: Tissue Perfusion: Goal: Adequacy of tissue perfusion will improve Outcome: Progressing

## 2024-06-11 DIAGNOSIS — J9621 Acute and chronic respiratory failure with hypoxia: Secondary | ICD-10-CM | POA: Diagnosis not present

## 2024-06-11 DIAGNOSIS — E440 Moderate protein-calorie malnutrition: Secondary | ICD-10-CM | POA: Diagnosis not present

## 2024-06-11 DIAGNOSIS — J9601 Acute respiratory failure with hypoxia: Secondary | ICD-10-CM | POA: Diagnosis not present

## 2024-06-11 LAB — CBC
HCT: 41.8 % (ref 39.0–52.0)
Hemoglobin: 13.4 g/dL (ref 13.0–17.0)
MCH: 31.3 pg (ref 26.0–34.0)
MCHC: 32.1 g/dL (ref 30.0–36.0)
MCV: 97.7 fL (ref 80.0–100.0)
Platelets: 367 K/uL (ref 150–400)
RBC: 4.28 MIL/uL (ref 4.22–5.81)
RDW: 13.1 % (ref 11.5–15.5)
WBC: 14.9 K/uL — ABNORMAL HIGH (ref 4.0–10.5)
nRBC: 0 % (ref 0.0–0.2)

## 2024-06-11 LAB — GLUCOSE, CAPILLARY
Glucose-Capillary: 198 mg/dL — ABNORMAL HIGH (ref 70–99)
Glucose-Capillary: 174 mg/dL — ABNORMAL HIGH (ref 70–99)
Glucose-Capillary: 165 mg/dL — ABNORMAL HIGH (ref 70–99)
Glucose-Capillary: 154 mg/dL — ABNORMAL HIGH (ref 70–99)
Glucose-Capillary: 169 mg/dL — ABNORMAL HIGH (ref 70–99)
Glucose-Capillary: 138 mg/dL — ABNORMAL HIGH (ref 70–99)

## 2024-06-11 LAB — MAGNESIUM: Magnesium: 2.2 mg/dL (ref 1.7–2.4)

## 2024-06-11 LAB — PHOSPHORUS: Phosphorus: 3.3 mg/dL (ref 2.5–4.6)

## 2024-06-11 MED ORDER — METHYLPREDNISOLONE SODIUM SUCC 40 MG IJ SOLR
40.0000 mg | INTRAMUSCULAR | Status: DC
  Administered 2024-06-12 – 2024-06-16 (×5): 40 mg via INTRAVENOUS
  Filled 2024-06-11 (×5): qty 1

## 2024-06-11 NOTE — TOC Progression Note (Signed)
 Transition of Care [facility]) - Progression Note    Patient Details  Name: Martin Ford MRN: [id number] Date of Birth: 1936/04/17  [facility]) CM/SW Contact  Isaiah Public, LCSWA Phone Number: 06/11/2024, 1:30 PM  Clinical Narrative:     CSW following to fax patient out for SNF closer to patient being medically ready for dc. CSW will continue to follow.   Expected Discharge Plan: Skilled Nursing Facility Barriers to Discharge: Continued Medical Work up               Expected Discharge Plan and Services In-house Referral: Clinical Social Work     Living arrangements for the past 2 months: Single Family Home                                       Social Drivers of Health (SDOH) Interventions SDOH Screenings   Food Insecurity: No Food Insecurity (06/01/2022)  Housing: Low Risk  (06/06/2024)  Transportation Needs: No Transportation Needs (06/06/2024)  Utilities: Patient Unable To Answer (06/03/2024)  Alcohol Screen: Low Risk  (06/01/2022)  Depression (PHQ2-9): Low Risk  (02/21/2024)  Financial Resource Strain: Low Risk  (06/01/2022)  Physical Activity: Inactive (06/01/2022)  Social Connections: Unknown (06/01/2022)  Stress: No Stress Concern Present (06/01/2022)  Tobacco Use: Medium Risk (06/02/2024)    Readmission Risk Interventions     No data to display

## 2024-06-11 NOTE — Progress Notes (Signed)
 Speech Language Pathology Treatment: Dysphagia  Patient Details Name: Martin Ford MRN: [id number] DOB: [birth year] Today's Date: 06/11/2024 Time: 8360-8352 SLP Time Calculation (min) (ACUTE ONLY): 8 min  Assessment / Plan / Recommendation Clinical Impression  Pt much more alert today, consistently keeping his eyes opened throughout the session. He drank water via spoon and straw with consistent coughing after swallowing multiple times. Pt eager for POs today, expressing that he was feeling hungry. Feel he is now ready to proceed with an MBS, which SLP will f/u to complete as scheduling allows. In the interim, recommend he be NPO except for ice chips after oral care.    HPI HPI: Pt is a 88 y.o. male who presented with worsening dyspnea despite increase in O2 from baseline 2L to 4L. He was taken to [facility] ED where he required intubation. CTA chest demonstrated Bronchiectasis, no PE. CT head demonstrated no acute issues, generalized atrophy and sinus thickening. ETT 9/29- 10/3 (5 days). Failed [facility] Swallow Screen 10/4. PMH: COPD Stage 3 by GOLD (PFT's Dec 2022 with FEV1 0.95 (38%), ratio 0.48) and chronically on 2L O2.      SLP Plan  MBS          Recommendations  Diet recommendations: NPO Medication Administration: Via alternative means                  Oral care QID;Oral care prior to ice chip/H20   Frequent or constant Supervision/Assistance Dysphagia, unspecified (R13.10)     MBS     Damien Blumenthal, M.A., CCC-SLP Speech Language Pathology, Acute Rehabilitation Services  Secure Chat preferred [phone]   06/11/2024, 5:01 PM

## 2024-06-11 NOTE — Progress Notes (Signed)
 [facility]  PROGRESS NOTE  Martin Ford FMW:[id number] DOB: 01/10/1936 DOA: 06/01/2024 PCP: Tobie Suzzane POUR, MD   Brief HPI:    Patient with PMH of COPD, GERD, chronic respiratory failure on 2 LPM, aortic stenosis presented to the hospital with complaints of confusion and shortness of breath. He sees Dr. Darlean and just saw him as an outpatient on 05/14/24 and was prescribed a prednisone  taper and 10 day course of Doxycycline .   On 9/29, he had worsening dyspnea despite increase in O2 from baseline 2L to 4L. He was taken to [facility] ED where he required intubation. He was given 1 dose of Vancomycin and Cefepime empirically.   CTA chest demonstrated Bronchiectasis, no PE. CT head demonstrated no acute issues, generalized atrophy and sinus thickening.   He was later transferred to [facility] for further management.   Assessment/Plan:   Acute on chronic hypoxic/hypercapnic respiratory failure 2/2 AECOPD - COPD Aspiration pneumonia. Required intubation on 9/29. Uses 2 LPM chronically at home. Currently on 2 LPM. Was on BiPAP after extubation. Patient appears to be having very poor cough. Chest PT was ordered.  Currently discontinued by respiratory therapist. Difficulty use BiPAP in the setting of Cortrak. Currently on IV Unasyn. Continue Solu-Medrol and nebulizer therapy.   AKI: Creatinine went to 2.24. Post diuresis. Renal function improving.  normal values.   Hypernatremia. From poor p.o. intake. Treated with free water and D10. Continue free water.   Mitral valve prolapse. Moderate aortic stenosis. Moderate aortic regurgitation. ICU discussed with cardiologist Dr  Ren Maha.  Recommended to follow as an outpatient This renders very poor prognosis for the patient's current presentation though.   Hyperglycemia/hypoglycemia Hyperglycemia presumed 2/2 steroids.  Later on become hypoglycemic as steroids are weaned and tube feeds are stopped. Treated with D10.  Now on Cortrak.      Dysphagia. Poor p.o. intake. Secondary to patient's ongoing delirium patient is not a good candidate for swallow. SLP has been following but patient has not been able to pass swallow evaluation. Suspect prognosis is poor. There is also concern for aspiration pneumonia for which the patient is on antibiotics.   Moderate protein calorie malnutrition Dietitian consulting. Cortrak placed. Initiating tube feed.   Acute bilateral distal DVT. CTPE negative for PE. Lower extremity Doppler on 10/3 positive for bilateral distal DVT No significant edema. Initially was on Eliquis.  Now on Lovenox.   Diarrhea: Likely associated with tube feeding.  May need fiber supplements. Currently no GI symptoms. No further workup.   Delirium. Patient is somewhat confused and agitated. Required Precedex in the ICU. Unchanged delirium compared to evaluation on 10/5. Will monitor closely.  Continue Seroquel.   Goals of care conversation. Discussed with wife at bedside. Patient has a living will that states that if he is not evaluated to state he would like to be resuscitated. But in a vegetative state he would not like to be kept alive artificially in machine. Palliative care consulted.  Patient's prognosis appears to be poor. Monitor.   Moderate protein calorie malnutrition Body mass index is 19.34 kg/m.  Continue supplement. Renders poor prognosis for the patient.   Hypertension. Blood pressure elevated. As needed hydralazine.     Medications     vitamin C  1,000 mg Per Tube Q8H   budesonide (PULMICORT) nebulizer solution  0.25 mg Nebulization BID   Chlorhexidine Gluconate Cloth  6 each Topical Daily   enoxaparin (LOVENOX) injection  70 mg Subcutaneous Q12H   famotidine   20 mg Per Tube  Daily   feeding supplement (PROSource TF20)  60 mL Per Tube Daily   free water  150 mL Per Tube Q4H   guaiFENesin  400 mg Per Tube Q8H   methylPREDNISolone (SOLU-MEDROL) injection  40 mg Intravenous  Q12H   QUEtiapine  25 mg Oral QPM     Data Reviewed:   CBG:  Recent Labs  Lab 06/10/24 1701 06/10/24 2018 06/10/24 2313 06/11/24 0316 06/11/24 0753  GLUCAP 188* 201* 162* 198* 174*    SpO2: 100 % O2 Flow Rate (L/min): 1 L/min FiO2 (%): 28 %    Vitals:   06/10/24 2305 06/11/24 0314 06/11/24 0608 06/11/24 0800  BP: 136/75 (!) 142/64 (!) 182/102 (!) 102/44  Pulse: 68 64    Resp: 20 20    Temp: 97.7 F (36.5 C) 98 F (36.7 C)  97.8 F (36.6 C)  TempSrc: Oral Oral  Oral  SpO2: 95% 100%    Weight:  69.9 kg    Height:          Data Reviewed:  Basic Metabolic Panel: Recent Labs  Lab 06/06/24 0248 06/06/24 2026 06/07/24 1900 06/08/24 0758 06/08/24 1351 06/08/24 1952 06/08/24 2338 06/09/24 0831 06/09/24 1124 06/10/24 0446 06/11/24 0418  NA 142  --  146* 147*   < > 147* 147* 148* 145 143  --   K 4.6  --  3.9 3.8   < > 3.8 3.8 3.7 3.5 3.4*  --   CL 104  --  102 106   < > 107 104 106 104 104  --   CO2 28  --  30 32   < > 30 32 30 28 30   --   GLUCOSE 144*  --  146* 147*   < > 174* 170* 197* 156* 213*  --   BUN 50*  --  42* 45*   < > 46* 43* 42* 44* 40*  --   CREATININE 0.97  --  0.90 0.85   < > 0.82 0.89 0.77 0.79 0.83  --   CALCIUM  8.7*  --  8.6* 8.8*   < > 8.7* 8.6* 8.6* 8.6* 8.5*  --   MG 2.4  --  2.2 2.2  --   --   --  2.1  --  2.1 2.2  PHOS 1.5* 3.7  --   --   --   --   --  2.3*  --  2.7 3.3   < > = values in this interval not displayed.    CBC: Recent Labs  Lab 06/07/24 1900 06/08/24 0758 06/09/24 0831 06/10/24 0446 06/11/24 0418  WBC 14.4* 9.0 9.4 10.4 14.9*  NEUTROABS 13.6*  --   --   --   --   HGB 12.3* 12.3* 12.0* 12.2* 13.4  HCT 38.6* 39.0 36.9* 38.1* 41.8  MCV 98.2 99.2 97.6 97.2 97.7  PLT 250 253 282 320 367    LFT Recent Labs  Lab 06/07/24 1900 06/08/24 1351 06/10/24 0446  AST 18 17 19   ALT 27 24 28   ALKPHOS 55 51 55  BILITOT 1.8* 1.4* 1.2  PROT 6.1* 6.2* 5.9*  ALBUMIN 2.8* 2.8* 2.8*     Antibiotics: Anti-infectives  (From admission, onward)    Start     Dose/Rate Route Frequency Ordered Stop   06/08/24 1615  Ampicillin-Sulbactam (UNASYN) 3 g in sodium chloride 0.9 % 100 mL IVPB        3 g 200 mL/hr over 30 Minutes Intravenous Every 8 hours 06/08/24  1521     06/02/24 1230  azithromycin  (ZITHROMAX ) tablet 500 mg        500 mg Per Tube Daily 06/02/24 1134 06/04/24 0842   06/02/24 1000  cefTRIAXone (ROCEPHIN) 1 g in sodium chloride 0.9 % 100 mL IVPB  Status:  Discontinued        1 g 200 mL/hr over 30 Minutes Intravenous Every 24 hours 06/02/24 0139 06/02/24 1134   06/01/24 2130  vancomycin (VANCOCIN) IVPB 1000 mg/200 mL premix        1,000 mg 200 mL/hr over 60 Minutes Intravenous  Once 06/01/24 2116 06/01/24 2307   06/01/24 2130  ceFEPIme (MAXIPIME) 2 g in sodium chloride 0.9 % 100 mL IVPB        2 g 200 mL/hr over 30 Minutes Intravenous  Once 06/01/24 2116 06/01/24 2210        DVT prophylaxis: Lovenox  Code Status: Full code  Family Communication: Discussed with patient's wife at bedside   CONSULTS palliative care   Subjective   Patient seen and examined, continues to be confused.   Objective    Physical Examination:   Appears in no acute distress Confused S1-S2, regular Lungs clear to auscultation bilaterally  Status is: Inpatient:             Martin Ford   [facility] If 7PM-7AM, please contact night-coverage at [email or web address], Office  [phone]   06/11/2024, 8:54 AM  LOS: 10 days

## 2024-06-11 NOTE — Plan of Care (Signed)
  Problem: Education: Goal: Knowledge of General Education information will improve Description: Including pain rating scale, medication(s)/side effects and non-pharmacologic comfort measures Outcome: Progressing   Problem: Health Behavior/Discharge Planning: Goal: Ability to manage health-related needs will improve Outcome: Progressing   Problem: Clinical Measurements: Goal: Ability to maintain clinical measurements within normal limits will improve Outcome: Progressing Goal: Will remain free from infection Outcome: Progressing Goal: Diagnostic test results will improve Outcome: Progressing Goal: Respiratory complications will improve Outcome: Progressing Goal: Cardiovascular complication will be avoided Outcome: Progressing   Problem: Activity: Goal: Risk for activity intolerance will decrease Outcome: Progressing   Problem: Nutrition: Goal: Adequate nutrition will be maintained Outcome: Progressing   Problem: Coping: Goal: Level of anxiety will decrease Outcome: Progressing   Problem: Elimination: Goal: Will not experience complications related to bowel motility Outcome: Progressing Goal: Will not experience complications related to urinary retention Outcome: Progressing   Problem: Pain Managment: Goal: General experience of comfort will improve and/or be controlled Outcome: Progressing   Problem: Safety: Goal: Ability to remain free from injury will improve Outcome: Progressing   Problem: Skin Integrity: Goal: Risk for impaired skin integrity will decrease Outcome: Progressing   Problem: Safety: Goal: Non-violent Restraint(s) Outcome: Progressing   Problem: Education: Goal: Ability to describe self-care measures that may prevent or decrease complications (Diabetes Survival Skills Education) will improve Outcome: Progressing Goal: Individualized Educational Video(s) Outcome: Progressing   Problem: Coping: Goal: Ability to adjust to condition or change  in health will improve Outcome: Progressing   Problem: Fluid Volume: Goal: Ability to maintain a balanced intake and output will improve Outcome: Progressing   Problem: Health Behavior/Discharge Planning: Goal: Ability to identify and utilize available resources and services will improve Outcome: Progressing Goal: Ability to manage health-related needs will improve Outcome: Progressing   Problem: Metabolic: Goal: Ability to maintain appropriate glucose levels will improve Outcome: Progressing   Problem: Nutritional: Goal: Maintenance of adequate nutrition will improve Outcome: Progressing Goal: Progress toward achieving an optimal weight will improve Outcome: Progressing   Problem: Skin Integrity: Goal: Risk for impaired skin integrity will decrease Outcome: Progressing   Problem: Tissue Perfusion: Goal: Adequacy of tissue perfusion will improve Outcome: Progressing

## 2024-06-12 ENCOUNTER — Inpatient Hospital Stay (HOSPITAL_COMMUNITY)

## 2024-06-12 DIAGNOSIS — Z515 Encounter for palliative care: Secondary | ICD-10-CM | POA: Diagnosis not present

## 2024-06-12 DIAGNOSIS — J449 Chronic obstructive pulmonary disease, unspecified: Secondary | ICD-10-CM | POA: Diagnosis not present

## 2024-06-12 DIAGNOSIS — R410 Disorientation, unspecified: Secondary | ICD-10-CM

## 2024-06-12 DIAGNOSIS — Z7189 Other specified counseling: Secondary | ICD-10-CM | POA: Diagnosis not present

## 2024-06-12 DIAGNOSIS — J9621 Acute and chronic respiratory failure with hypoxia: Secondary | ICD-10-CM | POA: Diagnosis not present

## 2024-06-12 LAB — GLUCOSE, CAPILLARY
Glucose-Capillary: 151 mg/dL — ABNORMAL HIGH (ref 70–99)
Glucose-Capillary: 135 mg/dL — ABNORMAL HIGH (ref 70–99)
Glucose-Capillary: 131 mg/dL — ABNORMAL HIGH (ref 70–99)
Glucose-Capillary: 195 mg/dL — ABNORMAL HIGH (ref 70–99)
Glucose-Capillary: 171 mg/dL — ABNORMAL HIGH (ref 70–99)

## 2024-06-12 LAB — BASIC METABOLIC PANEL WITH GFR
Anion gap: 10 (ref 5–15)
BUN: 39 mg/dL — ABNORMAL HIGH (ref 8–23)
CO2: 30 mmol/L (ref 22–32)
Calcium: 8.4 mg/dL — ABNORMAL LOW (ref 8.9–10.3)
Chloride: 101 mmol/L (ref 98–111)
Creatinine, Ser: 0.84 mg/dL (ref 0.61–1.24)
GFR, Estimated: >60 mL/min (ref >60)
Glucose, Bld: 157 mg/dL — ABNORMAL HIGH (ref 70–99)
Potassium: 3.7 mmol/L (ref 3.5–5.1)
Sodium: 141 mmol/L (ref 135–145)

## 2024-06-12 LAB — CBC
HCT: 38.5 % — ABNORMAL LOW (ref 39.0–52.0)
Hemoglobin: 12.7 g/dL — ABNORMAL LOW (ref 13.0–17.0)
MCH: 31.6 pg (ref 26.0–34.0)
MCHC: 33 g/dL (ref 30.0–36.0)
MCV: 95.8 fL (ref 80.0–100.0)
Platelets: 328 K/uL (ref 150–400)
RBC: 4.02 MIL/uL — ABNORMAL LOW (ref 4.22–5.81)
RDW: 13.1 % (ref 11.5–15.5)
WBC: 13.8 K/uL — ABNORMAL HIGH (ref 4.0–10.5)
nRBC: 0 % (ref 0.0–0.2)

## 2024-06-12 LAB — MAGNESIUM: Magnesium: 2.2 mg/dL (ref 1.7–2.4)

## 2024-06-12 MED ORDER — FUROSEMIDE 10 MG/ML IJ SOLN
20.0000 mg | Freq: Once | INTRAMUSCULAR | Status: AC
  Administered 2024-06-12: 20 mg via INTRAVENOUS
  Filled 2024-06-12: qty 2

## 2024-06-12 MED ORDER — MORPHINE SULFATE (PF) 2 MG/ML IV SOLN
1.0000 mg | Freq: Once | INTRAVENOUS | Status: AC
  Administered 2024-06-12: 1 mg via INTRAVENOUS
  Filled 2024-06-12: qty 1

## 2024-06-12 MED FILL — Fentanyl Citrate Preservative Free (PF) Inj 100 MCG/2ML: INTRAMUSCULAR | Qty: 2 | Status: AC

## 2024-06-12 NOTE — Evaluation (Signed)
 Modified Barium Swallow Study  Patient Details  Name: Martin Ford MRN: [id number] Date of Birth: 12/13/1935  Today's Date: 06/12/2024  Modified Barium Swallow completed.  Full report located under Chart Review in the Imaging Section.  History of Present Illness Pt is a 88 y.o. male who presented with worsening dyspnea despite increase in O2 from baseline 2L to 4L. He was taken to [facility] ED where he required intubation. CTA chest demonstrated Bronchiectasis, no PE. CT head demonstrated no acute issues, generalized atrophy and sinus thickening. ETT 9/29- 10/3 (5 days). Failed [facility] Swallow Screen 10/4. PMH: COPD Stage 3 by GOLD (PFT's Dec 2022 with FEV1 0.95 (38%), ratio 0.48) and chronically on 2L O2.   Clinical Impression Pt has a mild pharyngeal dysphagia (DIGEST score of 1) with possible esophageal component as well. He became more short of breath as trials continued, but only exhibited difficulty coordinating breathing and swallowing x1 when drinking thin liquids via straw. Aspiration was sensed, but not able to visualize if it cleared (PAS 7). On other trials, pt achieved better timing. He had some reduced anterior hyoid excursion UES opening but this only had a functional impact on swallowing the barium tablet, although question if presence of Cortrak may have also contributed. The tablet was initially stuck in the valleculae, requiring both additional purees and a chin tuck to clear it. It was then delayed in the proximal esophagus, and then the distal esophagus, not observed to fully exit the esophagus. Recommend initiating Dys 3 (mechanical soft) diet and thin liquids by cup, as well as meds crushed in puree.  DIGEST Swallow Severity Rating*  Safety: 1  Efficiency: 0  Overall Pharyngeal Swallow Severity:  1: mild; 2: moderate; 3: severe; 4: profound  *The Dynamic Imaging Grade of Swallowing Toxicity is standardized for the head and neck cancer population, however, demonstrates promising  clinical applications across populations to standardize the clinical rating of pharyngeal swallow safety and severity.  Factors that may increase risk of adverse event in presence of aspiration Noe & Lianne 2021): Respiratory or GI disease;Frail or deconditioned  Swallow Evaluation Recommendations Recommendations: PO diet PO Diet Recommendation: Dysphagia 3 (Mechanical soft);Thin liquids (Level 0) Liquid Administration via: Cup;No straw Medication Administration: Crushed with puree Supervision: Staff to assist with self-feeding;Full supervision/cueing for swallowing strategies Swallowing strategies  : Minimize environmental distractions;Slow rate;Small bites/sips;Follow solids with liquids (take breaks for breathing PRN) Postural changes: Position pt fully upright for meals;Stay upright 30-60 min after meals Oral care recommendations: Oral care BID (2x/day)      Leita SAILOR., M.A. CCC-SLP Acute Rehabilitation Services Office: [phone]  Secure chat preferred  06/12/2024,1:04 PM

## 2024-06-12 NOTE — Plan of Care (Signed)
  Problem: Education: Goal: Knowledge of General Education information will improve Description: Including pain rating scale, medication(s)/side effects and non-pharmacologic comfort measures Outcome: Progressing   Problem: Health Behavior/Discharge Planning: Goal: Ability to manage health-related needs will improve Outcome: Progressing   Problem: Clinical Measurements: Goal: Ability to maintain clinical measurements within normal limits will improve Outcome: Progressing Goal: Will remain free from infection Outcome: Progressing Goal: Diagnostic test results will improve Outcome: Progressing Goal: Respiratory complications will improve Outcome: Progressing Goal: Cardiovascular complication will be avoided Outcome: Progressing   Problem: Activity: Goal: Risk for activity intolerance will decrease Outcome: Progressing   Problem: Nutrition: Goal: Adequate nutrition will be maintained Outcome: Progressing   Problem: Coping: Goal: Level of anxiety will decrease Outcome: Progressing   Problem: Elimination: Goal: Will not experience complications related to bowel motility Outcome: Progressing Goal: Will not experience complications related to urinary retention Outcome: Progressing   Problem: Pain Managment: Goal: General experience of comfort will improve and/or be controlled Outcome: Progressing   Problem: Safety: Goal: Ability to remain free from injury will improve Outcome: Progressing   Problem: Skin Integrity: Goal: Risk for impaired skin integrity will decrease Outcome: Progressing   Problem: Safety: Goal: Non-violent Restraint(s) Outcome: Progressing   Problem: Education: Goal: Ability to describe self-care measures that may prevent or decrease complications (Diabetes Survival Skills Education) will improve Outcome: Progressing Goal: Individualized Educational Video(s) Outcome: Progressing   Problem: Coping: Goal: Ability to adjust to condition or change  in health will improve Outcome: Progressing   Problem: Fluid Volume: Goal: Ability to maintain a balanced intake and output will improve Outcome: Progressing   Problem: Health Behavior/Discharge Planning: Goal: Ability to identify and utilize available resources and services will improve Outcome: Progressing Goal: Ability to manage health-related needs will improve Outcome: Progressing   Problem: Metabolic: Goal: Ability to maintain appropriate glucose levels will improve Outcome: Progressing   Problem: Nutritional: Goal: Maintenance of adequate nutrition will improve Outcome: Progressing Goal: Progress toward achieving an optimal weight will improve Outcome: Progressing   Problem: Skin Integrity: Goal: Risk for impaired skin integrity will decrease Outcome: Progressing   Problem: Tissue Perfusion: Goal: Adequacy of tissue perfusion will improve Outcome: Progressing

## 2024-06-12 NOTE — Progress Notes (Signed)
 [facility]  PROGRESS NOTE  Martin Ford FMW:[id number] DOB: 08-06-1936 DOA: 06/01/2024 PCP: Tobie Suzzane POUR, MD   Brief HPI:    Patient with PMH of COPD, GERD, chronic respiratory failure on 2 LPM, aortic stenosis presented to the hospital with complaints of confusion and shortness of breath. He sees Dr. Darlean and just saw him as an outpatient on 05/14/24 and was prescribed a prednisone  taper and 10 day course of Doxycycline .   On 9/29, he had worsening dyspnea despite increase in O2 from baseline 2L to 4L. He was taken to [facility] ED where he required intubation. He was given 1 dose of Vancomycin and Cefepime empirically.   CTA chest demonstrated Bronchiectasis, no PE. CT head demonstrated no acute issues, generalized atrophy and sinus thickening.   He was later transferred to [facility] for further management.   Assessment/Plan:   Acute on chronic hypoxic/hypercapnic respiratory failure 2/2 AECOPD - COPD Aspiration pneumonia. Required intubation on 9/29. Uses 2 LPM chronically at home. Currently on 2 LPM. Was on BiPAP after extubation. Patient appears to be having very poor cough. Chest PT was ordered.  Currently discontinued by respiratory therapist. Difficulty use BiPAP in the setting of Cortrak. Currently on IV Unasyn.  Last day of therapy 06/12/2024 Continue Solu-Medrol and nebulizer therapy. -Will give 1 dose of Lasix 20 mg IV   AKI: Creatinine went to 2.24. Post diuresis. Renal function improving.  normal values.   Hypernatremia. From poor p.o. intake. Treated with free water and D10. Continue free water.   Mitral valve prolapse. Moderate aortic stenosis. Moderate aortic regurgitation. ICU discussed with cardiologist Dr  Ren Maha.  Recommended to follow as an outpatient This renders very poor prognosis for the patient's current presentation though.   Hyperglycemia/hypoglycemia Hyperglycemia presumed 2/2 steroids.  Later on become hypoglycemic as steroids are  weaned and tube feeds are stopped. Treated with D10.  Now on Cortrak.     Dysphagia. Poor p.o. intake. Secondary to patient's ongoing delirium patient is not a good candidate for swallow. SLP has been following but patient has not been able to pass swallow evaluation. Suspect prognosis is poor. There is also concern for aspiration pneumonia for which the patient is on antibiotics.   Moderate protein calorie malnutrition Dietitian consulting. Cortrak placed. Initiating tube feed.   Acute bilateral distal DVT. CTPE negative for PE. Lower extremity Doppler on 10/3 positive for bilateral distal DVT No significant edema. Initially was on Eliquis.  Now on Lovenox.   Diarrhea: Likely associated with tube feeding.  May need fiber supplements. Currently no GI symptoms. No further workup.   Delirium. Patient is somewhat confused and agitated. Required Precedex in the ICU. Unchanged delirium compared to evaluation on 10/5. Will monitor closely.  Continue Seroquel.   Goals of care conversation. Discussed with wife at bedside. Patient has a living will that states that if he is not evaluated to state he would like to be resuscitated. But in a vegetative state he would not like to be kept alive artificially in machine. Palliative care consulted.  Patient's prognosis appears to be poor. Monitor.   Moderate protein calorie malnutrition Body mass index is 19.34 kg/m.  Continue supplement. Renders poor prognosis for the patient.   Hypertension. Blood pressure elevated. As needed hydralazine.     Medications     vitamin C  1,000 mg Per Tube Q8H   budesonide (PULMICORT) nebulizer solution  0.25 mg Nebulization BID   Chlorhexidine Gluconate Cloth  6 each Topical Daily   enoxaparin (  LOVENOX) injection  70 mg Subcutaneous Q12H   famotidine   20 mg Per Tube Daily   feeding supplement (PROSource TF20)  60 mL Per Tube Daily   free water  150 mL Per Tube Q4H   guaiFENesin  400 mg Per  Tube Q8H   methylPREDNISolone (SOLU-MEDROL) injection  40 mg Intravenous Q24H   QUEtiapine  25 mg Oral QPM     Data Reviewed:   CBG:  Recent Labs  Lab 06/11/24 1644 06/11/24 1928 06/11/24 2322 06/12/24 0358 06/12/24 0740  GLUCAP 154* 169* 138* 151* 135*    SpO2: 97 % O2 Flow Rate (L/min): 3 L/min FiO2 (%): 28 %    Vitals:   06/12/24 0520 06/12/24 0530 06/12/24 0742 06/12/24 0751  BP: 134/73  (!) 160/98   Pulse: 63 65 68   Resp:   (!) 24 (!) 21  Temp:   (!) 97.4 F (36.3 C)   TempSrc:   Axillary   SpO2: 95% 97% 97%   Weight:      Height:          Data Reviewed:  Basic Metabolic Panel: Recent Labs  Lab 06/06/24 0248 06/06/24 2026 06/07/24 1900 06/08/24 0758 06/08/24 1351 06/08/24 2338 06/09/24 0831 06/09/24 1124 06/10/24 0446 06/11/24 0418 06/12/24 0425  NA 142  --    < > 147*   < > 147* 148* 145 143  --  141  K 4.6  --    < > 3.8   < > 3.8 3.7 3.5 3.4*  --  3.7  CL 104  --    < > 106   < > 104 106 104 104  --  101  CO2 28  --    < > 32   < > 32 30 28 30   --  30  GLUCOSE 144*  --    < > 147*   < > 170* 197* 156* 213*  --  157*  BUN 50*  --    < > 45*   < > 43* 42* 44* 40*  --  39*  CREATININE 0.97  --    < > 0.85   < > 0.89 0.77 0.79 0.83  --  0.84  CALCIUM  8.7*  --    < > 8.8*   < > 8.6* 8.6* 8.6* 8.5*  --  8.4*  MG 2.4  --    < > 2.2  --   --  2.1  --  2.1 2.2 2.2  PHOS 1.5* 3.7  --   --   --   --  2.3*  --  2.7 3.3  --    < > = values in this interval not displayed.    CBC: Recent Labs  Lab 06/07/24 1900 06/08/24 0758 06/09/24 0831 06/10/24 0446 06/11/24 0418 06/12/24 0425  WBC 14.4* 9.0 9.4 10.4 14.9* 13.8*  NEUTROABS 13.6*  --   --   --   --   --   HGB 12.3* 12.3* 12.0* 12.2* 13.4 12.7*  HCT 38.6* 39.0 36.9* 38.1* 41.8 38.5*  MCV 98.2 99.2 97.6 97.2 97.7 95.8  PLT 250 253 282 320 367 328    LFT Recent Labs  Lab 06/07/24 1900 06/08/24 1351 06/10/24 0446  AST 18 17 19   ALT 27 24 28   ALKPHOS 55 51 55  BILITOT 1.8* 1.4* 1.2   PROT 6.1* 6.2* 5.9*  ALBUMIN 2.8* 2.8* 2.8*     Antibiotics: Anti-infectives (From admission, onward)    Start  Dose/Rate Route Frequency Ordered Stop   06/08/24 1615  Ampicillin-Sulbactam (UNASYN) 3 g in sodium chloride 0.9 % 100 mL IVPB        3 g 200 mL/hr over 30 Minutes Intravenous Every 8 hours 06/08/24 1521 06/12/24 2359   06/02/24 1230  azithromycin  (ZITHROMAX ) tablet 500 mg        500 mg Per Tube Daily 06/02/24 1134 06/04/24 0842   06/02/24 1000  cefTRIAXone (ROCEPHIN) 1 g in sodium chloride 0.9 % 100 mL IVPB  Status:  Discontinued        1 g 200 mL/hr over 30 Minutes Intravenous Every 24 hours 06/02/24 0139 06/02/24 1134   06/01/24 2130  vancomycin (VANCOCIN) IVPB 1000 mg/200 mL premix        1,000 mg 200 mL/hr over 60 Minutes Intravenous  Once 06/01/24 2116 06/01/24 2307   06/01/24 2130  ceFEPIme (MAXIPIME) 2 g in sodium chloride 0.9 % 100 mL IVPB        2 g 200 mL/hr over 30 Minutes Intravenous  Once 06/01/24 2116 06/01/24 2210        DVT prophylaxis: Lovenox  Code Status: Full code  Family Communication: Discussed with patient's wife at bedside   CONSULTS palliative care   Subjective   This morning patient was tachypneic.   Objective    Physical Examination:  Appears in mild respiratory distress S1-S2, regular Lungs-decreased breath sounds at lung bases Extremities no edema  Status is: Inpatient:             Martin Ford   [facility] If 7PM-7AM, please contact night-coverage at [email or web address], Office  [phone]   06/12/2024, 8:23 AM  LOS: 11 days

## 2024-06-12 NOTE — Progress Notes (Signed)
 Physical Therapy Treatment Patient Details Name: Martin Ford MRN: [id number] DOB: May 01, 1936 Today's Date: 06/12/2024   History of Present Illness The pt is an 88 yo male presenting to [facility] 9/29 for SOB and progressive AMS. Pt intubated 9/29 and transferred to [facility] 9/30, extubated 10/3. DVT found 10/3. PMH includes: COPD stage 3 chronically on 2L O2, HTN, HLD, hypothyroidism, PVD, and essential tremor.    PT Comments  The pt was more alert and conversational this afternoon, able to follow commands more consistently and answer questions appropriately. Pt does continue to need increased time and benefit from simple commands for mobility. He continues to present with RLE weakness both at ankle and knee, pt reports this is baseline but he was unable to maintain standing due to frequent R knee buckling or hyperextension, as well as impaired strength in hips to power up and extend fully. Session focused on standing tolerance with VSS. Pt fatigued but making slow, steady progress. Recommendations remain appropriate, will continue efforts acutely.     If plan is discharge home, recommend the following: Two people to help with walking and/or transfers;Two people to help with bathing/dressing/bathroom;Assistance with cooking/housework;Assistance with feeding;Direct supervision/assist for medications management;Direct supervision/assist for financial management;Assist for transportation;Help with stairs or ramp for entrance;Supervision due to cognitive status   Can travel by private vehicle     No  Equipment Recommendations  Wheelchair (measurements PT);Wheelchair cushion (measurements PT)    Recommendations for Other Services       Precautions / Restrictions Precautions Precautions: Fall Recall of Precautions/Restrictions: Impaired Precaution/Restrictions Comments: B mitts, alarm belt in bed Restrictions Weight Bearing Restrictions Per Provider Order: No     Mobility  Bed  Mobility Overal bed mobility: Needs Assistance Bed Mobility: Supine to Sit, Sit to Supine     Supine to sit: Mod assist, HOB elevated Sit to supine: Min assist, +2 for safety/equipment   General bed mobility comments: pt able to assist with LE, minA with pt assist to elevate trunk. minA of 2 for safety with lines to return to bed    Transfers Overall transfer level: Needs assistance Equipment used: 2 person hand held assist Transfers: Sit to/from Stand Sit to Stand: Max assist, +2 physical assistance, Mod assist           General transfer comment: maxA of 2 initially, then modA of 2 on 2nd attempt with use of bed pad to assist with hip extension. R knee hyperextended against bed    Ambulation/Gait               General Gait Details: unable to step wtih RLE       Balance Overall balance assessment: Needs assistance Sitting-balance support: Feet supported Sitting balance-Leahy Scale: Good Sitting balance - Comments: can static sit EOB without support   Standing balance support: Bilateral upper extremity supported Standing balance-Leahy Scale: Zero Standing balance comment: dependent on BUE support, R knee hyperextended on bed or buckling, intermittent buckling L knee.                            Communication Communication Communication: Impaired Factors Affecting Communication: Hearing impaired;Reduced clarity of speech  Cognition Arousal: Alert Behavior During Therapy: Flat affect   PT - Cognitive impairments: Awareness, Memory, Attention, Initiation, Sequencing, Problem solving, Safety/Judgement                       PT - Cognition Comments: pt needed frequent  reminders about catheter before pt using appropriately. able to answer orientation questions more appropriately and give more details on PMH. Pt does benefit from increased time and simple cues still Following commands: Impaired Following commands impaired: Follows one step  commands inconsistently, Follows one step commands with increased time    Cueing Cueing Techniques: Verbal cues, Gestural cues, Tactile cues  Exercises General Exercises - Lower Extremity Heel Slides: AROM, Both, 10 reps, Supine    General Comments General comments (skin integrity, edema, etc.): RR with max of 55 briefly, but returned to 20-30 by end of session. HR max 146bpm, BP stable      Pertinent Vitals/Pain Pain Assessment Pain Assessment: No/denies pain Pain Intervention(s): Monitored during session     PT Goals (current goals can now be found in the care plan section) Acute Rehab PT Goals Patient Stated Goal: none stated PT Goal Formulation: With patient Time For Goal Achievement: 06/22/24 Potential to Achieve Goals: Fair Progress towards PT goals: Progressing toward goals    Frequency    Min 2X/week       AM-PAC PT 6 Clicks Mobility   Outcome Measure  Help needed turning from your back to your side while in a flat bed without using bedrails?: A Lot Help needed moving from lying on your back to sitting on the side of a flat bed without using bedrails?: A Lot Help needed moving to and from a bed to a chair (including a wheelchair)?: Total Help needed standing up from a chair using your arms (e.g., wheelchair or bedside chair)?: A Lot Help needed to walk in hospital room?: Total Help needed climbing 3-5 steps with a railing? : Total 6 Click Score: 9    End of Session Equipment Utilized During Treatment: Gait belt;Oxygen  Activity Tolerance: Patient tolerated treatment well Patient left: with call bell/phone within reach;with family/visitor present;in bed;with bed alarm set;with restraints reapplied Nurse Communication: Mobility status;Need for lift equipment (stedy) PT Visit Diagnosis: Unsteadiness on feet (R26.81);Other abnormalities of gait and mobility (R26.89);Muscle weakness (generalized) (M62.81)     Time: 1534-1600 PT Time Calculation (min) (ACUTE  ONLY): 26 min  Charges:    $Therapeutic Exercise: 8-22 mins $Therapeutic Activity: 8-22 mins PT General Charges $$ ACUTE PT VISIT: 1 Visit                     Izetta Call, PT, DPT   Acute Rehabilitation Department Office [phone] Secure Chat Communication Preferred   Izetta JULIANNA Call 06/12/2024, 4:42 PM

## 2024-06-12 NOTE — Progress Notes (Signed)
 Patient in no respiratory distress at this time. 3LPM=98% bipap not needed at this time.

## 2024-06-12 NOTE — Progress Notes (Signed)
 Cortrak Tube Team Note:  Consult received to evaluate a Cortrak feeding tube that has been stretched on the end. RD retraced and flushed tube with no issues. Tube not leaking at this time. RN may begin using tube again.  If the tube becomes dislodged please keep the tube and contact the Cortrak team at [email or web address] for replacement.  If after hours and replacement cannot be delayed, place a NG tube and confirm placement with an abdominal x-ray.   Olivia Kenning, RD Registered Dietitian  See Amion for more information

## 2024-06-12 NOTE — TOC Progression Note (Signed)
 Transition of Care [facility]) - Progression Note    Patient Details  Name: Martin Ford MRN: [id number] Date of Birth: 07-24-1936  [facility]) CM/SW Contact  Isaiah Public, LCSWA Phone Number: 06/12/2024, 12:35 PM  Clinical Narrative:     CSW following to fax patient out for SNF closer to patient being medically ready for dc. CSW will continue to follow.   Expected Discharge Plan: Skilled Nursing Facility Barriers to Discharge: Continued Medical Work up               Expected Discharge Plan and Services In-house Referral: Clinical Social Work     Living arrangements for the past 2 months: Single Family Home                                       Social Drivers of Health (SDOH) Interventions SDOH Screenings   Food Insecurity: No Food Insecurity (06/01/2022)  Housing: Low Risk  (06/06/2024)  Transportation Needs: No Transportation Needs (06/06/2024)  Utilities: Patient Unable To Answer (06/03/2024)  Alcohol Screen: Low Risk  (06/01/2022)  Depression (PHQ2-9): Low Risk  (02/21/2024)  Financial Resource Strain: Low Risk  (06/01/2022)  Physical Activity: Inactive (06/01/2022)  Social Connections: Unknown (06/01/2022)  Stress: No Stress Concern Present (06/01/2022)  Tobacco Use: Medium Risk (06/02/2024)    Readmission Risk Interventions     No data to display

## 2024-06-13 DIAGNOSIS — J449 Chronic obstructive pulmonary disease, unspecified: Secondary | ICD-10-CM | POA: Diagnosis not present

## 2024-06-13 DIAGNOSIS — J9621 Acute and chronic respiratory failure with hypoxia: Secondary | ICD-10-CM | POA: Diagnosis not present

## 2024-06-13 DIAGNOSIS — E440 Moderate protein-calorie malnutrition: Secondary | ICD-10-CM | POA: Diagnosis not present

## 2024-06-13 LAB — GLUCOSE, CAPILLARY
Glucose-Capillary: 111 mg/dL — ABNORMAL HIGH (ref 70–99)
Glucose-Capillary: 112 mg/dL — ABNORMAL HIGH (ref 70–99)
Glucose-Capillary: 128 mg/dL — ABNORMAL HIGH (ref 70–99)
Glucose-Capillary: 138 mg/dL — ABNORMAL HIGH (ref 70–99)
Glucose-Capillary: 157 mg/dL — ABNORMAL HIGH (ref 70–99)
Glucose-Capillary: 165 mg/dL — ABNORMAL HIGH (ref 70–99)

## 2024-06-13 MED ORDER — OSMOLITE 1.2 CAL PO LIQD
1080.0000 mL | Freq: Every day | ORAL | Status: DC
  Administered 2024-06-13 – 2024-06-14 (×2): 1080 mL
  Filled 2024-06-13 (×2): qty 2000
  Filled 2024-06-13: qty 1185

## 2024-06-13 MED ORDER — FREE WATER
150.0000 mL | Freq: Every day | Status: DC
  Administered 2024-06-14 – 2024-06-18 (×4): 150 mL

## 2024-06-13 NOTE — Plan of Care (Signed)
  Problem: Education: Goal: Knowledge of General Education information will improve Description: Including pain rating scale, medication(s)/side effects and non-pharmacologic comfort measures Outcome: Progressing   Problem: Health Behavior/Discharge Planning: Goal: Ability to manage health-related needs will improve Outcome: Progressing   Problem: Clinical Measurements: Goal: Ability to maintain clinical measurements within normal limits will improve Outcome: Progressing Goal: Will remain free from infection Outcome: Progressing Goal: Diagnostic test results will improve Outcome: Progressing Goal: Respiratory complications will improve Outcome: Progressing Goal: Cardiovascular complication will be avoided Outcome: Progressing   Problem: Activity: Goal: Risk for activity intolerance will decrease Outcome: Progressing   Problem: Nutrition: Goal: Adequate nutrition will be maintained Outcome: Progressing   Problem: Coping: Goal: Level of anxiety will decrease Outcome: Progressing   Problem: Elimination: Goal: Will not experience complications related to bowel motility Outcome: Progressing Goal: Will not experience complications related to urinary retention Outcome: Progressing   Problem: Pain Managment: Goal: General experience of comfort will improve and/or be controlled Outcome: Progressing   Problem: Safety: Goal: Ability to remain free from injury will improve Outcome: Progressing   Problem: Skin Integrity: Goal: Risk for impaired skin integrity will decrease Outcome: Progressing   Problem: Safety: Goal: Non-violent Restraint(s) Outcome: Progressing   Problem: Education: Goal: Ability to describe self-care measures that may prevent or decrease complications (Diabetes Survival Skills Education) will improve Outcome: Progressing Goal: Individualized Educational Video(s) Outcome: Progressing   Problem: Coping: Goal: Ability to adjust to condition or change  in health will improve Outcome: Progressing   Problem: Fluid Volume: Goal: Ability to maintain a balanced intake and output will improve Outcome: Progressing   Problem: Health Behavior/Discharge Planning: Goal: Ability to identify and utilize available resources and services will improve Outcome: Progressing Goal: Ability to manage health-related needs will improve Outcome: Progressing   Problem: Metabolic: Goal: Ability to maintain appropriate glucose levels will improve Outcome: Progressing   Problem: Nutritional: Goal: Maintenance of adequate nutrition will improve Outcome: Progressing Goal: Progress toward achieving an optimal weight will improve Outcome: Progressing   Problem: Skin Integrity: Goal: Risk for impaired skin integrity will decrease Outcome: Progressing   Problem: Tissue Perfusion: Goal: Adequacy of tissue perfusion will improve Outcome: Progressing

## 2024-06-13 NOTE — Progress Notes (Signed)
 Bipap ordered prn at this time. Patient appears to be in no distress.

## 2024-06-13 NOTE — Progress Notes (Signed)
 [facility]  PROGRESS NOTE  Martin Ford FMW:[id number] DOB: Aug 04, 1936 DOA: 06/01/2024 PCP: Tobie Suzzane POUR, MD   Brief HPI:    Patient with PMH of COPD, GERD, chronic respiratory failure on 2 LPM, aortic stenosis presented to the hospital with complaints of confusion and shortness of breath. He sees Dr. Darlean and just saw him as an outpatient on 05/14/24 and was prescribed a prednisone  taper and 10 day course of Doxycycline .   On 9/29, he had worsening dyspnea despite increase in O2 from baseline 2L to 4L. He was taken to [facility] ED where he required intubation. He was given 1 dose of Vancomycin and Cefepime empirically.   CTA chest demonstrated Bronchiectasis, no PE. CT head demonstrated no acute issues, generalized atrophy and sinus thickening.   He was later transferred to [facility] for further management.   Assessment/Plan:   Acute on chronic hypoxic/hypercapnic respiratory failure 2/2 AECOPD - COPD Aspiration pneumonia. Required intubation on 9/29. Uses 2 LPM chronically at home. Currently on 2 LPM. Was on BiPAP after extubation. Patient appears to be having very poor cough. Difficulty use BiPAP in the setting of Cortrak. Completed IV Unasyn on 06/12/2024  Continue Solu-Medrol and nebulizer therapy. - Status post 1 dose of Lasix 20 mg IV with good diuretic response   AKI: Resolved   Hypernatremia. From poor p.o. intake. Treated with free water and D10. Continue free water.   Mitral valve prolapse. Moderate aortic stenosis. Moderate aortic regurgitation. ICU discussed with cardiologist Dr  Ren Maha.  Recommended to follow as an outpatient This renders very poor prognosis for the patient's current presentation though.   Hyperglycemia/hypoglycemia Hyperglycemia presumed 2/2 steroids.  Later on become hypoglycemic as steroids are weaned and tube feeds are stopped. Treated with D10.  Now on Cortrak.     Dysphagia. Poor p.o. intake. Secondary to patient's ongoing  delirium patient is not a good candidate for swallow. SLP has been following but patient has not been able to pass swallow evaluation. Suspect prognosis is poor. There is also concern for aspiration pneumonia for which the patient is on antibiotics.   Moderate protein calorie malnutrition Dietitian consulting. Cortrak placed. Initiating tube feed.   Acute bilateral distal DVT. CTPE negative for PE. Lower extremity Doppler on 10/3 positive for bilateral distal DVT No significant edema. Initially was on Eliquis.  Now on Lovenox.   Diarrhea: Likely associated with tube feeding.  May need fiber supplements. Currently no GI symptoms. No further workup.   Delirium. Patient is somewhat confused and agitated. Required Precedex in the ICU. Mental status has improved  Continue Seroquel.   Goals of care conversation. Discussed with wife at bedside. Patient has a living will that states that if he is not evaluated to state he would like to be resuscitated. But in a vegetative state he would not like to be kept alive artificially in machine. Palliative care consulted.  Patient's prognosis appears to be poor. Monitor.   Moderate protein calorie malnutrition Body mass index is 19.34 kg/m.  Continue supplement. Renders poor prognosis for the patient.   Hypertension. Blood pressure elevated. As needed hydralazine.     Medications     vitamin C  1,000 mg Per Tube Q8H   budesonide (PULMICORT) nebulizer solution  0.25 mg Nebulization BID   Chlorhexidine Gluconate Cloth  6 each Topical Daily   enoxaparin (LOVENOX) injection  70 mg Subcutaneous Q12H   famotidine   20 mg Per Tube Daily   feeding supplement (OSMOLITE 1.2 CAL)  1,080 mL  Per Tube QHS   feeding supplement (PROSource TF20)  60 mL Per Tube Daily   [START ON 06/14/2024] free water  150 mL Per Tube QHS   guaiFENesin  400 mg Per Tube Q8H   methylPREDNISolone (SOLU-MEDROL) injection  40 mg Intravenous Q24H   QUEtiapine  25  mg Oral QPM     Data Reviewed:   CBG:  Recent Labs  Lab 06/12/24 2033 06/13/24 0010 06/13/24 0356 06/13/24 0756 06/13/24 1306  GLUCAP 171* 157* 165* 138* 111*    SpO2: 100 % O2 Flow Rate (L/min): 3 L/min FiO2 (%): 32 %    Vitals:   06/12/24 2336 06/13/24 0349 06/13/24 0758 06/13/24 0835  BP: 127/63 128/62  (!) 114/52  Pulse: 62 82 (!) 56   Resp: 18 (!) 22 20   Temp: (!) 97.5 F (36.4 C) 98 F (36.7 C)  98.3 F (36.8 C)  TempSrc: Oral Oral  Oral  SpO2: 98% 95% 100%   Weight:  68.8 kg    Height:          Data Reviewed:  Basic Metabolic Panel: Recent Labs  Lab 06/06/24 2026 06/07/24 1900 06/08/24 0758 06/08/24 1351 06/08/24 2338 06/09/24 0831 06/09/24 1124 06/10/24 0446 06/11/24 0418 06/12/24 0425  NA  --    < > 147*   < > 147* 148* 145 143  --  141  K  --    < > 3.8   < > 3.8 3.7 3.5 3.4*  --  3.7  CL  --    < > 106   < > 104 106 104 104  --  101  CO2  --    < > 32   < > 32 30 28 30   --  30  GLUCOSE  --    < > 147*   < > 170* 197* 156* 213*  --  157*  BUN  --    < > 45*   < > 43* 42* 44* 40*  --  39*  CREATININE  --    < > 0.85   < > 0.89 0.77 0.79 0.83  --  0.84  CALCIUM   --    < > 8.8*   < > 8.6* 8.6* 8.6* 8.5*  --  8.4*  MG  --    < > 2.2  --   --  2.1  --  2.1 2.2 2.2  PHOS 3.7  --   --   --   --  2.3*  --  2.7 3.3  --    < > = values in this interval not displayed.    CBC: Recent Labs  Lab 06/07/24 1900 06/08/24 0758 06/09/24 0831 06/10/24 0446 06/11/24 0418 06/12/24 0425  WBC 14.4* 9.0 9.4 10.4 14.9* 13.8*  NEUTROABS 13.6*  --   --   --   --   --   HGB 12.3* 12.3* 12.0* 12.2* 13.4 12.7*  HCT 38.6* 39.0 36.9* 38.1* 41.8 38.5*  MCV 98.2 99.2 97.6 97.2 97.7 95.8  PLT 250 253 282 320 367 328    LFT Recent Labs  Lab 06/07/24 1900 06/08/24 1351 06/10/24 0446  AST 18 17 19   ALT 27 24 28   ALKPHOS 55 51 55  BILITOT 1.8* 1.4* 1.2  PROT 6.1* 6.2* 5.9*  ALBUMIN 2.8* 2.8* 2.8*     Antibiotics: Anti-infectives (From admission,  onward)    Start     Dose/Rate Route Frequency Ordered Stop   06/08/24 1615  Ampicillin-Sulbactam (UNASYN)  3 g in sodium chloride 0.9 % 100 mL IVPB        3 g 200 mL/hr over 30 Minutes Intravenous Every 8 hours 06/08/24 1521 06/12/24 1632   06/02/24 1230  azithromycin  (ZITHROMAX ) tablet 500 mg        500 mg Per Tube Daily 06/02/24 1134 06/04/24 0842   06/02/24 1000  cefTRIAXone (ROCEPHIN) 1 g in sodium chloride 0.9 % 100 mL IVPB  Status:  Discontinued        1 g 200 mL/hr over 30 Minutes Intravenous Every 24 hours 06/02/24 0139 06/02/24 1134   06/01/24 2130  vancomycin (VANCOCIN) IVPB 1000 mg/200 mL premix        1,000 mg 200 mL/hr over 60 Minutes Intravenous  Once 06/01/24 2116 06/01/24 2307   06/01/24 2130  ceFEPIme (MAXIPIME) 2 g in sodium chloride 0.9 % 100 mL IVPB        2 g 200 mL/hr over 30 Minutes Intravenous  Once 06/01/24 2116 06/01/24 2210        DVT prophylaxis: Lovenox  Code Status: Full code  Family Communication: Discussed with patient's wife at bedside   CONSULTS palliative care   Subjective   Breathing is improved.  Diuresed well with IV Lasix.  Found to have urinary retention.  Foley catheter placed.   Objective    Physical Examination:  Appears in no acute distress S1-S2, regular, no murmur auscultated Lungs are clear to auscultation bilaterally Abdomen is soft  Status is: Inpatient:         Martin Ford   [facility] If 7PM-7AM, please contact night-coverage at [email or web address], Office  [phone]   06/13/2024, 4:31 PM  LOS: 12 days

## 2024-06-13 NOTE — TOC Progression Note (Signed)
 Transition of Care [facility]) - Progression Note    Patient Details  Name: Martin Ford MRN: [id number] Date of Birth: 12-Jan-1936  [facility] CM/SW Contact  Isaiah Public, LCSWA Phone Number: 06/13/2024, 3:20 PM  Clinical Narrative:     CSW following to fax patient out for SNF closer to patient being medically ready for dc. CSW will continue to follow.   Expected Discharge Plan: Skilled Nursing Facility Barriers to Discharge: Continued Medical Work up               Expected Discharge Plan and Services In-house Referral: Clinical Social Work     Living arrangements for the past 2 months: Single Family Home                                       Social Drivers of Health (SDOH) Interventions SDOH Screenings   Food Insecurity: No Food Insecurity (06/01/2022)  Housing: Low Risk  (06/06/2024)  Transportation Needs: No Transportation Needs (06/06/2024)  Utilities: Patient Unable To Answer (06/03/2024)  Alcohol Screen: Low Risk  (06/01/2022)  Depression (PHQ2-9): Low Risk  (02/21/2024)  Financial Resource Strain: Low Risk  (06/01/2022)  Physical Activity: Inactive (06/01/2022)  Social Connections: Unknown (06/01/2022)  Stress: No Stress Concern Present (06/01/2022)  Tobacco Use: Medium Risk (06/02/2024)    Readmission Risk Interventions     No data to display

## 2024-06-14 DIAGNOSIS — E440 Moderate protein-calorie malnutrition: Secondary | ICD-10-CM | POA: Diagnosis not present

## 2024-06-14 DIAGNOSIS — J449 Chronic obstructive pulmonary disease, unspecified: Secondary | ICD-10-CM | POA: Diagnosis not present

## 2024-06-14 DIAGNOSIS — J9621 Acute and chronic respiratory failure with hypoxia: Secondary | ICD-10-CM | POA: Diagnosis not present

## 2024-06-14 LAB — GLUCOSE, CAPILLARY
Glucose-Capillary: 105 mg/dL — ABNORMAL HIGH (ref 70–99)
Glucose-Capillary: 111 mg/dL — ABNORMAL HIGH (ref 70–99)
Glucose-Capillary: 116 mg/dL — ABNORMAL HIGH (ref 70–99)
Glucose-Capillary: 123 mg/dL — ABNORMAL HIGH (ref 70–99)
Glucose-Capillary: 154 mg/dL — ABNORMAL HIGH (ref 70–99)
Glucose-Capillary: 191 mg/dL — ABNORMAL HIGH (ref 70–99)

## 2024-06-14 NOTE — Progress Notes (Signed)
 Patient attempting to get out of bed multiple times since start of shift stating he was ready to go.  Patient has also removed PIV at the beginning of shift.  PIV replaced by off going RN.  Shasta cola and applesauce offered to patient and he had a small amount of each. Patient given 2 mg of haldol per prn orders after multiple attempts to redirect. Patient currently lying in bed awake, call button within reach, bed alarm on, floor mats in place and door open for supervision.

## 2024-06-14 NOTE — Progress Notes (Signed)
 [facility]  PROGRESS NOTE  Martin Ford FMW:[id number] DOB: 03/23/1936 DOA: 06/01/2024 PCP: Tobie Suzzane POUR, MD   Brief HPI:    Patient with PMH of COPD, GERD, chronic respiratory failure on 2 LPM, aortic stenosis presented to the hospital with complaints of confusion and shortness of breath. He sees Dr. Darlean and just saw him as an outpatient on 05/14/24 and was prescribed a prednisone  taper and 10 day course of Doxycycline .   On 9/29, he had worsening dyspnea despite increase in O2 from baseline 2L to 4L. He was taken to [facility] ED where he required intubation. He was given 1 dose of Vancomycin and Cefepime empirically.   CTA chest demonstrated Bronchiectasis, no PE. CT head demonstrated no acute issues, generalized atrophy and sinus thickening.   He was later transferred to [facility] for further management.   Assessment/Plan:   Acute on chronic hypoxic/hypercapnic respiratory failure 2/2 AECOPD - COPD Aspiration pneumonia. Required intubation on 9/29. Uses 2 LPM chronically at home. Currently on 2 LPM. Was on BiPAP after extubation. Patient appears to be having very poor cough. Difficulty use BiPAP in the setting of Cortrak. Completed IV Unasyn on 06/12/2024  Continue Solu-Medrol and nebulizer therapy. - Status post 1 dose of Lasix 20 mg IV with good diuretic response   AKI: Resolved   Hypernatremia. From poor p.o. intake. Treated with free water and D10. Continue free water.   Mitral valve prolapse. Moderate aortic stenosis. Moderate aortic regurgitation. ICU discussed with cardiologist Dr  Ren Maha.  Recommended to follow as an outpatient This renders very poor prognosis for the patient's current presentation though.   Hyperglycemia/hypoglycemia Hyperglycemia presumed 2/2 steroids.  Later on become hypoglycemic as steroids are weaned and tube feeds are stopped. Treated with D10.  Now on Cortrak.     Dysphagia. Poor p.o. intake. Secondary to patient's ongoing  delirium patient is not a good candidate for swallow. SLP has been following but patient has not been able to pass swallow evaluation. -Started on dysphagia 3 diet -Have changed the tube feedings to nighttime only to increase p.o. intake during daytime   Moderate protein calorie malnutrition Dietitian consulting. Cortrak placed. Tube feeding changed to nocturnal only to increase p.o. intake during daytime   Acute bilateral distal DVT. CTPE negative for PE. Lower extremity Doppler on 10/3 positive for bilateral distal DVT No significant edema. Initially was on Eliquis.  Now on Lovenox.   Diarrhea: Likely associated with tube feeding.  May need fiber supplements. Currently no GI symptoms. No further workup.   Delirium. Patient is somewhat confused and agitated. Required Precedex in the ICU. Mental status has improved  Continue Seroquel.   Goals of care conversation. Discussed with wife at bedside. Patient has a living will that states that if he is not evaluated to state he would like to be resuscitated. But in a vegetative state he would not like to be kept alive artificially in machine. Palliative care consulted.  Patient's prognosis appears to be poor. Monitor.   Moderate protein calorie malnutrition Body mass index is 19.34 kg/m.  Continue supplement. Renders poor prognosis for the patient.   Hypertension. Blood pressure elevated. As needed hydralazine.     Medications     vitamin C  1,000 mg Per Tube Q8H   budesonide (PULMICORT) nebulizer solution  0.25 mg Nebulization BID   Chlorhexidine Gluconate Cloth  6 each Topical Daily   enoxaparin (LOVENOX) injection  70 mg Subcutaneous Q12H   famotidine   20 mg Per Tube Daily  feeding supplement (OSMOLITE 1.2 CAL)  1,080 mL Per Tube QHS   feeding supplement (PROSource TF20)  60 mL Per Tube Daily   free water  150 mL Per Tube QHS   guaiFENesin  400 mg Per Tube Q8H   methylPREDNISolone (SOLU-MEDROL) injection  40 mg  Intravenous Q24H   QUEtiapine  25 mg Oral QPM     Data Reviewed:   CBG:  Recent Labs  Lab 06/13/24 1720 06/13/24 1938 06/14/24 0013 06/14/24 0344 06/14/24 0738  GLUCAP 128* 112* 123* 116* 105*    SpO2: 97 % O2 Flow Rate (L/min): 3 L/min FiO2 (%): 32 %    Vitals:   06/13/24 1936 06/14/24 0012 06/14/24 0345 06/14/24 0736  BP: (!) 140/62 138/62 (!) 151/60 (!) 152/52  Pulse: 61 60 60   Resp: 16 19 (!) 21   Temp: 97.8 F (36.6 C) 98.2 F (36.8 C) 98.1 F (36.7 C) (!) 97.5 F (36.4 C)  TempSrc: Oral Oral Oral Oral  SpO2: 100% 98% 97%   Weight:   69.1 kg   Height:          Data Reviewed:  Basic Metabolic Panel: Recent Labs  Lab 06/08/24 0758 06/08/24 1351 06/08/24 2338 06/09/24 0831 06/09/24 1124 06/10/24 0446 06/11/24 0418 06/12/24 0425  NA 147*   < > 147* 148* 145 143  --  141  K 3.8   < > 3.8 3.7 3.5 3.4*  --  3.7  CL 106   < > 104 106 104 104  --  101  CO2 32   < > 32 30 28 30   --  30  GLUCOSE 147*   < > 170* 197* 156* 213*  --  157*  BUN 45*   < > 43* 42* 44* 40*  --  39*  CREATININE 0.85   < > 0.89 0.77 0.79 0.83  --  0.84  CALCIUM  8.8*   < > 8.6* 8.6* 8.6* 8.5*  --  8.4*  MG 2.2  --   --  2.1  --  2.1 2.2 2.2  PHOS  --   --   --  2.3*  --  2.7 3.3  --    < > = values in this interval not displayed.    CBC: Recent Labs  Lab 06/07/24 1900 06/08/24 0758 06/09/24 0831 06/10/24 0446 06/11/24 0418 06/12/24 0425  WBC 14.4* 9.0 9.4 10.4 14.9* 13.8*  NEUTROABS 13.6*  --   --   --   --   --   HGB 12.3* 12.3* 12.0* 12.2* 13.4 12.7*  HCT 38.6* 39.0 36.9* 38.1* 41.8 38.5*  MCV 98.2 99.2 97.6 97.2 97.7 95.8  PLT 250 253 282 320 367 328    LFT Recent Labs  Lab 06/07/24 1900 06/08/24 1351 06/10/24 0446  AST 18 17 19   ALT 27 24 28   ALKPHOS 55 51 55  BILITOT 1.8* 1.4* 1.2  PROT 6.1* 6.2* 5.9*  ALBUMIN 2.8* 2.8* 2.8*     Antibiotics: Anti-infectives (From admission, onward)    Start     Dose/Rate Route Frequency Ordered Stop    06/08/24 1615  Ampicillin-Sulbactam (UNASYN) 3 g in sodium chloride 0.9 % 100 mL IVPB        3 g 200 mL/hr over 30 Minutes Intravenous Every 8 hours 06/08/24 1521 06/12/24 1632   06/02/24 1230  azithromycin  (ZITHROMAX ) tablet 500 mg        500 mg Per Tube Daily 06/02/24 1134 06/04/24 0842   06/02/24 1000  cefTRIAXone (ROCEPHIN) 1 g in sodium chloride 0.9 % 100 mL IVPB  Status:  Discontinued        1 g 200 mL/hr over 30 Minutes Intravenous Every 24 hours 06/02/24 0139 06/02/24 1134   06/01/24 2130  vancomycin (VANCOCIN) IVPB 1000 mg/200 mL premix        1,000 mg 200 mL/hr over 60 Minutes Intravenous  Once 06/01/24 2116 06/01/24 2307   06/01/24 2130  ceFEPIme (MAXIPIME) 2 g in sodium chloride 0.9 % 100 mL IVPB        2 g 200 mL/hr over 30 Minutes Intravenous  Once 06/01/24 2116 06/01/24 2210        DVT prophylaxis: Lovenox  Code Status: Full code  Family Communication: Discussed with patient's wife at bedside   CONSULTS palliative care   Subjective   Patient seen and examined, denies shortness of breath  Objective    Physical Examination:  General-appears in no acute distress Heart-S1-S2, regular, no murmur auscultated Lungs-clear to auscultation bilaterally, no wheezing or crackles auscultated Abdomen-soft, nontender, no organomegaly Extremities-no edema in the lower extremities Neuro-alert, oriented x3, no focal deficit noted  Status is: Inpatient:         Sabas GORMAN Brod   [facility] If 7PM-7AM, please contact night-coverage at [email or web address], Office  [phone]   06/14/2024, 8:27 AM  LOS: 13 days

## 2024-06-15 ENCOUNTER — Telehealth (HOSPITAL_COMMUNITY): Payer: Self-pay | Admitting: Pharmacy Technician

## 2024-06-15 ENCOUNTER — Other Ambulatory Visit (HOSPITAL_COMMUNITY): Payer: Self-pay

## 2024-06-15 ENCOUNTER — Ambulatory Visit: Admitting: Internal Medicine

## 2024-06-15 DIAGNOSIS — E440 Moderate protein-calorie malnutrition: Secondary | ICD-10-CM | POA: Diagnosis not present

## 2024-06-15 DIAGNOSIS — J9621 Acute and chronic respiratory failure with hypoxia: Secondary | ICD-10-CM | POA: Diagnosis not present

## 2024-06-15 DIAGNOSIS — J449 Chronic obstructive pulmonary disease, unspecified: Secondary | ICD-10-CM | POA: Diagnosis not present

## 2024-06-15 LAB — GLUCOSE, CAPILLARY
Glucose-Capillary: 121 mg/dL — ABNORMAL HIGH (ref 70–99)
Glucose-Capillary: 123 mg/dL — ABNORMAL HIGH (ref 70–99)
Glucose-Capillary: 123 mg/dL — ABNORMAL HIGH (ref 70–99)
Glucose-Capillary: 137 mg/dL — ABNORMAL HIGH (ref 70–99)
Glucose-Capillary: 142 mg/dL — ABNORMAL HIGH (ref 70–99)
Glucose-Capillary: 145 mg/dL — ABNORMAL HIGH (ref 70–99)

## 2024-06-15 MED ORDER — LORAZEPAM 2 MG/ML IJ SOLN
0.5000 mg | Freq: Once | INTRAMUSCULAR | Status: AC
  Administered 2024-06-15: 0.5 mg via INTRAVENOUS
  Filled 2024-06-15: qty 1

## 2024-06-15 MED ORDER — APIXABAN 5 MG PO TABS
5.0000 mg | ORAL_TABLET | Freq: Two times a day (BID) | ORAL | Status: DC
  Administered 2024-06-15 – 2024-06-20 (×10): 5 mg
  Filled 2024-06-15 (×10): qty 1

## 2024-06-15 MED ORDER — HALOPERIDOL LACTATE 5 MG/ML IJ SOLN
2.0000 mg | Freq: Once | INTRAMUSCULAR | Status: AC
  Administered 2024-06-15: 2 mg via INTRAVENOUS
  Filled 2024-06-15: qty 1

## 2024-06-15 MED ORDER — PROSOURCE TF20 ENFIT COMPATIBL EN LIQD: 60.0000 mL | Freq: Every day | ENTERAL | Status: DC

## 2024-06-15 MED ORDER — QUETIAPINE FUMARATE 50 MG PO TABS
50.0000 mg | ORAL_TABLET | Freq: Every evening | ORAL | Status: DC
  Administered 2024-06-15 – 2024-06-20 (×6): 50 mg via ORAL
  Filled 2024-06-15 (×8): qty 1

## 2024-06-15 MED ORDER — OSMOLITE 1.2 CAL PO LIQD
1000.0000 mL | ORAL | Status: DC
  Administered 2024-06-15: 1000 mL
  Filled 2024-06-15 (×2): qty 1000

## 2024-06-15 NOTE — Progress Notes (Signed)
 Patient continues to be oriented to self only and multiple attempts to get out of bed.  Offered food and he said he wanted a donut and coffee.  Patient ate 100% snack with milk, coffee provided and declined.  Continued to try to pick at IV, pulse ox probe, and reach for coretrak, mitts applied after snack and he continued to ask for a knife to cut them off and get up.  Contacted Dr. Segars with 2 mg additional IV haldol ordered and given.  Patient bathed and currently awake, lying in bed with HOB 30 degrees, tube feed infusing per orders.

## 2024-06-15 NOTE — Telephone Encounter (Signed)
 Patient Product/process development scientist completed.    The patient is insured through [facility] [employer]. Patient has Medicare and is not eligible for a copay card, but may be able to apply for patient assistance or Medicare RX Payment Plan (Patient Must reach out to their plan, if eligible for payment plan), if available.    Ran test claim for Eliquis 5 mg and the current 30 day co-pay is $47.00.   This test claim was processed through [facility]- copay amounts may vary at other pharmacies due to pharmacy/plan contracts, or as the patient moves through the different stages of their insurance plan.     Reyes Sharps, CPHT Pharmacy Technician Patient Advocate Specialist Lead [facility] Patient Advocate Team Direct Number: [phone]  Fax: [phone]

## 2024-06-15 NOTE — TOC Progression Note (Signed)
 Transition of Care [facility]) - Progression Note    Patient Details  Name: Martin Ford MRN: [id number] Date of Birth: 01-22-1936  Transition [facility]) CM/SW Contact  Isaiah Public, LCSWA Phone Number: 06/15/2024, 2:12 PM  Clinical Narrative:     CSW following to fax patient out for SNF closer to patient being medically ready for dc. CSW will continue to follow.   Expected Discharge Plan: Skilled Nursing Facility Barriers to Discharge: Continued Medical Work up               Expected Discharge Plan and Services In-house Referral: Clinical Social Work     Living arrangements for the past 2 months: Single Family Home                                       Social Drivers of Health (SDOH) Interventions SDOH Screenings   Food Insecurity: No Food Insecurity (06/01/2022)  Housing: Low Risk  (06/06/2024)  Transportation Needs: No Transportation Needs (06/06/2024)  Utilities: Patient Unable To Answer (06/03/2024)  Alcohol Screen: Low Risk  (06/01/2022)  Depression (PHQ2-9): Low Risk  (02/21/2024)  Financial Resource Strain: Low Risk  (06/01/2022)  Physical Activity: Inactive (06/01/2022)  Social Connections: Unknown (06/01/2022)  Stress: No Stress Concern Present (06/01/2022)  Tobacco Use: Medium Risk (06/02/2024)    Readmission Risk Interventions     No data to display

## 2024-06-15 NOTE — Progress Notes (Signed)
 Physical Therapy Treatment Patient Details Name: Martin Ford MRN: [id number] DOB: 04/09/1936 Today's Date: 06/15/2024   History of Present Illness The pt is an 88 yo male presenting to [facility] 9/29 for SOB and progressive AMS. Pt intubated 9/29 and transferred to [facility] 9/30, extubated 10/3. DVT found 10/3. PMH includes: COPD stage 3 chronically on 2L O2, HTN, HLD, hypothyroidism, PVD, and essential tremor.    PT Comments  Pt attempting to get out of bed upon arrival, sitting EOB despite lap belt alarm and bed alarm. Pt perseverating on getting to commode, not oriented or redirectable at this time. Pt needing increased assist to complete sit-stand transfers, and totalA of 2 to complete transfer to chair due to RLE weakness and impaired coordination and motor planning. Recommendation remains appropriate, will continue efforts acutely.     If plan is discharge home, recommend the following: Two people to help with walking and/or transfers;Two people to help with bathing/dressing/bathroom;Assistance with cooking/housework;Assistance with feeding;Direct supervision/assist for medications management;Direct supervision/assist for financial management;Assist for transportation;Help with stairs or ramp for entrance;Supervision due to cognitive status   Can travel by private vehicle     No  Equipment Recommendations  Wheelchair (measurements PT);Wheelchair cushion (measurements PT)    Recommendations for Other Services       Precautions / Restrictions Precautions Precautions: Fall Recall of Precautions/Restrictions: Impaired Precaution/Restrictions Comments: B mitts, alarm belt in bed, foley, rectal tube Restrictions Weight Bearing Restrictions Per Provider Order: No     Mobility  Bed Mobility Overal bed mobility: Needs Assistance Bed Mobility: Supine to Sit, Sit to Supine     Supine to sit: Contact guard Sit to supine: +2 for safety/equipment, Mod assist   General bed mobility  comments: pt climbing to EOB upon arrival despite restraints and alarms, modA to return to bed    Transfers Overall transfer level: Needs assistance Equipment used: 2 person hand held assist Transfers: Sit to/from Stand, Bed to chair/wheelchair/BSC Sit to Stand: Max assist, +2 physical assistance, Mod assist   Step pivot transfers: Max assist, Total assist, +2 physical assistance       General transfer comment: maxA of 2 with HHA, poor power, RLE in hyperextension, perseverating on getting to commode, totalA of 2 to take pivotal steps to chair    Ambulation/Gait Ambulation/Gait assistance: Total assist, +2 physical assistance Gait Distance (Feet): 3 Feet Assistive device: 2 person hand held assist Gait Pattern/deviations: Step-to pattern, Decreased stance time - right, Decreased dorsiflexion - right, Decreased weight shift to right, Knee hyperextension - right, Knee flexed in stance - right       General Gait Details: pt with R knee buckling and hyperextending, unable to advance, totalA of 2 to pivot to [facility] with assist at trunk and HHA.   Stairs             Wheelchair Mobility     Tilt Bed    Modified Rankin (Stroke Patients Only)       Balance Overall balance assessment: Needs assistance Sitting-balance support: Feet supported Sitting balance-Leahy Scale: Good Sitting balance - Comments: can static sit EOB without support   Standing balance support: Bilateral upper extremity supported Standing balance-Leahy Scale: Zero Standing balance comment: dependent on BUE support, R knee hyperextended on bed or buckling, intermittent buckling L knee.                            Communication Communication Communication: Impaired Factors Affecting Communication: Hearing impaired;Reduced  clarity of speech  Cognition Arousal: Alert Behavior During Therapy: Agitated, Impulsive   PT - Cognitive impairments: Awareness, Memory, Attention, Initiation,  Sequencing, Problem solving, Safety/Judgement, Orientation   Orientation impairments: Place, Time, Situation                   PT - Cognition Comments: pt would not answer question for oriented to self but was otherwise disoriented, thinking he was home. pt perseverating on going to the commode and not redirectable Following commands: Impaired Following commands impaired: Follows one step commands inconsistently, Follows one step commands with increased time    Cueing Cueing Techniques: Verbal cues, Gestural cues, Tactile cues  Exercises      General Comments General comments (skin integrity, edema, etc.): SpO2 stable on 2L, RR from 17 to 38 with activity      Pertinent Vitals/Pain Pain Assessment Pain Assessment: No/denies pain Faces Pain Scale: No hurt Pain Intervention(s): Monitored during session    Home Living                          Prior Function            PT Goals (current goals can now be found in the care plan section) Acute Rehab PT Goals Patient Stated Goal: to go to the bathroom PT Goal Formulation: With patient Time For Goal Achievement: 06/22/24 Potential to Achieve Goals: Fair Progress towards PT goals: Not progressing toward goals - comment (cognition)    Frequency    Min 2X/week       AM-PAC PT 6 Clicks Mobility   Outcome Measure  Help needed turning from your back to your side while in a flat bed without using bedrails?: A Lot Help needed moving from lying on your back to sitting on the side of a flat bed without using bedrails?: A Lot Help needed moving to and from a bed to a chair (including a wheelchair)?: Total Help needed standing up from a chair using your arms (e.g., wheelchair or bedside chair)?: Total Help needed to walk in hospital room?: Total Help needed climbing 3-5 steps with a railing? : Total 6 Click Score: 8    End of Session Equipment Utilized During Treatment: Gait belt;Oxygen  Activity Tolerance:  Patient tolerated treatment well Patient left: with call bell/phone within reach;with family/visitor present;in bed;with bed alarm set;with restraints reapplied Nurse Communication: Mobility status;Need for lift equipment (stedy) PT Visit Diagnosis: Unsteadiness on feet (R26.81);Other abnormalities of gait and mobility (R26.89);Muscle weakness (generalized) (M62.81)     Time: 1040-1104 PT Time Calculation (min) (ACUTE ONLY): 24 min  Charges:    $Therapeutic Exercise: 8-22 mins $Therapeutic Activity: 8-22 mins PT General Charges $$ ACUTE PT VISIT: 1 Visit                     Izetta Call, PT, DPT   Acute Rehabilitation Department Office [phone] Secure Chat Communication Preferred   Izetta JULIANNA Call 06/15/2024, 1:13 PM

## 2024-06-15 NOTE — Progress Notes (Signed)
 Nutrition Follow-up  DOCUMENTATION CODES:  Non-severe (moderate) malnutrition in context of chronic illness  INTERVENTION:  Continue tube feeding via Cortrak: Osmolite 1.2 at 45 ml/h (540 ml per day) x 12 hrs  Prosource TF20 60 ml 1x/d Provides 890 kcal, 54 gm protein, 412 ml free water daily  DYS 3 + thin liquids, advance per SLP  Magic cup TID with meals, each supplement provides 290 kcal and 9 grams of protein Start 48 hr Calorie Count to assess PO intake   NUTRITION DIAGNOSIS:  Moderate Malnutrition related to chronic illness (COPD) as evidenced by moderate fat depletion, moderate muscle depletion. - ongoing   GOAL:  Patient will meet greater than or equal to 90% of their needs - not met   MONITOR:  TF tolerance, Labs  REASON FOR ASSESSMENT:  Consult Enteral/tube feeding initiation and management  ASSESSMENT:  Pt with hx of COPD and HTN presented to ED with respiratory distress. Workup suggestive of COPD exacerbation, intubated in ED.    10/3 - Extubated  10/6 cortrak placed   Patient seen in room, spouse at bedside feeding him lunch. Pt cleared for PO diet by SLP on 10/10. Spouse reports patient does not like the taste of ensure. Cortrak remains in place, feeds switched to nocturnal over the weekend. Will order calorie count to assess ability to wean patient off tube feeds   7% weight loss noted in the last 3 months, not severe but concerning for age. Weight loss may be more significant as pt has pitting edema to the BLE. Weight Information (since admission)     Date/Time Weight Weight in lbs BSA (Calculated - sq m) BMI (Calculated) Who   06/08/24 0433 62.9 kg 138.67 lbs -- 19.35 VS   06/06/24 0458 67.7 kg 149.25 lbs -- 20.83 JW   06/05/24 0500 71.2 kg 156.97 lbs -- 21.9 JW   06/04/24 0331 70.2 kg 154.76 lbs -- 21.59 EM   06/03/24 0431 71 kg 156.53 lbs -- 21.84 EM   06/02/24 0412 74.2 kg 163.58 lbs -- 22.83 PH   06/01/24 2056 68 kg 150 lbs 1.85 sq meters 20.93 TD      Intake/Output Summary (Last 24 hours) at 06/15/2024 1322 Last data filed at 06/15/2024 0811 Gross per 24 hour  Intake 1800 ml  Output 1150 ml  Net 650 ml  Net IO Since Admission: 11,856.94 mL [06/15/24 1322]  Nutritionally Relevant Medications: Scheduled Meds reviewed.   Labs: CBG 121-191   NUTRITION - FOCUSED PHYSICAL EXAM: Flowsheet Row Most Recent Value  Orbital Region Mild depletion  Upper Arm Region Severe depletion  Thoracic and Lumbar Region Moderate depletion  Buccal Region Mild depletion  Temple Region Moderate depletion  Clavicle Bone Region Moderate depletion  Clavicle and Acromion Bone Region Severe depletion  Scapular Bone Region Severe depletion  Dorsal Hand Mild depletion  Patellar Region Moderate depletion  Anterior Thigh Region Moderate depletion  Posterior Calf Region Mild depletion  Edema (RD Assessment) Moderate  [BLE]  Hair Reviewed  Eyes Reviewed  Mouth Reviewed  Skin Reviewed  Nails Reviewed    Diet Order:   Diet Order             DIET DYS 3 Room service appropriate? Yes with Assist; Fluid consistency: Thin  Diet effective now                   EDUCATION NEEDS:  Not appropriate for education at this time  Skin:  Skin Assessment: Reviewed RN Assessment  Last  BM:  10/6 type 7  Height:  Ht Readings from Last 1 Encounters:  06/01/24 5' 11 (1.803 m)    Weight:  Wt Readings from Last 1 Encounters:  06/15/24 67.2 kg    Ideal Body Weight:  78.2 kg  BMI:  Body mass index is 20.66 kg/m.  Estimated Nutritional Needs:  Kcal:  1700-1900 kcal/d Protein:  80-100g/d Fluid:  >/=1.8L/d  Madalyn Potters, MS, RD, LDN Clinical Dietitian  Please see AMiON for contact information.

## 2024-06-15 NOTE — Discharge Instructions (Signed)
Information on my medicine - ELIQUIS (apixaban)  This medication education was reviewed with me or my healthcare representative as part of my discharge preparation.  The pharmacist that spoke with me during my hospital stay was:  Einar Grad, [facility]  Why was Eliquis prescribed for you? Eliquis was prescribed to treat blood clots that may have been found in the veins of your legs (deep vein thrombosis) or in your lungs (pulmonary embolism) and to reduce the risk of them occurring again.  What do You need to know about Eliquis ? The starting dose is ONE 5 mg tablet taken TWICE daily.  Eliquis may be taken with or without food.   Try to take the dose about the same time in the morning and in the evening. If you have difficulty swallowing the tablet whole please discuss with your pharmacist how to take the medication safely.  Take Eliquis exactly as prescribed and DO NOT stop taking Eliquis without talking to the doctor who prescribed the medication.  Stopping may increase your risk of developing a new blood clot.  Refill your prescription before you run out.  After discharge, you should have regular check-up appointments with your healthcare provider that is prescribing your Eliquis.    What do you do if you miss a dose? If a dose of ELIQUIS is not taken at the scheduled time, take it as soon as possible on the same day and twice-daily administration should be resumed. The dose should not be doubled to make up for a missed dose.  Important Safety Information A possible side effect of Eliquis is bleeding. You should call your healthcare provider right away if you experience any of the following: Bleeding from an injury or your nose that does not stop. Unusual colored urine (red or dark brown) or unusual colored stools (red or black). Unusual bruising for unknown reasons. A serious fall or if you hit your head (even if there is no bleeding).  Some medicines may interact with  Eliquis and might increase your risk of bleeding or clotting while on Eliquis. To help avoid this, consult your healthcare provider or pharmacist prior to using any new prescription or non-prescription medications, including herbals, vitamins, non-steroidal anti-inflammatory drugs (NSAIDs) and supplements.  This website has more information on Eliquis (apixaban): http://[email or web address].com/eliquis/home

## 2024-06-15 NOTE — Progress Notes (Signed)
 Speech Language Pathology Treatment: Dysphagia  Patient Details Name: Martin Ford MRN: [id number] DOB: 06-11-1936 Today's Date: 06/15/2024 Time: 8376-8368 SLP Time Calculation (min) (ACUTE ONLY): 8 min  Assessment / Plan / Recommendation Clinical Impression  Pt agreeable to limited trials of thin liquids and Dys 3 solids. He is attentive to solids, clearing his oral cavity completely. Cup sips of thin liquids were observed without signs clinically concerning for aspiration. Continue current diet. He will require full supervision for all PO intake to avoid straws and monitor for overt coughing since aspiration was sensed on the MBS. Sign posted at [facility] to reinforce liquids via cup only. Further skilled SLP f/u is not necessary if he is receiving recommended level of supervision, will sign off acutely.    HPI HPI: Pt is a 88 y.o. male who presented with worsening dyspnea despite increase in O2 from baseline 2L to 4L. He was taken to [facility] ED where he required intubation. CTA chest demonstrated Bronchiectasis, no PE. CT head demonstrated no acute issues, generalized atrophy and sinus thickening. ETT 9/29- 10/3 (5 days). Failed [facility] Swallow Screen 10/4. PMH: COPD Stage 3 by GOLD (PFT's Dec 2022 with FEV1 0.95 (38%), ratio 0.48) and chronically on 2L O2.      SLP Plan  All goals met          Recommendations  Diet recommendations: Dysphagia 3 (mechanical soft);Thin liquid Liquids provided via: Teaspoon;Cup Medication Administration: Whole meds with puree Supervision: Full supervision/cueing for compensatory strategies;Staff to assist with self feeding Compensations: Minimize environmental distractions;Slow rate;Small sips/bites                  Oral care BID   Frequent or constant Supervision/Assistance Dysphagia, pharyngoesophageal phase (R13.14)     All goals met     Damien Blumenthal, M.A., CCC-SLP Speech Language Pathology, Acute Rehabilitation Services  Secure Chat  preferred [phone]   06/15/2024, 4:47 PM

## 2024-06-15 NOTE — Progress Notes (Signed)
 [facility]  PROGRESS NOTE  Martin Ford FMW:[id number] DOB: 03-Feb-1936 DOA: 06/01/2024 PCP: Tobie Suzzane POUR, MD   Brief HPI:    Patient with PMH of COPD, GERD, chronic respiratory failure on 2 LPM, aortic stenosis presented to the hospital with complaints of confusion and shortness of breath. He sees Dr. Darlean and just saw him as an outpatient on 05/14/24 and was prescribed a prednisone  taper and 10 day course of Doxycycline .   On 9/29, he had worsening dyspnea despite increase in O2 from baseline 2L to 4L. He was taken to [facility] ED where he required intubation. He was given 1 dose of Vancomycin and Cefepime empirically.   CTA chest demonstrated Bronchiectasis, no PE. CT head demonstrated no acute issues, generalized atrophy and sinus thickening.   He was later transferred to [facility] for further management.   Assessment/Plan:   Acute on chronic hypoxic/hypercapnic respiratory failure 2/2 AECOPD - COPD Aspiration pneumonia. Required intubation on 9/29. Uses 2 LPM chronically at home. Currently on 2 LPM. Was on BiPAP after extubation. Patient appears to be having very poor cough. Difficulty use BiPAP in the setting of Cortrak. Completed IV Unasyn on 06/12/2024  Continue Solu-Medrol and nebulizer therapy. - Status post 1 dose of Lasix 20 mg IV with good diuretic response   AKI: Resolved  Elevated troponin - Troponin was elebated 105,111,101 on 10/8 after patient was fpund to be in hypertensive urgency, A fib with RVR - Has not had chest pain - Not a candidate for aggressive intervention - Follow-up as outpatient as per discussion with cardiologist, see below    Hypernatremia. From poor p.o. intake. Treated with free water and D10. Continue free water.   Mitral valve prolapse. Moderate aortic stenosis. Moderate aortic regurgitation. ICU discussed with cardiologist Dr  Ren Maha.  Recommended to follow as an outpatient This renders very poor prognosis for the patient's  current presentation though.   Hyperglycemia/hypoglycemia Hyperglycemia presumed 2/2 steroids.  Later on become hypoglycemic as steroids are weaned and tube feeds are stopped. Treated with D10.  Now on Cortrak.   -Core track feeding tube changed to nocturnal tube feeds only   Dysphagia. Poor p.o. intake. Secondary to patient's ongoing delirium patient is not a good candidate for swallow. SLP has been following but patient has not been able to pass swallow evaluation. -Started on dysphagia 3 diet -Have changed the tube feedings to nighttime only to increase p.o. intake during daytime   Moderate protein calorie malnutrition Dietitian consulting. Cortrak placed. Tube feeding changed to nocturnal only to increase p.o. intake during daytime   Acute bilateral distal DVT. CTPE negative for PE. Lower extremity Doppler on 10/3 positive for bilateral distal DVT No significant edema. Initially was on Eliquis.  Now on Lovenox. - Switch back to Eliquis today   Diarrhea: Likely associated with tube feeding.  May need fiber supplements. Currently no GI symptoms. No further workup.   Delirium. Patient is somewhat confused and agitated. Required Precedex in the ICU. Mental status has improved  Continue Seroquel.   Goals of care conversation. Discussed with wife at bedside. Patient has a living will that states that if he is not evaluated to state he would like to be resuscitated. But in a vegetative state he would not like to be kept alive artificially in machine. Palliative care consulted.  Patient's prognosis appears to be poor. Monitor.   Moderate protein calorie malnutrition Body mass index is 19.34 kg/m.  Continue supplement. Renders poor prognosis for the patient.  Hypertension. Blood pressure elevated. As needed hydralazine.     Medications     apixaban  5 mg Per Tube BID   budesonide (PULMICORT) nebulizer solution  0.25 mg Nebulization BID   Chlorhexidine  Gluconate Cloth  6 each Topical Daily   famotidine   20 mg Per Tube Daily   feeding supplement (OSMOLITE 1.2 CAL)  1,000 mL Per Tube Q24H   [START ON 06/16/2024] feeding supplement (PROSource TF20)  60 mL Per Tube Q2200   free water  150 mL Per Tube QHS   guaiFENesin  400 mg Per Tube Q8H   methylPREDNISolone (SOLU-MEDROL) injection  40 mg Intravenous Q24H   QUEtiapine  50 mg Oral QPM     Data Reviewed:   CBG:  Recent Labs  Lab 06/15/24 0007 06/15/24 0332 06/15/24 0743 06/15/24 1147 06/15/24 1649  GLUCAP 137* 121* 142* 123* 145*    SpO2: 100 % O2 Flow Rate (L/min): 2 L/min FiO2 (%): 32 %    Vitals:   06/15/24 0740 06/15/24 0856 06/15/24 1142 06/15/24 1644  BP: 113/71  136/60 (!) 166/70  Pulse: 61 61    Resp: 17 (!) 26 19 19   Temp: 97.6 F (36.4 C)  (!) 97.5 F (36.4 C) (!) 97 F (36.1 C)  TempSrc: Oral  Axillary Axillary  SpO2: 100% 100% 100% 100%  Weight:      Height:          Data Reviewed:  Basic Metabolic Panel: Recent Labs  Lab 06/08/24 2338 06/09/24 0831 06/09/24 1124 06/10/24 0446 06/11/24 0418 06/12/24 0425  NA 147* 148* 145 143  --  141  K 3.8 3.7 3.5 3.4*  --  3.7  CL 104 106 104 104  --  101  CO2 32 30 28 30   --  30  GLUCOSE 170* 197* 156* 213*  --  157*  BUN 43* 42* 44* 40*  --  39*  CREATININE 0.89 0.77 0.79 0.83  --  0.84  CALCIUM  8.6* 8.6* 8.6* 8.5*  --  8.4*  MG  --  2.1  --  2.1 2.2 2.2  PHOS  --  2.3*  --  2.7 3.3  --     CBC: Recent Labs  Lab 06/09/24 0831 06/10/24 0446 06/11/24 0418 06/12/24 0425  WBC 9.4 10.4 14.9* 13.8*  HGB 12.0* 12.2* 13.4 12.7*  HCT 36.9* 38.1* 41.8 38.5*  MCV 97.6 97.2 97.7 95.8  PLT 282 320 367 328    LFT Recent Labs  Lab 06/10/24 0446  AST 19  ALT 28  ALKPHOS 55  BILITOT 1.2  PROT 5.9*  ALBUMIN 2.8*     Antibiotics: Anti-infectives (From admission, onward)    Start     Dose/Rate Route Frequency Ordered Stop   06/08/24 1615  Ampicillin-Sulbactam (UNASYN) 3 g in sodium chloride  0.9 % 100 mL IVPB        3 g 200 mL/hr over 30 Minutes Intravenous Every 8 hours 06/08/24 1521 06/12/24 1632   06/02/24 1230  azithromycin  (ZITHROMAX ) tablet 500 mg        500 mg Per Tube Daily 06/02/24 1134 06/04/24 0842   06/02/24 1000  cefTRIAXone (ROCEPHIN) 1 g in sodium chloride 0.9 % 100 mL IVPB  Status:  Discontinued        1 g 200 mL/hr over 30 Minutes Intravenous Every 24 hours 06/02/24 0139 06/02/24 1134   06/01/24 2130  vancomycin (VANCOCIN) IVPB 1000 mg/200 mL premix        1,000 mg  200 mL/hr over 60 Minutes Intravenous  Once 06/01/24 2116 06/01/24 2307   06/01/24 2130  ceFEPIme (MAXIPIME) 2 g in sodium chloride 0.9 % 100 mL IVPB        2 g 200 mL/hr over 30 Minutes Intravenous  Once 06/01/24 2116 06/01/24 2210        DVT prophylaxis: Lovenox  Code Status: Full code  Family Communication: Discussed with patient's wife at bedside   CONSULTS palliative care   Subjective   Denies any complaints.  Objective    Physical Examination:  General-appears in no acute distress Heart-S1-S2, regular, no murmur auscultated Lungs-clear to auscultation bilaterally, no wheezing or crackles auscultated Abdomen-soft, nontender, no organomegaly Extremities-no edema in the lower extremities Neuro-alert, oriented x3, no focal deficit noted Status is: Inpatient:         Sabas GORMAN Brod   [facility] If 7PM-7AM, please contact night-coverage at [email or web address], Office  [phone]   06/15/2024, 6:19 PM  LOS: 14 days

## 2024-06-16 DIAGNOSIS — E440 Moderate protein-calorie malnutrition: Secondary | ICD-10-CM | POA: Diagnosis not present

## 2024-06-16 DIAGNOSIS — J449 Chronic obstructive pulmonary disease, unspecified: Secondary | ICD-10-CM | POA: Diagnosis not present

## 2024-06-16 DIAGNOSIS — J9621 Acute and chronic respiratory failure with hypoxia: Secondary | ICD-10-CM | POA: Diagnosis not present

## 2024-06-16 LAB — GLUCOSE, CAPILLARY
Glucose-Capillary: 103 mg/dL — ABNORMAL HIGH (ref 70–99)
Glucose-Capillary: 109 mg/dL — ABNORMAL HIGH (ref 70–99)
Glucose-Capillary: 128 mg/dL — ABNORMAL HIGH (ref 70–99)
Glucose-Capillary: 135 mg/dL — ABNORMAL HIGH (ref 70–99)
Glucose-Capillary: 155 mg/dL — ABNORMAL HIGH (ref 70–99)
Glucose-Capillary: 157 mg/dL — ABNORMAL HIGH (ref 70–99)
Glucose-Capillary: 83 mg/dL (ref 70–99)

## 2024-06-16 MED ORDER — PREDNISONE 20 MG PO TABS
30.0000 mg | ORAL_TABLET | Freq: Every day | ORAL | Status: AC
  Administered 2024-06-18: 30 mg via ORAL
  Filled 2024-06-16: qty 1

## 2024-06-16 MED ORDER — PREDNISONE 20 MG PO TABS
40.0000 mg | ORAL_TABLET | Freq: Every day | ORAL | Status: AC
  Administered 2024-06-17: 40 mg via ORAL
  Filled 2024-06-16: qty 2

## 2024-06-16 MED ORDER — PREDNISONE 20 MG PO TABS: 40.0000 mg | ORAL_TABLET | Freq: Every day | ORAL | Status: DC

## 2024-06-16 MED ORDER — PREDNISONE 10 MG PO TABS
10.0000 mg | ORAL_TABLET | Freq: Every day | ORAL | Status: AC
  Administered 2024-06-20: 10 mg via ORAL
  Filled 2024-06-16: qty 1

## 2024-06-16 MED ORDER — PREDNISONE 20 MG PO TABS
20.0000 mg | ORAL_TABLET | Freq: Every day | ORAL | Status: AC
  Administered 2024-06-19: 20 mg via ORAL
  Filled 2024-06-16: qty 1

## 2024-06-16 NOTE — Progress Notes (Signed)
 Calorie Count Note  48 hour calorie count ordered. Only two tickets from breakfast collected yesterday. Secure chat sent to RN about ongoing calorie count and to collect tray tickets and document % eaten and to place in envelope.   Diet: DYS 3 + thin liquids  Supplements: Magic Cup TID    Day 1 Breakfast: 25% pancakes (78 kcals, 0 gram protien)  Lunch: no ticket  Dinner: no ticket  Supplements: no ticket    Nutrition Dx: Moderate Malnutrition related to chronic illness (COPD) as evidenced by moderate fat depletion, moderate muscle depletion   Goal: Patient will meet greater than or equal to 90% of their needs   Intervention:  Continue tube feeding via Cortrak: Osmolite 1.2 at 45 ml/h (540 ml per day) x 12 hrs  Prosource TF20 60 ml 1x/d Provides 890 kcal, 54 gm protein, 412 ml free water daily  DYS 3 + thin liquids, advance per SLP  Magic cup TID with meals, each supplement provides 290 kcal and 9 grams of protein Continue 48 hr Calorie Count to assess PO intake   Madalyn Potters, MS, RD, LDN Clinical Dietitian  Contact via secure chat. If unavailable, use group chat RD Inpatient.

## 2024-06-16 NOTE — Progress Notes (Signed)
 [facility]  PROGRESS NOTE  Tiron Suski FMW:[id number] DOB: Sep 05, 1935 DOA: 06/01/2024 PCP: Tobie Suzzane POUR, MD   Brief HPI:    Patient with PMH of COPD, GERD, chronic respiratory failure on 2 LPM, aortic stenosis presented to the hospital with complaints of confusion and shortness of breath. He sees Dr. Darlean and just saw him as an outpatient on 05/14/24 and was prescribed a prednisone  taper and 10 day course of Doxycycline .   On 9/29, he had worsening dyspnea despite increase in O2 from baseline 2L to 4L. He was taken to [facility] ED where he required intubation. He was given 1 dose of Vancomycin and Cefepime empirically.   CTA chest demonstrated Bronchiectasis, no PE. CT head demonstrated no acute issues, generalized atrophy and sinus thickening.   He was later transferred to [facility] for further management.   Assessment/Plan:   Acute on chronic hypoxic/hypercapnic respiratory failure 2/2 AECOPD - COPD Aspiration pneumonia. Required intubation on 9/29. Uses 2 LPM chronically at home. Currently on 2 LPM. Was on BiPAP after extubation. Completed IV Unasyn on 06/12/2024  He was started on  Solu-Medrol and nebulizer therapy. - Status post 1 dose of Lasix 20 mg IV with good diuretic response -Will discontinue Solu-Medrol and start prednisone  taper from 10/15   AKI: Resolved  Elevated troponin - Troponin was elebated 105,111,101 on 10/8 after patient was fpund to be in hypertensive urgency, A fib with RVR - Has not had chest pain - Not a candidate for aggressive intervention - Follow-up as outpatient as per discussion with cardiologist, see below    Hypernatremia. From poor p.o. intake. Treated with free water and D10. Continue free water.   Mitral valve prolapse. Moderate aortic stenosis. Moderate aortic regurgitation. ICU discussed with cardiologist Dr  Ren Maha.  Recommended to follow as an outpatient This renders very poor prognosis for the patient's current  presentation though.   Hyperglycemia/hypoglycemia Hyperglycemia presumed 2/2 steroids.  Later on become hypoglycemic as steroids are weaned and tube feeds are stopped. Treated with D10.  Now on Cortrak.   -Core track feeding tube changed to nocturnal tube feeds only   Dysphagia. Poor p.o. intake. Secondary to patient's ongoing delirium patient is not a good candidate for swallow. SLP has been following but patient has not been able to pass swallow evaluation. -Started on dysphagia 3 diet -Have changed the tube feedings to nighttime only to increase p.o. intake during daytime   Moderate protein calorie malnutrition Dietitian consulting. Cortrak placed. Tube feeding changed to nocturnal only to increase p.o. intake during daytime   Acute bilateral distal DVT. CTPE negative for PE. Lower extremity Doppler on 10/3 positive for bilateral distal DVT No significant edema. Initially was on Eliquis.  Now on Lovenox. Switched back to Eliquis    Diarrhea: Likely associated with tube feeding.  May need fiber supplements. Currently no GI symptoms. No further workup.   Delirium. Patient is somewhat confused and agitated. Required Precedex in the ICU. Mental status has improved  Continue Seroquel.   Goals of care conversation. Discussed with wife at bedside. Patient has a living will that states that if he is not evaluated to state he would like to be resuscitated. But in a vegetative state he would not like to be kept alive artificially in machine. Palliative care consulted.  Patient's prognosis appears to be poor. Monitor.   Moderate protein calorie malnutrition Body mass index is 19.34 kg/m.  Continue supplement.    Hypertension. Blood pressure stable As needed hydralazine.  Medications     apixaban  5 mg Per Tube BID   budesonide (PULMICORT) nebulizer solution  0.25 mg Nebulization BID   Chlorhexidine Gluconate Cloth  6 each Topical Daily   famotidine   20 mg Per  Tube Daily   feeding supplement (OSMOLITE 1.2 CAL)  1,000 mL Per Tube Q24H   feeding supplement (PROSource TF20)  60 mL Per Tube Q2200   free water  150 mL Per Tube QHS   guaiFENesin  400 mg Per Tube Q8H   methylPREDNISolone (SOLU-MEDROL) injection  40 mg Intravenous Q24H   QUEtiapine  50 mg Oral QPM     Data Reviewed:   CBG:  Recent Labs  Lab 06/15/24 1649 06/15/24 2035 06/16/24 0048 06/16/24 0426 06/16/24 0737  GLUCAP 145* 123* 128* 135* 83    SpO2: 97 % O2 Flow Rate (L/min): 2 L/min FiO2 (%): 32 %    Vitals:   06/15/24 2013 06/15/24 2318 06/16/24 0427 06/16/24 0735  BP: (!) 142/93 (!) 105/57 (!) 153/64 (!) 118/42  Pulse: 80 (!) 53 (!) 52   Resp: 20 12 12 16   Temp: 97.7 F (36.5 C) 98.6 F (37 C) 97.7 F (36.5 C) 97.9 F (36.6 C)  TempSrc: Axillary Oral Oral Oral  SpO2: 96% 97% 100% 97%  Weight:   65.9 kg   Height:          Data Reviewed:  Basic Metabolic Panel: Recent Labs  Lab 06/09/24 0831 06/09/24 1124 06/10/24 0446 06/11/24 0418 06/12/24 0425  NA 148* 145 143  --  141  K 3.7 3.5 3.4*  --  3.7  CL 106 104 104  --  101  CO2 30 28 30   --  30  GLUCOSE 197* 156* 213*  --  157*  BUN 42* 44* 40*  --  39*  CREATININE 0.77 0.79 0.83  --  0.84  CALCIUM  8.6* 8.6* 8.5*  --  8.4*  MG 2.1  --  2.1 2.2 2.2  PHOS 2.3*  --  2.7 3.3  --     CBC: Recent Labs  Lab 06/09/24 0831 06/10/24 0446 06/11/24 0418 06/12/24 0425  WBC 9.4 10.4 14.9* 13.8*  HGB 12.0* 12.2* 13.4 12.7*  HCT 36.9* 38.1* 41.8 38.5*  MCV 97.6 97.2 97.7 95.8  PLT 282 320 367 328    LFT Recent Labs  Lab 06/10/24 0446  AST 19  ALT 28  ALKPHOS 55  BILITOT 1.2  PROT 5.9*  ALBUMIN 2.8*     Antibiotics: Anti-infectives (From admission, onward)    Start     Dose/Rate Route Frequency Ordered Stop   06/08/24 1615  Ampicillin-Sulbactam (UNASYN) 3 g in sodium chloride 0.9 % 100 mL IVPB        3 g 200 mL/hr over 30 Minutes Intravenous Every 8 hours 06/08/24 1521 06/12/24 1632    06/02/24 1230  azithromycin  (ZITHROMAX ) tablet 500 mg        500 mg Per Tube Daily 06/02/24 1134 06/04/24 0842   06/02/24 1000  cefTRIAXone (ROCEPHIN) 1 g in sodium chloride 0.9 % 100 mL IVPB  Status:  Discontinued        1 g 200 mL/hr over 30 Minutes Intravenous Every 24 hours 06/02/24 0139 06/02/24 1134   06/01/24 2130  vancomycin (VANCOCIN) IVPB 1000 mg/200 mL premix        1,000 mg 200 mL/hr over 60 Minutes Intravenous  Once 06/01/24 2116 06/01/24 2307   06/01/24 2130  ceFEPIme (MAXIPIME) 2 g in sodium chloride  0.9 % 100 mL IVPB        2 g 200 mL/hr over 30 Minutes Intravenous  Once 06/01/24 2116 06/01/24 2210        DVT prophylaxis: Lovenox  Code Status: Full code  Family Communication: Discussed with patient's wife at bedside   CONSULTS palliative care   Subjective   Patient seen and examined.  Denies any complaints.  Objective    Physical Examination:  Appears in no acute distress S1-S2, regular, no murmur auscultated Lungs clear to auscultation bilaterally Abdomen is soft, nontender Status is: Inpatient:         Sabas GORMAN Brod   [facility] If 7PM-7AM, please contact night-coverage at [email or web address], Office  [phone]   06/16/2024, 7:55 AM  LOS: 15 days

## 2024-06-16 NOTE — Progress Notes (Signed)
 OT Cancellation Note  Patient Details Name: Martin Ford MRN: [id number] DOB: June 20, 1936   Cancelled Treatment:    Reason Eval/Treat Not Completed: Patient at procedure or test/ unavailable.  Undergoing breathing tx.    Juel Bellerose D Sharmayne Jablon 06/16/2024, 2:37 PM 06/16/2024  RP, OTR/L  Acute Rehabilitation Services  Office:  [phone]

## 2024-06-17 DIAGNOSIS — J439 Emphysema, unspecified: Secondary | ICD-10-CM

## 2024-06-17 LAB — GLUCOSE, CAPILLARY
Glucose-Capillary: 102 mg/dL — ABNORMAL HIGH (ref 70–99)
Glucose-Capillary: 91 mg/dL (ref 70–99)
Glucose-Capillary: 116 mg/dL — ABNORMAL HIGH (ref 70–99)
Glucose-Capillary: 160 mg/dL — ABNORMAL HIGH (ref 70–99)
Glucose-Capillary: 143 mg/dL — ABNORMAL HIGH (ref 70–99)
Glucose-Capillary: 124 mg/dL — ABNORMAL HIGH (ref 70–99)

## 2024-06-17 MED ORDER — LORAZEPAM 2 MG/ML IJ SOLN
1.0000 mg | INTRAMUSCULAR | Status: AC
Start: 2024-06-17 — End: 2024-06-17
  Administered 2024-06-17: 1 mg via INTRAVENOUS
  Filled 2024-06-17: qty 1

## 2024-06-17 NOTE — Progress Notes (Signed)
 Calorie Count Note  48 hour calorie count ordered.   Diet: DYS 3 + thin liquids  Supplements: Magic Cup TID   Estimated Nutritional Needs:  Kcal:  1700-1900 kcal/d Protein:  80-100g/d Fluid:  >/=1.8L/d  Day 1 Breakfast: 0% Lunch: 25% mac and cheese ( 49 kcal 2 g) Dinner: 5% meatloaf (10 kcal 1g), 40% mashed potatoes (50 kcal 1g), 5% whole green beans (1 kcal 0g), 100% ice cream (130 kcal 2 g), 5% lemonade (3 kcal 0 g) Supplements: 100% Magic cup x1 (290 kcals  9g)  Total intake: 533 kcal (31% of minimum estimated needs)  15 protein (19% of minimum estimated needs)  Nutrition Dx: Moderate Malnutrition related to chronic illness (COPD) as evidenced by moderate fat depletion, moderate muscle depletion   Goal: Patient will meet greater than or equal to 90% of their needs   Intervention:  Continue tube feeding via Cortrak: Osmolite 1.2 at 45 ml/h (540 ml per day) x 12 hrs  Prosource TF20 60 ml 1x/d Provides 890 kcal, 54 gm protein, 412 ml free water daily  DYS 3 + thin liquids, advance per SLP  Magic cup TID with meals, each supplement provides 290 kcal and 9 grams of protein Continue 48 hr Calorie Count to assess PO intake   Madalyn Potters, MS, RD, LDN Clinical Dietitian  Contact via secure chat. If unavailable, use group chat RD Inpatient.

## 2024-06-17 NOTE — TOC Progression Note (Signed)
 Transition of Care [facility]) - Progression Note    Patient Details  Name: Martin Ford MRN: [id number] Date of Birth: 1936-04-26  Transition of Care [facility]) CM/SW Contact  Graves-Bigelow, Erminio Deems, RN Phone Number: 06/17/2024, 1:00 PM  Clinical Narrative: Patient was discussed in progression rounds. MD wanted patient to be followed by palliative Care. Inpatient Case Manager reached out to the Palliative Care Office and the patient will be seen for continued GOC. Inpatient Case Manager will continue to follow for disposition needs.   Expected Discharge Plan: Skilled Nursing Facility Barriers to Discharge: Continued Medical Work up  Expected Discharge Plan and Services In-house Referral: Clinical Social Work     Living arrangements for the past 2 months: Single Family Home  Social Drivers of Health (SDOH) Interventions SDOH Screenings   Food Insecurity: No Food Insecurity (06/01/2022)  Housing: Low Risk  (06/06/2024)  Transportation Needs: No Transportation Needs (06/06/2024)  Utilities: Patient Unable To Answer (06/03/2024)  Alcohol Screen: Low Risk  (06/01/2022)  Depression (PHQ2-9): Low Risk  (02/21/2024)  Financial Resource Strain: Low Risk  (06/01/2022)  Physical Activity: Inactive (06/01/2022)  Social Connections: Unknown (06/01/2022)  Stress: No Stress Concern Present (06/01/2022)  Tobacco Use: Medium Risk (06/02/2024)   Readmission Risk Interventions     No data to display

## 2024-06-17 NOTE — Progress Notes (Signed)
 Progress Note   Patient: Martin Ford FMW:[id number] DOB: [birth year] DOA: 06/01/2024     16 DOS: the patient was seen and examined on 06/17/2024   Brief hospital course:  Patient with PMH of COPD, GERD, chronic respiratory failure on 2 LPM, aortic stenosis presented to the hospital with complaints of confusion and shortness of breath. He sees Dr. Darlean and just saw him as an outpatient on 05/14/24 and was prescribed a prednisone  taper and 10 day course of Doxycycline .   On 9/29, he had worsening dyspnea despite increase in O2 from baseline 2L to 4L. He was taken to [facility] ED where he required intubation. He was given 1 dose of Vancomycin and Cefepime empirically.   CTA chest demonstrated Bronchiectasis, no PE. CT head demonstrated no acute issues, generalized atrophy and sinus thickening.   He was later transferred to [facility] for further management.  Assessment and Plan:  Acute on chronic hypoxic/hypercapnic respiratory failure 2/2 AECOPD - COPD Aspiration pneumonia. Required intubation on 9/29. Uses 2 LPM chronically at home. Currently on 2 LPM. Was on BiPAP after extubation. Patient appears to be having very poor cough. Chest PT was ordered.  Currently discontinued by respiratory therapist. Difficulty use BiPAP in the setting of Cortrak. S/P IV Unasyn. S/P Solu-Medrol, continue on Prednisone   and nebulizer therapy.   AKI: Creatinine went to 2.24. Post diuresis. Renal function improving.  normal values.  Hypernatremia. From poor p.o. intake. Treated with free water and D10. Continue free water.  Mitral valve prolapse. Moderate aortic stenosis. Moderate aortic regurgitation. ICU discussed with cardiologist Dr  Ren Maha.  Recommended to follow as an outpatient This renders very poor prognosis for the patient's current presentation though.   Hyperglycemia/hypoglycemia Hyperglycemia presumed 2/2 steroids.  Later on become hypoglycemic as steroids are weaned and tube feeds are  stopped. Treated with D10.  Now on Cortrak.    Dysphagia. Poor p.o. intake. Secondary to patient's ongoing delirium patient is not a good candidate for swallow. SLP has been following but patient has not been able to pass swallow evaluation. Suspect prognosis is poor. There is also concern for aspiration pneumonia for which the patient is on antibiotics.   Moderate protein calorie malnutrition Dietitian consulting. Cortrak placed. Initiating tube feed.   Acute bilateral distal DVT. CTPE negative for PE. Lower extremity Doppler on 10/3 positive for bilateral distal DVT No significant edema. Initially was on Eliquis.  Now on Lovenox.   Diarrhea: Likely associated with tube feeding.  May need fiber supplements. Currently no GI symptoms. No further workup.  Delirium. Patient is somewhat confused and agitated. Required Precedex in the ICU. Unchanged delirium compared to evaluation on 10/5. Will monitor closely.  Continue Seroquel.  Goals of care conversation. Discussed with wife at bedside. Patient has a living will that states that if he is not evaluated to state he would like to be resuscitated. But in a vegetative state he would not like to be kept alive artificially in machine. Palliative care consulted.  Patient's prognosis appears to be poor. Monitor.  Moderate protein calorie malnutrition Body mass index is 19.34 kg/m.  Continue supplement. Poor prognosis for the patient.  Hypertension. Blood pressure elevated. As needed hydralazine.      Subjective: Feels better  Physical Exam: Vitals:   06/17/24 0500 06/17/24 0743 06/17/24 0829 06/17/24 1304  BP:   (!) 146/71 137/77  Pulse:   (!) 55 78  Resp:   13 (!) 27  Temp:   97.8 F (36.6 C) (!) 97.5 F (  36.4 C)  TempSrc:   Oral Oral  SpO2:  97% 100% 100%  Weight: 67 kg     Height:      Constitutional: Alert, awake, confused HEENT: Neck supple, Oxygen  and feeding tube present Respiratory: B/L decreased air  entry and transmitted sounds Cardiovascular: Regular rate and rhythm, no murmurs / rubs / gallops. No extremity edema. 2+ pedal pulses. No carotid bruits.  Abdomen: no tenderness, no masses palpated. No hepatosplenomegaly. Bowel sounds positive.  Musculoskeletal: no clubbing / cyanosis. No joint deformity upper and lower extremities. Good ROM, no contractures. Normal muscle tone.  Skin: no rashes, lesions, ulcers. No induration Neurologic: CN 2-12 grossly intact. Sensation intact, DTR normal. Strength 5/5 x all 4 extremities.  Psychiatric: Normal judgment and insight. Alert and oriented x 3. Normal mood.    Data Reviewed:  Reviewed  Family Communication: None  Disposition: Status is: Inpatient Remains inpatient appropriate because: Ongoing clinical recovery  Planned Discharge Destination: Skilled nursing facility    Time spent: 35 minutes  Author: Nena Rebel, MD 06/17/2024 2:52 PM  For on call review [email or web address].

## 2024-06-17 NOTE — Progress Notes (Signed)
 Occupational Therapy Treatment Patient Details Name: Martin Ford MRN: [id number] DOB: Jan 08, 1936 Today's Date: 06/17/2024   History of present illness The pt is an 88 yo male presenting to [facility] 9/29 for SOB and progressive AMS. Pt intubated 9/29 and transferred to [facility] 9/30, extubated 10/3. DVT found 10/3. PMH includes: COPD stage 3 chronically on 2L O2, HTN, HLD, hypothyroidism, PVD, and essential tremor.   OT comments  Pt in session was able to complete bed mobility with mod assist to EOB. He then engaged in oral care with  set up followed by UE bathing with set up and application of deodorant. He then required min assist with dressing for UE. Pt was unsure about standing but attempted with total assist and then wanted to lay back down in a sidelining position. Patient will benefit from continued inpatient follow up therapy, <3 hours/day.       If plan is discharge home, recommend the following:  Two people to help with walking and/or transfers;A lot of help with bathing/dressing/bathroom   Equipment Recommendations  None recommended by OT    Recommendations for Other Services      Precautions / Restrictions Precautions Precautions: Fall Recall of Precautions/Restrictions: Impaired Precaution/Restrictions Comments: alarm belt in bed, foley, rectal tube Restrictions Weight Bearing Restrictions Per Provider Order: No       Mobility Bed Mobility Overal bed mobility: Needs Assistance Bed Mobility: Supine to Sit, Sit to Supine   Sidelying to sit: Min assist Supine to sit: Mod assist Sit to supine: Mod assist        Transfers Overall transfer level: Needs assistance   Transfers: Sit to/from Stand Sit to Stand: Total assist           General transfer comment: attempted but think they were tired from session while sitting at EOB     Balance Overall balance assessment: Needs assistance Sitting-balance support: Feet supported Sitting balance-Leahy Scale:  Good Sitting balance - Comments: can static sit EOB without support and engaged in reaching for ADL items                                   ADL either performed or assessed with clinical judgement   ADL Overall ADL's : Needs assistance/impaired Eating/Feeding: Sitting;Set up   Grooming: Wash/dry face;Set up;Sitting   Upper Body Bathing: Minimal assistance;Sitting   Lower Body Bathing: Total assistance;Bed level   Upper Body Dressing : Minimal assistance;Sitting   Lower Body Dressing: Total assistance;Bed level                      Extremity/Trunk Assessment Upper Extremity Assessment Upper Extremity Assessment: Generalized weakness   Lower Extremity Assessment Lower Extremity Assessment: Defer to PT evaluation        Vision   Vision Assessment?: Wears glasses for reading;Wears glasses for driving Additional Comments: pt able to read board on wall   Perception Perception Perception: Within Functional Limits   Praxis Praxis Praxis: [facility]   Communication Communication Communication: Impaired Factors Affecting Communication: Hearing impaired;Reduced clarity of speech   Cognition Arousal: Alert Behavior During Therapy: WFL for tasks assessed/performed Cognition: Cognition impaired   Orientation impairments: Place, Time, Situation Awareness: Intellectual awareness impaired, Online awareness impaired Memory impairment (select all impairments): Short-term memory Attention impairment (select first level of impairment): Sustained attention Executive functioning impairment (select all impairments): Initiation, Reasoning, Problem solving OT - Cognition Comments: pt needs increase in  time                 Following commands: Impaired Following commands impaired: Follows one step commands inconsistently, Follows one step commands with increased time      Cueing   Cueing Techniques: Verbal cues, Gestural cues, Tactile cues  Exercises       Shoulder Instructions       General Comments pt on RA as pulled off o2 but was at 99% and left on RA    Pertinent Vitals/ Pain       Pain Assessment Pain Assessment: Faces Faces Pain Scale: Hurts a little bit Breathing: normal Negative Vocalization: none Facial Expression: smiling or inexpressive Body Language: relaxed Consolability: no need to console PAINAD Score: 0 Facial Expression: Relaxed, neutral Body Movements: Absence of movements Muscle Tension: Relaxed Compliance with ventilator (intubated pts.): N/A Vocalization (extubated pts.): N/A CPOT Total: 0 Pain Location: pt reportted bottom Pain Descriptors / Indicators: Discomfort Pain Intervention(s): Limited activity within patient's tolerance, Monitored during session, Repositioned  Home Living                                          Prior Functioning/Environment              Frequency  Min 2X/week        Progress Toward Goals  OT Goals(current goals can now be found in the care plan section)  Progress towards OT goals: Progressing toward goals  Acute Rehab OT Goals OT Goal Formulation: Patient unable to participate in goal setting Time For Goal Achievement: 07/01/24 Potential to Achieve Goals: Fair ADL Goals Pt Will Perform Grooming: with supervision;sitting Pt Will Perform Upper Body Dressing: with supervision;sitting Pt Will Perform Lower Body Dressing: with min assist;sit to/from stand Pt Will Transfer to Toilet: with min assist;bedside commode;stand pivot transfer Pt/caregiver will Perform Home Exercise Program: Increased strength;Both right and left upper extremity;With theraband;With Supervision  Plan      Co-evaluation                 AM-PAC OT 6 Clicks Daily Activity     Outcome Measure   Help from another person eating meals?: A Little Help from another person taking care of personal grooming?: A Little Help from another person toileting, which includes  using toliet, bedpan, or urinal?: Total Help from another person bathing (including washing, rinsing, drying)?: A Lot Help from another person to put on and taking off regular upper body clothing?: A Little Help from another person to put on and taking off regular lower body clothing?: A Lot 6 Click Score: 14    End of Session Equipment Utilized During Treatment: Gait belt  OT Visit Diagnosis: Unsteadiness on feet (R26.81);Muscle weakness (generalized) (M62.81);Pain Pain - part of body:  (bottom)   Activity Tolerance Patient tolerated treatment well   Patient Left in bed;with call bell/phone within reach;with bed alarm set (belt alarm on)   Nurse Communication Mobility status        Time: 1055-1130 OT Time Calculation (min): 35 min  Charges: OT General Charges $OT Visit: 1 Visit OT Treatments $Self Care/Home Management : 23-37 mins  Warrick POUR OTR/L  Acute Rehab Services  [phone] office number   Warrick Berber 06/17/2024, 11:41 AM

## 2024-06-18 DIAGNOSIS — J439 Emphysema, unspecified: Secondary | ICD-10-CM | POA: Diagnosis not present

## 2024-06-18 LAB — COMPREHENSIVE METABOLIC PANEL WITH GFR
ALT: 67 U/L — ABNORMAL HIGH (ref 0–44)
AST: 28 U/L (ref 15–41)
Albumin: 2.8 g/dL — ABNORMAL LOW (ref 3.5–5.0)
Alkaline Phosphatase: 69 U/L (ref 38–126)
Anion gap: 11 (ref 5–15)
BUN: 26 mg/dL — ABNORMAL HIGH (ref 8–23)
CO2: 26 mmol/L (ref 22–32)
Calcium: 8 mg/dL — ABNORMAL LOW (ref 8.9–10.3)
Chloride: 95 mmol/L — ABNORMAL LOW (ref 98–111)
Creatinine, Ser: 0.87 mg/dL (ref 0.61–1.24)
GFR, Estimated: >60 mL/min (ref >60)
Glucose, Bld: 144 mg/dL — ABNORMAL HIGH (ref 70–99)
Potassium: 5 mmol/L (ref 3.5–5.1)
Sodium: 132 mmol/L — ABNORMAL LOW (ref 135–145)
Total Bilirubin: 1.6 mg/dL — ABNORMAL HIGH (ref 0.0–1.2)
Total Protein: 5.4 g/dL — ABNORMAL LOW (ref 6.5–8.1)

## 2024-06-18 LAB — GLUCOSE, CAPILLARY
Glucose-Capillary: 109 mg/dL — ABNORMAL HIGH (ref 70–99)
Glucose-Capillary: 98 mg/dL (ref 70–99)
Glucose-Capillary: 144 mg/dL — ABNORMAL HIGH (ref 70–99)
Glucose-Capillary: 158 mg/dL — ABNORMAL HIGH (ref 70–99)
Glucose-Capillary: 145 mg/dL — ABNORMAL HIGH (ref 70–99)

## 2024-06-18 NOTE — Progress Notes (Signed)
 Physical Therapy Treatment Patient Details Name: Martin Ford MRN: [id number] DOB: [birth year] Today's Date: 06/18/2024   History of Present Illness The pt is an 88 yo male presenting to [facility] 9/29 for SOB and progressive AMS. Pt intubated 9/29 and transferred to [facility] 9/30, extubated 10/3. DVT found 10/3. PMH includes: COPD stage 3 chronically on 2L O2, HTN, HLD, hypothyroidism, PVD, and essential tremor.    PT Comments  Pt received in bed after finishing breakfast, pt agreeable to therapy session, VSS on RA. Pt needing up to maxA +2 for sit>stand from bed surface, and able to maintain standing x2 trials for 15-20 seconds each, but quick to fatigue with attempts at standing weight shifting in [place] platform. Pt positioned upright in chair and given sip of water at end of session from cup without straw, but pt with coughing episode after swallowing and then hypoxic with HR to 125 bpm, SpO2 desat to 72%, did not improve with cues for pursed-lip breathing and repositioning, so pt placed on 2L O2 [place] and RN/MD/SLP notified. Patient will benefit from continued inpatient follow up therapy, <3 hours/day.    Vital Signs  Patient Position (if appropriate) Orthostatic Vitals  Orthostatic Sitting  BP- Sitting 121/52 (73) (EOB, feet down)   Orthostatic Sitting  BP- Sitting 111/60 (66) (recliner initially)  Pulse- Sitting 90   Orthostatic Sitting  BP- Sitting 131/78 (88) (recliner after ~9 minutes)  Pulse- Sitting 85  Oxygen  Therapy  SpO2 100 %  O2 Device Nasal Cannula  O2 Flow Rate (L/min) 2 L/min (PTA replaced 2L O2 [place] as pt desat to 72% after coughing with sips of water, RN notified.)     If plan is discharge home, recommend the following: Two people to help with walking and/or transfers;Two people to help with bathing/dressing/bathroom;Assistance with cooking/housework;Assistance with feeding;Direct supervision/assist for medications management;Direct supervision/assist for financial  management;Assist for transportation;Help with stairs or ramp for entrance;Supervision due to cognitive status   Can travel by private vehicle     No  Equipment Recommendations  Wheelchair (measurements PT);Wheelchair cushion (measurements PT)    Recommendations for Other Services Speech consult     Precautions / Restrictions Precautions Precautions: Fall Recall of Precautions/Restrictions: Impaired Precaution/Restrictions Comments: alarm belt in bed, foley, rectal tube, Cortrak and some swallowing issues observed (RN/SLP notified) Restrictions Weight Bearing Restrictions Per Provider Order: No     Mobility  Bed Mobility Overal bed mobility: Needs Assistance Bed Mobility: Rolling, Sidelying to Sit Rolling: Min assist, Used rails Sidelying to sit: Min assist, HOB elevated, Used rails Supine to sit: Mod assist, Min assist, HOB elevated, Used rails Sit to supine: Min assist, HOB elevated, Used rails   General bed mobility comments: Increased time/effort to perform, good LE activation, needs sequencing cues for better utilize rail and minA for trunk raise from elevated HOB surface.    Transfers Overall transfer level: Needs assistance Equipment used: Ambulation equipment used Transfers: Sit to/from Stand, Bed to chair/wheelchair/BSC Sit to Stand: Max assist, Mod assist, +2 physical assistance, From elevated surface, Via lift equipment           General transfer comment: elevated bed>Stedy, Stedy>chair, gait belt and bed pad assist to boost up, standing ~20-30 seconds befoer c/o LE fatigue and needing to sit down Transfer via Lift Equipment: Stedy  Ambulation/Gait Ambulation/Gait assistance: +2 physical assistance             General Gait Details: standing weight shift x5 reps in [place] with +2 for safety prior to sitting  Stairs             Wheelchair Mobility     Tilt Bed    Modified Rankin (Stroke Patients Only)       Balance Overall balance  assessment: Needs assistance Sitting-balance support: Feet supported Sitting balance-Leahy Scale: Fair Sitting balance - Comments: EOB   Standing balance support: Bilateral upper extremity supported, Reliant on assistive device for balance Standing balance-Leahy Scale: Zero Standing balance comment: +2 mod/maxA for safety standing in [employer] Communication Communication: Impaired Factors Affecting Communication: Hearing impaired;Reduced clarity of speech  Cognition Arousal: Alert Behavior During Therapy: WFL for tasks assessed/performed   PT - Cognitive impairments: Safety/Judgement, Sequencing, Problem solving                       PT - Cognition Comments: Pt alert, oriented to self, hospital, able to state his wife's name. Cooperative today. Following commands: Impaired Following commands impaired: Follows one step commands with increased time, Only follows one step commands consistently    Cueing Cueing Techniques: Verbal cues, Gestural cues, Tactile cues  Exercises General Exercises - Lower Extremity Ankle Circles/Pumps: AROM, Both, 10 reps (bed chair posture) Long Arc Quad: AROM, Both, 10 reps, AAROM (bed chair posture, AA on RLE) Hip Flexion/Marching: AROM, Both, 10 reps (bed in chair posture) Other Exercises Other Exercises: cues for pursed lip breathing x5 reps x2 sets    General Comments General comments (skin integrity, edema, etc.): see VS in comments above      Pertinent Vitals/Pain Pain Assessment Pain Assessment: Faces Faces Pain Scale: Hurts a little bit Breathing: normal Negative Vocalization: none Facial Expression: smiling or inexpressive Body Language: relaxed Consolability: no need to console PAINAD Score: 0 Pain Location: no specific c/o, intermittent grimacing Pain Descriptors / Indicators: Discomfort, Grimacing Pain Intervention(s): Limited activity within patient's tolerance, Monitored  during session, Repositioned    Home Living                          Prior Function            PT Goals (current goals can now be found in the care plan section) Acute Rehab PT Goals Patient Stated Goal: to be able to get up, to sit in the sunlight PT Goal Formulation: With patient Time For Goal Achievement: 06/22/24 Progress towards PT goals: Progressing toward goals    Frequency    Min 2X/week      PT Plan      Co-evaluation              AM-PAC PT 6 Clicks Mobility   Outcome Measure  Help needed turning from your back to your side while in a flat bed without using bedrails?: A Lot Help needed moving from lying on your back to sitting on the side of a flat bed without using bedrails?: A Lot Help needed moving to and from a bed to a chair (including a wheelchair)?: Total Help needed standing up from a chair using your arms (e.g., wheelchair or bedside chair)?: A Lot Help needed to walk in hospital room?: Total Help needed climbing 3-5 steps with a railing? : Total 6 Click Score: 9    End of Session Equipment Utilized During Treatment: Gait belt;Oxygen  (pt received on RA, on 2L at end  of session due to desat) Activity Tolerance: Patient tolerated treatment well Patient left: with call bell/phone within reach;Other (comment);in chair;with chair alarm set;with nursing/sitter in room (NT arriving to give CHG bath) Nurse Communication: Mobility status;Need for lift equipment;Precautions (rec Stedy; hypoxia after drinking sip of water and coughing, needed to go back on 2L [place] SLP/RN/MD notified) PT Visit Diagnosis: Unsteadiness on feet (R26.81);Other abnormalities of gait and mobility (R26.89);Muscle weakness (generalized) (M62.81)     Time: 8980-8945 PT Time Calculation (min) (ACUTE ONLY): 35 min  Charges:    $Therapeutic Activity: 23-37 mins PT General Charges $$ ACUTE PT VISIT: 1 Visit                     Denissa Cozart P., PTA Acute Rehabilitation  Services Secure Chat Preferred 9a-5:30pm Office: [phone]    Connell HERO [facility] 06/18/2024, 1:11 PM

## 2024-06-18 NOTE — Plan of Care (Signed)
  Problem: Education: Goal: Knowledge of General Education information will improve Description: Including pain rating scale, medication(s)/side effects and non-pharmacologic comfort measures Outcome: Progressing   Problem: Clinical Measurements: Goal: Ability to maintain clinical measurements within normal limits will improve Outcome: Progressing   Problem: Coping: Goal: Level of anxiety will decrease Outcome: Progressing   Problem: Pain Managment: Goal: General experience of comfort will improve and/or be controlled Outcome: Progressing   Problem: Fluid Volume: Goal: Ability to maintain a balanced intake and output will improve Outcome: Progressing   Problem: Nutritional: Goal: Maintenance of adequate nutrition will improve Outcome: Progressing   Problem: Tissue Perfusion: Goal: Adequacy of tissue perfusion will improve Outcome: Progressing

## 2024-06-18 NOTE — Progress Notes (Signed)
 Progress Note   Patient: Martin Ford FMW:[id number] DOB: Aug 22, 1936 DOA: 06/01/2024     17 DOS: the patient was seen and examined on 06/18/2024   Brief hospital course: Patient with PMH of COPD, GERD, chronic respiratory failure on 2 LPM, aortic stenosis presented to the hospital with complaints of confusion and shortness of breath. He sees Dr. Darlean and just saw him as an outpatient on 05/14/24 and was prescribed a prednisone  taper and 10 day course of Doxycycline .   On 9/29, he had worsening dyspnea despite increase in O2 from baseline 2L to 4L. He was taken to [facility] ED where he required intubation. He was given 1 dose of Vancomycin and Cefepime empirically.   CTA chest demonstrated Bronchiectasis, no PE. CT head demonstrated no acute issues, generalized atrophy and sinus thickening.   He was later transferred to [facility] for further management.   Assessment and Plan:  Acute on chronic hypoxic/hypercapnic respiratory failure 2/2 AECOPD - COPD Aspiration pneumonia. Required intubation on 9/29. Uses 2 LPM chronically at home. Currently on 2 LPM. Was on BiPAP after extubation. Patient appears to be having very poor cough. Chest PT was ordered.  Currently discontinued by respiratory therapist. Difficulty use BiPAP in the setting of Cortrak. S/P IV Unasyn. S/P Solu-Medrol, continue on Prednisone   and nebulizer therapy.   AKI: resolved Creatinine went to 2.24. Post diuresis.back to baseline at 0.84 Renal function improving.  normal values.   Hypernatremia. From poor p.o. intake. Treated with free water and D10. Continue free water.   Mitral valve prolapse. Moderate aortic stenosis. Moderate aortic regurgitation. ICU discussed with cardiologist Dr  Ren Maha.  Recommended to follow as an outpatient This renders very poor prognosis for the patient's current presentation though.   Hyperglycemia/hypoglycemia Hyperglycemia presumed 2/2 steroids.  Later on become hypoglycemic as  steroids are weaned and tube feeds are stopped. Treated with D10.  Now on Cortrak.     Dysphagia. Poor p.o. intake. Secondary to patient's ongoing delirium patient is not a good candidate for swallow. SLP has been following but patient has not been able to pass swallow evaluation. Suspect prognosis is poor. There is also concern for aspiration pneumonia for which the patient is on antibiotics.   Moderate protein calorie malnutrition Dietitian consulting. Cortrak placed. Initiating tube feed.   Acute bilateral distal DVT. CTPE negative for PE. Lower extremity Doppler on 10/3 positive for bilateral distal DVT No significant edema. Initially was on Eliquis.  Now on Lovenox.   Diarrhea: Likely associated with tube feeding.  May need fiber supplements. Currently no GI symptoms. No further workup.   Delirium. Patient is somewhat confused and agitated. Required Precedex in the ICU. Resolved today, at baseline per wife Will monitor closely.   Continue Seroquel.   Goals of care conversation. Discussed with wife at bedside. Patient has a living will that states that if he is not evaluated to state he would like to be resuscitated. But in a vegetative state he would not like to be kept alive artificially in machine. Palliative care consulted.  Patient's prognosis appears to be poor. Monitor.   Moderate protein calorie malnutrition Body mass index is 19.34 kg/m.  Continue supplement. Poor prognosis for the patient.   Hypertension. Blood pressure elevated. As needed hydralazine.     Subjective: Feels better, no fever or chills  Physical Exam: Vitals:   06/18/24 0752 06/18/24 0835 06/18/24 0936 06/18/24 1054  BP: 137/60     Pulse: 82 82    Resp: (!) 23 (!)  38    Temp: (!) 97.4 F (36.3 C)     TempSrc: Oral     SpO2: 99% 98% 100% 100%  Weight:      Height:       Constitutional: Alert, awake, calm, comfortable HEENT: Neck supple Respiratory: clear to auscultation  bilaterally, no wheezing, no crackles. Normal respiratory effort. No accessory muscle use.  Cardiovascular: Regular rate and rhythm, no murmurs / rubs / gallops. No extremity edema. 2+ pedal pulses. No carotid bruits.  Abdomen: no tenderness, no masses palpated. No hepatosplenomegaly. Bowel sounds positive.  Musculoskeletal: no clubbing / cyanosis. No joint deformity upper and lower extremities. Good ROM, no contractures. Normal muscle tone.  Skin: no rashes, lesions, ulcers. No induration Neurologic: CN 2-12 grossly intact. Sensation intact, DTR normal. Strength 5/5 x all 4 extremities.  Psychiatric: Normal judgment and insight. Alert and oriented x 3. Normal mood.   Data Reviewed:  Reviewed  Family Communication: Wife at bedside  Disposition: Status is: Inpatient Remains inpatient appropriate because: Ongoing recovery from delirium and dysphagia  Planned Discharge Destination: Skilled nursing facility vs hospice    Time spent: 35 minutes  Author: Nena Rebel, MD 06/18/2024 3:20 PM  For on call review [email or web address].

## 2024-06-18 NOTE — Progress Notes (Signed)
 Physical Therapy Treatment Patient Details Name: Martin Ford MRN: [id number] DOB: [birth year] Today's Date: 06/18/2024   History of Present Illness The pt is an 88 yo male presenting to [facility] 9/29 for SOB and progressive AMS. Pt intubated 9/29 and transferred to [facility] 9/30, extubated 10/3. DVT found 10/3. PMH includes: COPD stage 3 chronically on 2L O2, HTN, HLD, hypothyroidism, PVD, and essential tremor.    PT Comments  Pt received in supine, alert and agreeable to therapy session, pt A&O to self, location, somewhat to situation. Pt with good participation in seated LE exercises and transfers to long sitting while bed placed in chair posture at end of session. Pillows positioned to optimize upright posture due to recent hx dysphagia and for improved pulmonary clearance as meal tray arriving for his breakfast. Plan to return later in day to work on OOB transfer training once +2 assist available. SpO2/HR WFL on RA during session. Patient will benefit from continued inpatient follow up therapy, <3 hours/day.     If plan is discharge home, recommend the following: Two people to help with walking and/or transfers;Two people to help with bathing/dressing/bathroom;Assistance with cooking/housework;Assistance with feeding;Direct supervision/assist for medications management;Direct supervision/assist for financial management;Assist for transportation;Help with stairs or ramp for entrance;Supervision due to cognitive status   Can travel by private vehicle     No  Equipment Recommendations  Wheelchair (measurements PT);Wheelchair cushion (measurements PT)    Recommendations for Other Services       Precautions / Restrictions Precautions Precautions: Fall Recall of Precautions/Restrictions: Impaired Precaution/Restrictions Comments: alarm belt in bed, foley, rectal tube, Cortrak and some swallowing issues observed (RN/SLP notified) Restrictions Weight Bearing Restrictions Per Provider Order: No      Mobility  Bed Mobility Overal bed mobility: Needs Assistance Bed Mobility: Supine to Sit, Sit to Supine     Supine to sit: Mod assist, Min assist, HOB elevated, Used rails Sit to supine: Min assist, HOB elevated, Used rails   General bed mobility comments: supine to long sitting x3 reps, pillows adjusted behind him while pt sitting upright, pt using R bed rail to pull forward    Transfers Overall transfer level: Needs assistance                 General transfer comment: pt food tray arriving to room so defer, pt agreeable to sit and eat breakfast prior to EOB/standing trials.    Ambulation/Gait                   Stairs             Wheelchair Mobility     Tilt Bed    Modified Rankin (Stroke Patients Only)       Balance Overall balance assessment: Needs assistance Sitting-balance support: Feet supported Sitting balance-Leahy Scale: Fair Sitting balance - Comments: using R bed rail for long sitting       Standing balance comment: defer to later in day, pt food tray arriving and he request to eat                            Communication Communication Communication: Impaired Factors Affecting Communication: Hearing impaired;Reduced clarity of speech  Cognition Arousal: Alert Behavior During Therapy: WFL for tasks assessed/performed   PT - Cognitive impairments: Safety/Judgement, Sequencing, Problem solving                       PT - Cognition  Comments: Pt alert, oriented to self, hospital, able to state his wife's name. Cooperative today. Following commands: Impaired Following commands impaired: Follows one step commands with increased time, Only follows one step commands consistently    Cueing Cueing Techniques: Verbal cues, Gestural cues, Tactile cues  Exercises General Exercises - Lower Extremity Ankle Circles/Pumps: AROM, Both, 10 reps (bed chair posture) Long Arc Quad: AROM, Both, 10 reps, AAROM (bed chair  posture, AA on RLE) Hip Flexion/Marching: AROM, Both, 10 reps (bed in chair posture) Other Exercises Other Exercises: bed crunches x3 reps, pulling to long sit from elevated HOB with RUE on bed rail    General Comments General comments (skin integrity, edema, etc.): SpO2 WFL on RA, BP 127/56 HR 85-94 and SpO2 100% on RA with supine repositioning into bed chair posture for improved pulmonary clearance while eating his meal      Pertinent Vitals/Pain Pain Assessment Pain Assessment: Faces Faces Pain Scale: Hurts a little bit Breathing: normal Negative Vocalization: none Facial Expression: smiling or inexpressive Body Language: relaxed Consolability: no need to console PAINAD Score: 0 Pain Location: no specific c/o, intermittent grimacing Pain Descriptors / Indicators: Discomfort, Grimacing Pain Intervention(s): Limited activity within patient's tolerance, Monitored during session, Repositioned    Home Living                          Prior Function            PT Goals (current goals can now be found in the care plan section) Acute Rehab PT Goals Patient Stated Goal: to be able to get up, to sit in the sunlight PT Goal Formulation: With patient Time For Goal Achievement: 06/22/24 Progress towards PT goals: Progressing toward goals    Frequency    Min 2X/week      PT Plan      Co-evaluation              AM-PAC PT 6 Clicks Mobility   Outcome Measure  Help needed turning from your back to your side while in a flat bed without using bedrails?: A Lot Help needed moving from lying on your back to sitting on the side of a flat bed without using bedrails?: A Lot Help needed moving to and from a bed to a chair (including a wheelchair)?: Total Help needed standing up from a chair using your arms (e.g., wheelchair or bedside chair)?: A Lot Help needed to walk in hospital room?: Total Help needed climbing 3-5 steps with a railing? : Total 6 Click Score:  9    End of Session Equipment Utilized During Treatment:  (pt received on RA) Activity Tolerance: Patient tolerated treatment well;Other (comment) (breakfast arriving so pt set up to eat first) Patient left: in bed;with call bell/phone within reach;with bed alarm set;Other (comment) (HOB fully elevated to 64 deg, pillow behind lower back, set up to eat lunch tray) Nurse Communication: Mobility status;Need for lift equipment (rec [place]) PT Visit Diagnosis: Unsteadiness on feet (R26.81);Other abnormalities of gait and mobility (R26.89);Muscle weakness (generalized) (M62.81)     Time: 9067-9059 PT Time Calculation (min) (ACUTE ONLY): 8 min  Charges:    $Therapeutic Exercise: 8-22 mins PT General Charges $$ ACUTE PT VISIT: 1 Visit                     Salah Nakamura P., PTA Acute Rehabilitation Services Secure Chat Preferred 9a-5:30pm Office: [phone]    Connell HERO [facility] 06/18/2024, 1:02 PM

## 2024-06-18 NOTE — Progress Notes (Signed)
 Speech Language Pathology Treatment: Dysphagia  Patient Details Name: Colen Eltzroth MRN: [id number] DOB: [birth year] Today's Date: 06/18/2024 Time: 8451-8398 SLP Time Calculation (min) (ACUTE ONLY): 13 min  Assessment / Plan / Recommendation Clinical Impression  SLP reconsulted after coughing while drinking via cup with PT earlier this date. His mentation is significantly improved since most recent visit and he fed himself cup sips of water and regular solids without overt s/s of dysphagia or aspiration. Since aspiration was sensed on the MBS 10/10, coughing is a reliable indication of this occurring but multiple factors may influence his presentation including positioning and level of alertness. After discussing further with his spouse and RN, they report noticing increased coughing with solids and pt reports globus frequently. Of note, the barium tablet moved slowly through the esophagus without visualized clearance into the stomach per MBS note. May consider more dedicated esophageal assessment as it may also be impacting his appetite and intake. SLP will continue following with recommendations to continue current diet with thin liquids via cup only (no straws).     HPI HPI: Pt is a 88 y.o. male who presented with worsening dyspnea despite increase in O2 from baseline 2L to 4L. He was taken to [facility] ED where he required intubation. CTA chest demonstrated Bronchiectasis, no PE. CT head demonstrated no acute issues, generalized atrophy and sinus thickening. ETT 9/29- 10/3 (5 days). Failed [facility] Swallow Screen 10/4. PMH: COPD Stage 3 by GOLD (PFT's Dec 2022 with FEV1 0.95 (38%), ratio 0.48) and chronically on 2L O2.      SLP Plan  Continue with current plan of care          Recommendations  Diet recommendations: Dysphagia 3 (mechanical soft);Thin liquid Liquids provided via: Cup Medication Administration: Whole meds with puree Supervision: Full supervision/cueing for compensatory  strategies;Staff to assist with self feeding Compensations: Minimize environmental distractions;Slow rate;Small sips/bites                  Oral care BID   Frequent or constant Supervision/Assistance Dysphagia, pharyngoesophageal phase (R13.14)     Continue with current plan of care     Damien Blumenthal, M.A., CCC-SLP Speech Language Pathology, Acute Rehabilitation Services  Secure Chat preferred [phone]   06/18/2024, 4:24 PM

## 2024-06-18 NOTE — Plan of Care (Signed)
  Problem: Education: Goal: Knowledge of General Education information will improve Description: Including pain rating scale, medication(s)/side effects and non-pharmacologic comfort measures Outcome: Progressing   Problem: Health Behavior/Discharge Planning: Goal: Ability to manage health-related needs will improve Outcome: Progressing   Problem: Tissue Perfusion: Goal: Adequacy of tissue perfusion will improve Outcome: Progressing   Problem: Skin Integrity: Goal: Risk for impaired skin integrity will decrease Outcome: Progressing   Problem: Nutritional: Goal: Progress toward achieving an optimal weight will improve Outcome: Progressing   Problem: Nutritional: Goal: Maintenance of adequate nutrition will improve Outcome: Progressing   Problem: Metabolic: Goal: Ability to maintain appropriate glucose levels will improve Outcome: Progressing   Problem: Elimination: Goal: Will not experience complications related to bowel motility Outcome: Progressing   Problem: Coping: Goal: Level of anxiety will decrease Outcome: Progressing

## 2024-06-18 NOTE — Progress Notes (Signed)
 Calorie Count Note  48 hour calorie count ordered. Day 2 complete. Patient remains with cortrak in place, tube feeds were discontinued on 10/14 and patient did not received cyclic feeds for the past two nights. Patient not meeting needs via PO intake only. Secure chat sent to MD that patient did not pass calorie count and still has cortrak in. Recommend re consult palliative care for GOC discussion with patient and spouse about about long term nutrition.   Diet: DYS 3 + thin liquids  Supplements: Magic Cup TID   Estimated Nutritional Needs:  Kcal:  1700-1900 kcal/d Protein:  80-100g/d Fluid:  >/=1.8L/d  Day 1 Breakfast: 0% Lunch: 25% mac and cheese ( 49 kcal 2 g) Dinner: 5% meatloaf (10 kcal 1g), 40% mashed potatoes (50 kcal 1g), 5% whole green beans (1 kcal 0g), 100% ice cream (130 kcal 2 g), 5% lemonade (3 kcal 0 g) Supplements: 100% Magic cup x1 (290 kcals  9g)  Day 1 Total intake: 533 kcal (31% of minimum estimated needs)  15 protein (19% of minimum estimated needs)  Day 2 Breakfast: 0% Lunch: No Ticket  Dinner: 0% Supplements: 0% Magic cup   Nutrition Dx: Moderate Malnutrition related to chronic illness (COPD) as evidenced by moderate fat depletion, moderate muscle depletion   Goal: Patient will meet greater than or equal to 90% of their needs   Intervention:  DYS 3 + thin liquids, advance per SLP  Magic cup TID with meals, each supplement provides 290 kcal and 9 grams of protein GOC discussion with patient and spouse regarding resumption of tube feeds vs removing cortrak and allowing patient to take in PO intake as able   Martin Hofmann, MS, RD, LDN Clinical Dietitian  Contact via secure chat. If unavailable, use group chat RD Inpatient.

## 2024-06-18 NOTE — Care Management Important Message (Signed)
 Important Message  Patient Details  Name: Martin Ford MRN: [id number] Date of Birth: 08/17/1936   Important Message Given:  Yes - Medicare IM     [name] Sharps 06/18/2024, 9:45 AM

## 2024-06-19 ENCOUNTER — Inpatient Hospital Stay (HOSPITAL_COMMUNITY)

## 2024-06-19 DIAGNOSIS — J42 Unspecified chronic bronchitis: Secondary | ICD-10-CM

## 2024-06-19 LAB — GLUCOSE, CAPILLARY
Glucose-Capillary: 100 mg/dL — ABNORMAL HIGH (ref 70–99)
Glucose-Capillary: 116 mg/dL — ABNORMAL HIGH (ref 70–99)
Glucose-Capillary: 126 mg/dL — ABNORMAL HIGH (ref 70–99)
Glucose-Capillary: 135 mg/dL — ABNORMAL HIGH (ref 70–99)
Glucose-Capillary: 144 mg/dL — ABNORMAL HIGH (ref 70–99)
Glucose-Capillary: 157 mg/dL — ABNORMAL HIGH (ref 70–99)
Glucose-Capillary: 96 mg/dL (ref 70–99)

## 2024-06-19 LAB — CBC
HCT: 32.7 % — ABNORMAL LOW (ref 39.0–52.0)
Hemoglobin: 10.9 g/dL — ABNORMAL LOW (ref 13.0–17.0)
MCH: 31.6 pg (ref 26.0–34.0)
MCHC: 33.3 g/dL (ref 30.0–36.0)
MCV: 94.8 fL (ref 80.0–100.0)
Platelets: 426 K/uL — ABNORMAL HIGH (ref 150–400)
RBC: 3.45 MIL/uL — ABNORMAL LOW (ref 4.22–5.81)
RDW: 13 % (ref 11.5–15.5)
WBC: 10.6 K/uL — ABNORMAL HIGH (ref 4.0–10.5)
nRBC: 0 % (ref 0.0–0.2)

## 2024-06-19 LAB — MAGNESIUM: Magnesium: 2 mg/dL (ref 1.7–2.4)

## 2024-06-19 NOTE — Plan of Care (Signed)
  Problem: Clinical Measurements: Goal: Ability to maintain clinical measurements within normal limits will improve Outcome: Progressing Goal: Cardiovascular complication will be avoided Outcome: Progressing   Problem: Nutrition: Goal: Adequate nutrition will be maintained Outcome: Progressing   Problem: Elimination: Goal: Will not experience complications related to bowel motility Outcome: Progressing   Problem: Skin Integrity: Goal: Risk for impaired skin integrity will decrease Outcome: Progressing

## 2024-06-19 NOTE — Progress Notes (Signed)
 Palliative Medicine Progress Note   Patient Name: Martin Ford       Date: 06/19/2024 DOB: Jun 21, 1936  Age: 88 y.o. MRN#: [id number] Attending Physician: Roann Gouty, MD Primary Care Physician: Tobie Suzzane POUR, MD Admit Date: 06/01/2024   HPI/Patient Profile: 88 y.o. male  with past medical history of COPD, chronic respiratory failure on home O2, aortic stenosis, and GERD who presented to the ED on 06/01/2024 with complaint of shortness of breath and confusion.  He was admitted with acute on chronic hypoxic/hypercapnic respiratory failure secondary to AECOPD. He required intubation 9/29 and was extubated 10/3.  Hospitalization has been complicated by AKI, hypernatremia, dysphagia, malnutrition, and delirium. He has remained NPO due to decreased alertness and had cortrak placed on 10/6.   Palliative Medicine has been consulted for goals of care discussions. and complex medical decision making.  Subjective: Chart reviewed.  Update received from RN.  Patient assessed at bedside.  He is calm, cooperative, and able to answer basic questions, but does not appear to have full insight into his current medical situation.  I met with patient's wife [name] at bedside.  We reviewed patient's hospital course and current clinical condition.  We also reviewed assessment and recommendations from SLP and RD, emphasizing that patient is not meeting his nutritional needs. We discussed the benefits and burdens of artifical feeding, emphasizing there is no guarantee this would improve quantity or quality of life. Discussed that earlier today, patient stated to RD that he did not want Cortrak replaced. [name] agrees that she does not want to pursue artifical feeding, either now or in the future.   We also reviewed code  status. Teretha states that after further consideration from our previous discussions, and after reading the CPR chapter in Hard choices book, she agrees with DNR status.  [name] remains hopeful for continued improvement. She confirms plan to pursue rehab with the hope patient's functional status will improve enough that he can ultimately return home.  Objective:  Physical Exam Vitals reviewed.  Constitutional:      General: He is not in acute distress.    Comments: Chronically ill-appearing  Pulmonary:     Effort: Pulmonary effort is normal.  Neurological:     Mental Status: He is alert.     Motor: Weakness present.     Comments: Oriented to  person and place  Psychiatric:        Cognition and Memory: Memory is impaired.                 Palliative Medicine Assessment & Plan   Assessment: Principal Problem:   COPD (chronic obstructive pulmonary disease) (HCC) Active Problems:   Acute and chronic respiratory failure (acute-on-chronic) (HCC)   Malnutrition of moderate degree    Recommendations/Plan: Code status changed to DNR with limited interventions Continue all other supportive care Wife indicates she does not wish to pursue artifical feeding Goal of care is medical stabilization, rehab to improve functional status, and ultimately to return home Ongoing palliative support  Prognosis:  Unable to determine  Discharge Planning: SNF/rehab  Care plan was discussed with Dr. Roann  Thank you for allowing the Palliative Medicine Team to assist in the care of this patient.   Time: 65 minutes  Detailed review of medical records (labs, imaging, vital signs), medically appropriate exam, discussed with treatment team, counseling and education to patient, family, & staff, documenting clinical information, coordination of care.   Signed: Maziah Keeling B Lamar Naef, NP   Please contact Palliative Medicine Team phone at [phone] for questions and concerns.  For individual  providers, please see AMION.

## 2024-06-19 NOTE — Progress Notes (Addendum)
 Progress Note   Patient: Martin Ford FMW:[id number] DOB: September 11, 1935 DOA: 06/01/2024     18 DOS: the patient was seen and examined on 06/19/2024   Brief hospital course: Patient with PMH of COPD, GERD, chronic respiratory failure on 2 LPM, aortic stenosis presented to the hospital with complaints of confusion and shortness of breath. He sees Dr. Darlean and just saw him as an outpatient on 05/14/24 and was prescribed a prednisone  taper and 10 day course of Doxycycline .   On 9/29, he had worsening dyspnea despite increase in O2 from baseline 2L to 4L. He was taken to [facility] ED where he required intubation. He was given 1 dose of Vancomycin and Cefepime empirically.   CTA chest demonstrated Bronchiectasis, no PE. CT head demonstrated no acute issues, generalized atrophy and sinus thickening.   He was later [facility] to [facility] for [facility] management.   Assessment and Plan:  Acute on chronic hypoxic/hypercapnic respiratory failure 2/2 AECOPD - COPD Aspiration pneumonia. Required intubation on 9/29. Uses 2 LPM chronically at home. Currently on 2 LPM. Was on BiPAP after extubation. Patient appears to be having very poor cough. Chest PT was ordered.  Currently discontinued by respiratory therapist. Difficulty use BiPAP in the setting of Cortrak. S/P IV Unasyn. S/P Solu-Medrol, continue on Prednisone   and nebulizer therapy.   AKI: resolved Creatinine went to 2.24. Post diuresis.back to baseline at 0.84 Renal function improving.  normal values.   Hypernatremia. From poor p.o. intake. Treated with free water and D10. Continue free water.   Mitral valve prolapse. Moderate aortic stenosis. Moderate aortic regurgitation. ICU discussed with cardiologist Dr  Ren Maha.  Recommended to follow as an outpatient This renders very poor prognosis for the patient's current presentation though.   Hyperglycemia/hypoglycemia Hyperglycemia presumed 2/2 steroids.  Later on become hypoglycemic as  steroids are weaned and tube feeds are stopped. Treated with D10. S/p Cortrak removed.   Dysphagia. Poor p.o. intake. Secondary to patient's ongoing delirium patient is not a good candidate for swallow. SLP has been following but patient has not been able to pass swallow evaluation. Suspect prognosis is poor. SLP evaluation done 10/16, advised for mechanical soft diet, Cortrak removed   Moderate protein calorie malnutrition Dietitian consulting. Cortrak placed. Initiating tube feed.   Acute bilateral distal DVT. CTPE negative for PE. Lower extremity Doppler on 10/3 positive for bilateral distal DVT No significant edema. Initially was on Eliquis.  Now on Lovenox.   Diarrhea: Likely associated with tube feeding.  May need fiber supplements. Currently no GI symptoms. No further workup.   Delirium. Patient is somewhat confused and agitated. Required Precedex in the ICU. Resolved today, at baseline per wife Will monitor closely.   Continue Seroquel.   Goals of care conversation. Discussed with wife at bedside. Patient has a living will that states that if he is not evaluated to state he would like to be resuscitated. But in a vegetative state he would not like to be kept alive artificially in machine. Palliative care consulted.  Patient's prognosis appears to be poor. Monitor.   Moderate protein calorie malnutrition Body mass index is 19.34 kg/m.  Continue supplement. Poor prognosis for the patient.   Hypertension. Blood pressure elevated. As needed hydralazine.      Subjective: None  Physical Exam: Vitals:   06/19/24 0511 06/19/24 0725 06/19/24 1115 06/19/24 1619  BP: (!) 132/59 (!) 147/74 121/62 125/65  Pulse: (!) 59 87 66 71  Resp: 15 16 16 16   Temp: 97.9 F (36.6 C)  97.6 F (36.4 C) (!) 97.3 F (36.3 C) 97.7 F (36.5 C)  TempSrc: Oral Oral Axillary Oral  SpO2: 100% 91% 94% 96%  Weight:      Height:       Constitutional: Alert, awake, calm,  comfortable HEENT: Neck supple Respiratory: clear to auscultation bilaterally, no wheezing, no crackles. Normal respiratory effort. No accessory muscle use.  Cardiovascular: Regular rate and rhythm, no murmurs / rubs / gallops. No extremity edema. 2+ pedal pulses. No carotid bruits.  Abdomen: no tenderness, no masses palpated. No hepatosplenomegaly. Bowel sounds positive.  Musculoskeletal: no clubbing / cyanosis. No joint deformity upper and lower extremities. Good ROM, no contractures. Normal muscle tone.  Skin: no rashes, lesions, ulcers. No induration Neurologic: CN 2-12 grossly intact. Sensation intact, DTR normal. Strength 5/5 x all 4 extremities.  Psychiatric: Normal judgment and insight. Alert and oriented x 3. Normal mood.   Data Reviewed:  Reviewed  Family Communication: wife 10/16  Disposition: Status is: Inpatient Remains inpatient appropriate because: Ongoing recovery from deconditioning, requiring nursing home placement  Planned Discharge Destination: Skilled nursing facility    Time spent: 35 minutes  Author: Nena Rebel, MD 06/19/2024 4:35 PM  For on call review [email or web address].

## 2024-06-19 NOTE — Plan of Care (Signed)
  Problem: Education: Goal: Knowledge of General Education information will improve Description: Including pain rating scale, medication(s)/side effects and non-pharmacologic comfort measures Outcome: Progressing   Problem: Health Behavior/Discharge Planning: Goal: Ability to manage health-related needs will improve Outcome: Progressing   Problem: Clinical Measurements: Goal: Ability to maintain clinical measurements within normal limits will improve Outcome: Progressing Goal: Will remain free from infection Outcome: Progressing Goal: Diagnostic test results will improve Outcome: Progressing Goal: Respiratory complications will improve Outcome: Progressing Goal: Cardiovascular complication will be avoided Outcome: Progressing   Problem: Activity: Goal: Risk for activity intolerance will decrease Outcome: Progressing   Problem: Nutrition: Goal: Adequate nutrition will be maintained Outcome: Progressing   Problem: Coping: Goal: Level of anxiety will decrease Outcome: Progressing   Problem: Elimination: Goal: Will not experience complications related to bowel motility Outcome: Progressing Goal: Will not experience complications related to urinary retention Outcome: Progressing   Problem: Pain Managment: Goal: General experience of comfort will improve and/or be controlled Outcome: Progressing   Problem: Safety: Goal: Ability to remain free from injury will improve Outcome: Progressing   Problem: Skin Integrity: Goal: Risk for impaired skin integrity will decrease Outcome: Progressing   Problem: Safety: Goal: Non-violent Restraint(s) Outcome: Progressing   Problem: Education: Goal: Ability to describe self-care measures that may prevent or decrease complications (Diabetes Survival Skills Education) will improve Outcome: Progressing Goal: Individualized Educational Video(s) Outcome: Progressing   Problem: Coping: Goal: Ability to adjust to condition or change  in health will improve Outcome: Progressing   Problem: Fluid Volume: Goal: Ability to maintain a balanced intake and output will improve Outcome: Progressing   Problem: Health Behavior/Discharge Planning: Goal: Ability to identify and utilize available resources and services will improve Outcome: Progressing Goal: Ability to manage health-related needs will improve Outcome: Progressing   Problem: Metabolic: Goal: Ability to maintain appropriate glucose levels will improve Outcome: Progressing   Problem: Nutritional: Goal: Maintenance of adequate nutrition will improve Outcome: Progressing Goal: Progress toward achieving an optimal weight will improve Outcome: Progressing   Problem: Skin Integrity: Goal: Risk for impaired skin integrity will decrease Outcome: Progressing   Problem: Tissue Perfusion: Goal: Adequacy of tissue perfusion will improve Outcome: Progressing

## 2024-06-19 NOTE — TOC Progression Note (Signed)
 Transition of Care [facility]) - Progression Note    Patient Details  Name: Martin Ford MRN: [id number] Date of Birth: Jan 18, 1936  Transition of Care [facility]) CM/SW Contact  Inocente GORMAN Kindle, LCSW Phone Number: 06/19/2024, 7:11 PM  Clinical Narrative:    CSW received consult for possible SNF placement at time of discharge. CSW spoke with patient's spouse at bedside. Patient's spouse asked how patient was doing with mobility. CSW read PT note to her. She reported that patient's spouse is currently unable to care for patient at their home given patient's current physical needs and fall risk. Patient expressed understanding of PT recommendation and is agreeable to SNF placement at time of discharge. Patient reports preference for [facility]. CSW discussed insurance authorization process and will provide Medicare SNF ratings list. CSW will send out referrals for review and provide bed offers as available.   CSW provided SNF bed offers and spouse requested 1-[facility] and 2-[facility].   CSW confirmed that [facility] can accept patient. CSW submitted clinicals for insurance review.    Insurance approval received, Ref# [id number], effective until 06/23/24. [facility] can accept patient tomorrow.  CSW updated MD and patient's spouse who requested [facility] for transport. DNR on hard chart to be signed.    Skilled Nursing Rehab Facilities-   [email or web address]   Ratings out of 5 stars (5 the highest)  Name Address  Phone # Quality Care Staffing Health Inspection Overall  [facility] [address] [facility] [address] [phone] 2 1 2 1   [facility] [address] [place] [phone] 5 2 4 5   [facility] [address], [place] [phone] 2 1 1 1   [facility] [address] [place] [phone] 4 3 4 4   [facility]  [address], [place] [phone] 4 3 5 5   [facility] [address] [facility] [place] [place]  [phone] 5 3 2 3   [facility] [address] [place] [phone] 5 1 2 2   [facility] [phone] N[address], [place] [phone] 3 4 4 4   [address] ([place]) [address] [place] [phone] 1 3 3 2   [facility] [address] [place], [place] [phone] 4 2 2 2   [facility] [place] [facility] [address], [place] [phone] 2 1 1 1   Clotilda Pereyra [address] Arlana [place] [phone] 2 4 4 4   [facility] [address] [place] [phone] 3 1 3 2   [facility]) [address], [place] [phone] 1 2 4 3           [facility] [address], [place] [phone] 3 1 5 4   [facility] [address], [place] [phone] 4 2 1 1   [facility]  [address] [place] [phone] 2 4 1 1   [facility] [address] [phone] 2 2 5 5   [facility] [address] [place] [address], [place] [phone] 2 2 3 3           [facility] [address] [place] [phone] 2 1 2 1   [facility] (No [facility]) [address], [place] [phone] 4 3 4 4   [facility] [address], [facility] [phone] 3  5 5   [facility] [address], [place] [phone] 4 2 1 1   Marionette Milian [address] [place] [phone] 3 1 2 1   [facility] [address] [place] [phone] 1 3 3 2   [facility] [address]  [address] [place] [phone] 2 2 3 3   [facility] [address] [place], [place] [phone] 2 1 4 3   [facility] [address] [facility] [phone] 2 1 1 1   [facility] [address] [place], [place] [phone] 2 1 2 1   [facility] [address] [place] [phone] 3 1 2 1           [facility] [address], [place] [phone] 4 1 3 2   [facility] [address], [place]  [phone] 2 4 4 4   [facility]  (No [employer]) [address] [place] [phone] 3 2 5 5   [facility]) [address], [place] [phone] 3 2 3 3   [facility] [address], [place] [phone] 5 3 5 5   [facility] [address], [place] [phone] 2 1 1 1   [facility] [address] [place], [place] [phone] 3 5 5 5           [facility] [address] Deitra [place] [phone] 5 4 5 5   [facility])  [address] [place] [phone] 1 1 2 1   [facility]) [address] [place] [phone]  2 4 4   [facility] [address] [place] [phone] 3 5 5 5   [address] [place], [place] [phone] 4 2 2 2   [facility]) [address] [place] [phone] 1 1 3 1   [facility] [address], [place] [phone] 2 2 2 2       Expected Discharge Plan: Skilled Nursing Facility Barriers to Discharge: Continued Medical Work up               Expected Discharge Plan and Services In-house Referral: Clinical Social Work   Post Acute Care Choice: Skilled Nursing Facility Living arrangements for the past 2 months: Single Family Home                                       Social Drivers of Health (SDOH) Interventions SDOH Screenings   Food Insecurity: No Food Insecurity (06/01/2022)  Housing: Low Risk  (06/06/2024)  Transportation Needs: No Transportation Needs (06/06/2024)  Utilities: Patient Unable To Answer (06/03/2024)  Alcohol Screen: Low Risk  (06/01/2022)  Depression (PHQ2-9): Low Risk  (02/21/2024)  Financial Resource Strain: Low Risk  (06/01/2022)  Physical Activity: Inactive (06/01/2022)  Social Connections: Unknown (06/01/2022)  Stress: No Stress Concern Present (06/01/2022)  Tobacco Use: Medium Risk (06/02/2024)    Readmission Risk Interventions     No data to display

## 2024-06-19 NOTE — NC FL2 (Signed)
 [place]  MEDICAID FL2 LEVEL OF CARE FORM     IDENTIFICATION  Patient Name: Martin Ford Birthdate: [birth year] Sex: male Admission Date (Current Location): 06/01/2024  [facility] and [place] Number:  Producer, television/film/video and Address:  [facility], [address], [place], [place] [address]      Provider Number: [id number]  Attending Physician Name and Address:  Roann Gouty, MD  Relative Name and Phone Number:  Clementina Ruth (spouse) [phone]    Current Level of Care: Hospital Recommended Level of Care: Skilled Nursing Facility Prior Approval Number:    Date Approved/Denied:   PASRR Number: [id number]  Discharge Plan: SNF    Current Diagnoses: Patient Active Problem List   Diagnosis Date Noted   Acute and chronic respiratory failure (acute-on-chronic) (HCC) 06/02/2024   Malnutrition of moderate degree 06/02/2024   COPD (chronic obstructive pulmonary disease) (HCC) 06/01/2024   COPD with acute exacerbation (HCC) 02/23/2024   Refused influenza vaccine 06/14/2022   Pulmonary nodule 06/14/2022   Ascending aortic aneurysm 04/18/2022   DOE (dyspnea on exertion) 12/20/2021   Chronic respiratory failure with hypoxia (HCC) 12/20/2021   Stage 3 severe COPD by GOLD classification (HCC) 09/06/2021   Mixed hyperlipidemia 08/21/2021   Hypothyroidism 08/21/2021   Prediabetes 08/21/2021   PVD (peripheral vascular disease) 08/21/2021   Centrilobular emphysema (HCC) 05/23/2021   Essential tremor 05/23/2021   Essential hypertension 05/23/2021   Vitamin D deficiency 05/23/2021    Orientation RESPIRATION BLADDER Height & Weight     Self, Place  O2 (2L nasal cannula) Incontinent, Indwelling catheter Weight: 145 lb 11.6 oz (66.1 kg) Height:  5' 11 (180.3 cm)  BEHAVIORAL SYMPTOMS/MOOD NEUROLOGICAL BOWEL NUTRITION STATUS      Incontinent Diet (Please see discharge summary)  AMBULATORY STATUS COMMUNICATION OF NEEDS Skin   Extensive Assist Verbally Normal                        Personal Care Assistance Level of Assistance  Bathing, Feeding, Dressing Bathing Assistance: Maximum assistance Feeding assistance: Limited assistance Dressing Assistance: Maximum assistance     Functional Limitations Info  Sight, Hearing, Speech Sight Info: Impaired Hearing Info: Impaired Speech Info: Adequate    SPECIAL CARE FACTORS FREQUENCY  PT (By licensed PT), OT (By licensed OT)     PT Frequency: 5x min weekly OT Frequency: 5x min weekly            Contractures Contractures Info: Not present    Additional Factors Info  Code Status, Allergies, Psychotropic Code Status Info: FULL Allergies Info: NKA Psychotropic Info: QUEtiapine (SEROQUEL) tablet 25 mg every evening         Current Medications (06/19/2024):  This is the current hospital active medication list Current Facility-Administered Medications  Medication Dose Route Frequency Provider Last Rate Last Admin   acetaminophen (TYLENOL) tablet 650 mg  650 mg Per Tube Q6H PRN Claudene Agent, MD   650 mg at 06/08/24 2332   apixaban (ELIQUIS) tablet 5 mg  5 mg Per Tube BID Lama, Gagan S, MD   5 mg at 06/19/24 0904   budesonide (PULMICORT) nebulizer solution 0.25 mg  0.25 mg Nebulization BID Pawar, Rahul, MD   0.25 mg at 06/19/24 0900   Chlorhexidine Gluconate Cloth 2 % PADS 6 each  6 each Topical Daily Maree Harder, MD   6 each at 06/19/24 1000   famotidine  (PEPCID ) 40 MG/5ML suspension 20 mg  20 mg Per Tube Daily Patel, Pranav M,  MD   20 mg at 06/19/24 0904   free water 150 mL  150 mL Per Tube QHS Drusilla Sabas RAMAN, MD   150 mL at 06/18/24 2128   guaiFENesin tablet 400 mg  400 mg Per Tube Q8H Patel, Pranav M, MD   400 mg at 06/19/24 0904   haloperidol lactate (HALDOL) injection 2 mg  2 mg Intravenous Q6H PRN Patel, Pranav M, MD   2 mg at 06/16/24 2037   hydrALAZINE (APRESOLINE) injection 10 mg  10 mg Intravenous Q4H PRN Patel, Pranav M, MD   10 mg at 06/17/24 1728   Influenza vac split  trivalent PF (FLUZONE HIGH-DOSE) injection 0.5 mL  0.5 mL Intramuscular Prior to discharge Meliton Cree, RN       ipratropium-albuterol  (DUONEB) 0.5-2.5 (3) MG/3ML nebulizer solution 3 mL  3 mL Nebulization Q4H PRN Maree Harder, MD   3 mL at 06/17/24 1732   labetalol (NORMODYNE) injection 10 mg  10 mg Intravenous Q2H PRN Paliwal, Ria, MD   10 mg at 06/10/24 2337   ondansetron (ZOFRAN) injection 4 mg  4 mg Intravenous Q6H PRN Maree Harder, MD       Oral care mouth rinse  15 mL Mouth Rinse PRN Maree Harder, MD   15 mL at 06/10/24 1000   pneumococcal 20-valent conjugate vaccine (PREVNAR 20) injection 0.5 mL  0.5 mL Intramuscular Prior to discharge Meliton Cree, RN       [START ON 06/20/2024] predniSONE  (DELTASONE ) tablet 10 mg  10 mg Oral Q breakfast [place], Gagan S, MD       QUEtiapine (SEROQUEL) tablet 50 mg  50 mg Oral QPM Segars, Jonathan, MD   50 mg at 06/18/24 1745     Discharge Medications: Please see discharge summary for a list of discharge medications.  Relevant Imaging Results:  Relevant Lab Results:   Additional Information [id number]  Jannie Doyle S Afrika Brick, LCSW

## 2024-06-19 NOTE — Progress Notes (Signed)
 Nutrition Follow-up  DOCUMENTATION CODES:  Non-severe (moderate) malnutrition in context of chronic illness  INTERVENTION:  DYS 3 + thin liquids, advance per SLP  Magic cup TID with meals, each supplement provides 290 kcal and 9 grams of protein Recommend remove cortrak as feeds have been off for > 3 days. Recommend further GOC discussion regarding resumption of tube feeds vs allowing patient to take in PO intake only as able with both patient and spouse as patient has been having waxing/waning mentation.   NUTRITION DIAGNOSIS:  Moderate Malnutrition related to chronic illness (COPD) as evidenced by moderate fat depletion, moderate muscle depletion. - ongoing   GOAL:  Patient will meet greater than or equal to 90% of their needs - not met, progressing   MONITOR:  PO intake, Supplement acceptance  REASON FOR ASSESSMENT:  Consult Enteral/tube feeding initiation and management  ASSESSMENT:  Pt with hx of COPD and HTN presented to ED with respiratory distress. Workup suggestive of COPD exacerbation, intubated in ED.    10/3 - Extubated  10/6 cortrak placed  10/13 calorie count started  10/16 calorie count completed (inadequate)   Patient seen in room, finishing breakfast. Pt ate 50% of eggs, 0% grits, 100% magic cup and some applesauce. Pt reports feeling full from what he ate. Tube feeds were discontinued on 10/14, cortrak still in place. When asked about the cortrak, patient stated it was uncomfortable and did not want tube feeds again, prefers to just take PO in. No reports of nausea of abd pain with meals. Tolerating current dysphagia 3 diet, thin liquids.    7% weight loss noted in the last 3 months, not severe but concerning for age. Weight loss may be more significant as pt has pitting edema to the BLE.  Weight Information (since admission)     Date/Time Weight Weight in lbs BSA (Calculated - sq m) BMI (Calculated) Who   06/19/24 0500 66.1 kg 145.72 lbs -- 20.33 AT    06/18/24 0435 66.9 kg 147.5 lbs -- 20.58 DG   06/17/24 0500 67 kg 147.71 lbs -- 20.61 VS   06/16/24 0427 65.9 kg 145.28 lbs -- 20.27 VS   06/15/24 0330 67.2 kg 148.15 lbs -- 20.67 ED   06/14/24 0345 69.1 kg 152.34 lbs -- 21.26 ED   06/13/24 0349 68.8 kg 151.68 lbs -- 21.16 ED   06/12/24 0355 70.3 kg 154.98 lbs -- 21.63 ED   06/11/24 0314 69.9 kg 154.1 lbs -- 21.5 ED   06/10/24 0500 66.9 kg 147.49 lbs -- 20.58 TM   06/08/24 0433 62.9 kg 138.67 lbs -- 19.35 VS   06/06/24 0458 67.7 kg 149.25 lbs -- 20.83 JW   06/05/24 0500 71.2 kg 156.97 lbs -- 21.9 JW   06/04/24 0331 70.2 kg 154.76 lbs -- 21.59 EM   06/03/24 0431 71 kg 156.53 lbs -- 21.84 EM   06/02/24 0412 74.2 kg 163.58 lbs -- 22.83 PH   06/01/24 2056 68 kg 150 lbs 1.85 sq meters 20.93 TD   Nutritionally Relevant Medications: Scheduled Meds reviewed.   Labs: CBG 96-145, sodium 132, glucose 144, BUN 26   NUTRITION - FOCUSED PHYSICAL EXAM: Flowsheet Row Most Recent Value  Orbital Region Mild depletion  Upper Arm Region Severe depletion  Thoracic and Lumbar Region Moderate depletion  Buccal Region Mild depletion  Temple Region Moderate depletion  Clavicle Bone Region Moderate depletion  Clavicle and Acromion Bone Region Severe depletion  Scapular Bone Region Severe depletion  Dorsal Hand Mild depletion  Patellar  Region Moderate depletion  Anterior Thigh Region Moderate depletion  Posterior Calf Region Mild depletion  Edema (RD Assessment) Moderate  [BLE]  Hair Reviewed  Eyes Reviewed  Mouth Reviewed  Skin Reviewed  Nails Reviewed    Diet Order:   Diet Order             DIET DYS 3 Room service appropriate? Yes with Assist; Fluid consistency: Thin  Diet effective now                   EDUCATION NEEDS:  Not appropriate for education at this time  Skin:  Skin Assessment: Reviewed RN Assessment  Last BM:  10/14  Height:  Ht Readings from Last 1 Encounters:  06/01/24 5' 11 (1.803 m)    Weight:  Wt  Readings from Last 1 Encounters:  06/19/24 66.1 kg    Ideal Body Weight:  78.2 kg  BMI:  Body mass index is 20.32 kg/m.  Estimated Nutritional Needs:  Kcal:  1700-1900 kcal/d Protein:  80-100g/d Fluid:  >/=1.8L/d  Madalyn Potters, MS, RD, LDN Clinical Dietitian  Please see [facility] for contact information.

## 2024-06-19 NOTE — Progress Notes (Signed)
 OT Cancellation Note  Patient Details Name: Martin Ford MRN: [id number] DOB: 12-Jul-1936   Cancelled Treatment:    Reason Eval/Treat Not Completed: Patient at procedure or test/ unavailable.  Will check back as schedule allows.   [name]  06/19/2024, 2:11 PM

## 2024-06-20 LAB — GLUCOSE, CAPILLARY
Glucose-Capillary: 96 mg/dL (ref 70–99)
Glucose-Capillary: 120 mg/dL — ABNORMAL HIGH (ref 70–99)
Glucose-Capillary: 127 mg/dL — ABNORMAL HIGH (ref 70–99)
Glucose-Capillary: 136 mg/dL — ABNORMAL HIGH (ref 70–99)
Glucose-Capillary: 141 mg/dL — ABNORMAL HIGH (ref 70–99)

## 2024-06-20 MED ORDER — FAMOTIDINE 20 MG PO TABS
20.0000 mg | ORAL_TABLET | Freq: Every day | ORAL | Status: DC
  Administered 2024-06-21: 20 mg via ORAL
  Filled 2024-06-20: qty 1

## 2024-06-20 MED ORDER — APIXABAN 5 MG PO TABS: 5.0000 mg | ORAL_TABLET | Freq: Two times a day (BID) | ORAL | 0 refills | Status: AC

## 2024-06-20 MED ORDER — GUAIFENESIN 200 MG PO TABS
400.0000 mg | ORAL_TABLET | Freq: Three times a day (TID) | ORAL | Status: DC
  Administered 2024-06-20 – 2024-06-21 (×2): 400 mg via ORAL
  Filled 2024-06-20 (×5): qty 2

## 2024-06-20 MED ORDER — ORAL CARE MOUTH RINSE: 15.0000 mL | OROMUCOSAL | 0 refills | Status: AC | PRN

## 2024-06-20 MED ORDER — ACETAMINOPHEN 325 MG PO TABS: 650.0000 mg | ORAL_TABLET | Freq: Four times a day (QID) | ORAL | Status: DC | PRN

## 2024-06-20 MED ORDER — ACETAMINOPHEN 325 MG PO TABS: 650.0000 mg | ORAL_TABLET | Freq: Four times a day (QID) | ORAL | 0 refills | Status: AC | PRN

## 2024-06-20 MED ORDER — BUDESONIDE 0.25 MG/2ML IN SUSP: 0.2500 mg | Freq: Two times a day (BID) | RESPIRATORY_TRACT | 12 refills | Status: AC

## 2024-06-20 MED ORDER — QUETIAPINE FUMARATE 50 MG PO TABS: 50.0000 mg | ORAL_TABLET | Freq: Every day | ORAL | 0 refills | Status: AC

## 2024-06-20 MED ORDER — APIXABAN 5 MG PO TABS
5.0000 mg | ORAL_TABLET | Freq: Two times a day (BID) | ORAL | Status: DC
  Administered 2024-06-20 – 2024-06-21 (×2): 5 mg via ORAL
  Filled 2024-06-20 (×2): qty 1

## 2024-06-20 NOTE — Plan of Care (Signed)
  Problem: Education: Goal: Knowledge of General Education information will improve Description: Including pain rating scale, medication(s)/side effects and non-pharmacologic comfort measures Outcome: Progressing   Problem: Health Behavior/Discharge Planning: Goal: Ability to manage health-related needs will improve Outcome: Progressing   Problem: Clinical Measurements: Goal: Ability to maintain clinical measurements within normal limits will improve Outcome: Progressing Goal: Will remain free from infection Outcome: Progressing Goal: Diagnostic test results will improve Outcome: Progressing Goal: Respiratory complications will improve Outcome: Progressing Goal: Cardiovascular complication will be avoided Outcome: Progressing   Problem: Activity: Goal: Risk for activity intolerance will decrease Outcome: Progressing   Problem: Nutrition: Goal: Adequate nutrition will be maintained Outcome: Progressing   Problem: Coping: Goal: Level of anxiety will decrease Outcome: Progressing   Problem: Elimination: Goal: Will not experience complications related to bowel motility Outcome: Progressing Goal: Will not experience complications related to urinary retention Outcome: Progressing   Problem: Pain Managment: Goal: General experience of comfort will improve and/or be controlled Outcome: Progressing   Problem: Safety: Goal: Ability to remain free from injury will improve Outcome: Progressing   Problem: Skin Integrity: Goal: Risk for impaired skin integrity will decrease Outcome: Progressing   Problem: Safety: Goal: Non-violent Restraint(s) Outcome: Progressing   Problem: Education: Goal: Ability to describe self-care measures that may prevent or decrease complications (Diabetes Survival Skills Education) will improve Outcome: Progressing Goal: Individualized Educational Video(s) Outcome: Progressing   Problem: Coping: Goal: Ability to adjust to condition or change  in health will improve Outcome: Progressing   Problem: Fluid Volume: Goal: Ability to maintain a balanced intake and output will improve Outcome: Progressing   Problem: Health Behavior/Discharge Planning: Goal: Ability to identify and utilize available resources and services will improve Outcome: Progressing Goal: Ability to manage health-related needs will improve Outcome: Progressing   Problem: Metabolic: Goal: Ability to maintain appropriate glucose levels will improve Outcome: Progressing   Problem: Nutritional: Goal: Maintenance of adequate nutrition will improve Outcome: Progressing Goal: Progress toward achieving an optimal weight will improve Outcome: Progressing   Problem: Skin Integrity: Goal: Risk for impaired skin integrity will decrease Outcome: Progressing   Problem: Tissue Perfusion: Goal: Adequacy of tissue perfusion will improve Outcome: Progressing

## 2024-06-20 NOTE — Plan of Care (Signed)
 Problem: Education: Goal: Knowledge of General Education information will improve Description: Including pain rating scale, medication(s)/side effects and non-pharmacologic comfort measures 06/20/2024 1929 by Norville President, RN Outcome: Progressing 06/20/2024 1929 by Norville President, RN Outcome: Progressing 06/20/2024 1928 by Norville President, RN Outcome: Progressing   Problem: Health Behavior/Discharge Planning: Goal: Ability to manage health-related needs will improve 06/20/2024 1929 by Norville President, RN Outcome: Progressing 06/20/2024 1929 by Norville President, RN Outcome: Progressing 06/20/2024 1928 by Norville President, RN Outcome: Progressing   Problem: Clinical Measurements: Goal: Ability to maintain clinical measurements within normal limits will improve 06/20/2024 1929 by Norville President, RN Outcome: Progressing 06/20/2024 1929 by Norville President, RN Outcome: Progressing 06/20/2024 1928 by Norville President, RN Outcome: Progressing Goal: Will remain free from infection 06/20/2024 1929 by Norville President, RN Outcome: Progressing 06/20/2024 1929 by Norville President, RN Outcome: Progressing 06/20/2024 1928 by Norville President, RN Outcome: Progressing Goal: Diagnostic test results will improve 06/20/2024 1929 by Norville President, RN Outcome: Progressing 06/20/2024 1929 by Norville President, RN Outcome: Progressing 06/20/2024 1928 by Norville President, RN Outcome: Progressing Goal: Respiratory complications will improve 06/20/2024 1929 by Norville President, RN Outcome: Progressing 06/20/2024 1929 by Norville President, RN Outcome: Progressing 06/20/2024 1928 by Norville President, RN Outcome: Progressing Goal: Cardiovascular complication will be avoided 06/20/2024 1929 by Norville President, RN Outcome: Progressing 06/20/2024 1929 by Norville President, RN Outcome: Progressing 06/20/2024 1928 by Norville President, RN Outcome: Progressing   Problem: Activity: Goal: Risk for activity intolerance will decrease 06/20/2024  1929 by Norville President, RN Outcome: Progressing 06/20/2024 1929 by Norville President, RN Outcome: Progressing 06/20/2024 1928 by Norville President, RN Outcome: Progressing   Problem: Nutrition: Goal: Adequate nutrition will be maintained 06/20/2024 1929 by Norville President, RN Outcome: Progressing 06/20/2024 1929 by Norville President, RN Outcome: Progressing 06/20/2024 1928 by Norville President, RN Outcome: Progressing   Problem: Coping: Goal: Level of anxiety will decrease 06/20/2024 1929 by Norville President, RN Outcome: Progressing 06/20/2024 1929 by Norville President, RN Outcome: Progressing 06/20/2024 1928 by Norville President, RN Outcome: Progressing   Problem: Elimination: Goal: Will not experience complications related to bowel motility 06/20/2024 1929 by Norville President, RN Outcome: Progressing 06/20/2024 1929 by Norville President, RN Outcome: Progressing 06/20/2024 1928 by Norville President, RN Outcome: Progressing Goal: Will not experience complications related to urinary retention 06/20/2024 1929 by Norville President, RN Outcome: Progressing 06/20/2024 1929 by Norville President, RN Outcome: Progressing 06/20/2024 1928 by Norville President, RN Outcome: Progressing   Problem: Pain Managment: Goal: General experience of comfort will improve and/or be controlled 06/20/2024 1929 by Norville President, RN Outcome: Progressing 06/20/2024 1929 by Norville President, RN Outcome: Progressing 06/20/2024 1928 by Norville President, RN Outcome: Progressing   Problem: Safety: Goal: Ability to remain free from injury will improve 06/20/2024 1929 by Norville President, RN Outcome: Progressing 06/20/2024 1929 by Norville President, RN Outcome: Progressing 06/20/2024 1928 by Norville President, RN Outcome: Progressing   Problem: Skin Integrity: Goal: Risk for impaired skin integrity will decrease 06/20/2024 1929 by Norville President, RN Outcome: Progressing 06/20/2024 1929 by Norville President, RN Outcome: Progressing 06/20/2024 1928 by Norville President, RN Outcome: Progressing   Problem: Safety: Goal: Non-violent Restraint(s) 06/20/2024 1929 by Norville President, RN Outcome: Progressing 06/20/2024 1929 by Norville President, RN Outcome: Progressing 06/20/2024 1928 by Norville President, RN Outcome: Progressing   Problem: Education: Goal: Ability to describe self-care measures that may prevent or decrease complications (Diabetes Survival Skills Education) will improve 06/20/2024 1929 by Norville President, RN Outcome: Progressing 06/20/2024 1929 by Norville President, RN Outcome: Progressing 06/20/2024  1928 by Norville President, RN Outcome: Progressing Goal: Individualized Educational Video(s) 06/20/2024 1929 by Norville President, RN Outcome: Progressing 06/20/2024 1929 by Norville President, RN Outcome: Progressing 06/20/2024 1928 by Norville President, RN Outcome: Progressing   Problem: Coping: Goal: Ability to adjust to condition or change in health will improve 06/20/2024 1929 by Norville President, RN Outcome: Progressing 06/20/2024 1929 by Norville President, RN Outcome: Progressing 06/20/2024 1928 by Norville President, RN Outcome: Progressing   Problem: Fluid Volume: Goal: Ability to maintain a balanced intake and output will improve 06/20/2024 1929 by Norville President, RN Outcome: Progressing 06/20/2024 1929 by Norville President, RN Outcome: Progressing 06/20/2024 1928 by Norville President, RN Outcome: Progressing   Problem: Health Behavior/Discharge Planning: Goal: Ability to identify and utilize available resources and services will improve 06/20/2024 1929 by Norville President, RN Outcome: Progressing 06/20/2024 1929 by Norville President, RN Outcome: Progressing 06/20/2024 1928 by Norville President, RN Outcome: Progressing Goal: Ability to manage health-related needs will improve 06/20/2024 1929 by Norville President, RN Outcome: Progressing 06/20/2024 1929 by Norville President, RN Outcome: Progressing 06/20/2024 1928 by Norville President, RN Outcome: Progressing   Problem:  Metabolic: Goal: Ability to maintain appropriate glucose levels will improve 06/20/2024 1929 by Norville President, RN Outcome: Progressing 06/20/2024 1929 by Norville President, RN Outcome: Progressing 06/20/2024 1928 by Norville President, RN Outcome: Progressing   Problem: Nutritional: Goal: Maintenance of adequate nutrition will improve 06/20/2024 1929 by Norville President, RN Outcome: Progressing 06/20/2024 1929 by Norville President, RN Outcome: Progressing 06/20/2024 1928 by Norville President, RN Outcome: Progressing Goal: Progress toward achieving an optimal weight will improve 06/20/2024 1929 by Norville President, RN Outcome: Progressing 06/20/2024 1929 by Norville President, RN Outcome: Progressing 06/20/2024 1928 by Norville President, RN Outcome: Progressing   Problem: Skin Integrity: Goal: Risk for impaired skin integrity will decrease 06/20/2024 1929 by Norville President, RN Outcome: Progressing 06/20/2024 1929 by Norville President, RN Outcome: Progressing 06/20/2024 1928 by Norville President, RN Outcome: Progressing   Problem: Tissue Perfusion: Goal: Adequacy of tissue perfusion will improve 06/20/2024 1929 by Norville President, RN Outcome: Progressing 06/20/2024 1929 by Norville President, RN Outcome: Progressing 06/20/2024 1928 by Norville President, RN Outcome: Progressing

## 2024-06-20 NOTE — TOC Progression Note (Addendum)
 Transition of Care [facility]) - Progression Note    Patient Details  Name: Martin Ford MRN: [id number] Date of Birth: 1936/07/29  [facility]) CM/SW Contact  Luise JAYSON Pan, [place] Phone Number: 06/20/2024, 1:36 PM  Clinical Narrative:   Per RN, patient is doing a voiding trial. CSW awaiting bedside RN to notify CSW when to arrange transport.  4:12 PM Per [facility] at [facility], they prefer patient to arrive to facility no later than 8PM or to discharge tomorrow morning and patients prescriptions will not be filled until patient is at the facility in the bed. CSW notified bedside RN.   CSW will continue to follow.    Expected Discharge Plan: Skilled Nursing Facility Barriers to Discharge: Barriers Resolved               Expected Discharge Plan and Services In-house Referral: Clinical Social Work   Post Acute Care Choice: Skilled Nursing Facility Living arrangements for the past 2 months: Single Family Home Expected Discharge Date: 06/20/24                                     Social Drivers of Health (SDOH) Interventions SDOH Screenings   Food Insecurity: No Food Insecurity (06/01/2022)  Housing: Low Risk  (06/06/2024)  Transportation Needs: No Transportation Needs (06/06/2024)  Utilities: Patient Unable To Answer (06/03/2024)  Alcohol Screen: Low Risk  (06/01/2022)  Depression (PHQ2-9): Low Risk  (02/21/2024)  Financial Resource Strain: Low Risk  (06/01/2022)  Physical Activity: Inactive (06/01/2022)  Social Connections: Unknown (06/01/2022)  Stress: No Stress Concern Present (06/01/2022)  Tobacco Use: Medium Risk (06/02/2024)    Readmission Risk Interventions     No data to display

## 2024-06-20 NOTE — Progress Notes (Signed)
 Foley removed at approximately 1100 per orders from Dr. Paudel for voiding trial. At 1600, patient had not voided and bladder scan showed 118 mL. At 1800, patient had not voided and bladder scan showed 208 mL. Per Dr. Roann, we will hold discharge until tomorrow, at which time we will have more information on if patient can void on his own, or if he will need Foley catheter to be replaced.

## 2024-06-20 NOTE — TOC Transition Note (Signed)
 Transition of Care [facility]) - Discharge Note   Patient Details  Name: Martin Ford MRN: [id number] Date of Birth: October 25, 1935  Transition [facility] Care [facility]) CM/SW Contact:  Luise JAYSON Pan, LCSWA Phone Number: 06/20/2024, 4:22 PM   Clinical Narrative:   Patient will DC to: [facility]F Anticipated DC date: 06/20/24  Family notified: Teretha (Spouse)  Transport by: [place]   Per MD patient ready for DC to [facility]. RN to call report prior to discharge [phone]; room A1-2). RN, patient, patient's family, and facility notified of DC. Discharge Summary and FL2 sent to facility. DC packet on chart. Ambulance transport requested for patient 4:27 PM. CSW provided bedside RN number to call transport back if need to cancel [phone]: click option 3 twice).   CSW will sign off for now as social work intervention is no longer needed. Please consult us  again if new needs arise.  Final next level of care: Skilled Nursing Facility Barriers to Discharge: Barriers Resolved   Patient Goals and CMS Choice Patient states their goals for this hospitalization and ongoing recovery are:: Rehab CMS Medicare.gov Compare Post Acute Care list provided to:: Patient Represenative (must comment) Choice offered to / list presented to : Spouse Bryan ownership interest in [facility].provided to:: Spouse    Discharge Placement              Patient chooses bed at: Other - please specify in the comment section below: [facility]) Patient to be transferred to facility by: [facility] Name of family member notified: Teretha (Spouse) Patient and family notified of of transfer: 06/20/24  Discharge Plan and Services Additional resources added to the After Visit Summary for   In-house Referral: Clinical Social Work   Post Acute Care Choice: Skilled Nursing Facility                               Social Drivers of Health (SDOH) Interventions SDOH Screenings   Food Insecurity:  No Food Insecurity (06/01/2022)  Housing: Low Risk  (06/06/2024)  Transportation Needs: No Transportation Needs (06/06/2024)  Utilities: Patient Unable To Answer (06/03/2024)  Alcohol Screen: Low Risk  (06/01/2022)  Depression (PHQ2-9): Low Risk  (02/21/2024)  Financial Resource Strain: Low Risk  (06/01/2022)  Physical Activity: Inactive (06/01/2022)  Social Connections: Unknown (06/01/2022)  Stress: No Stress Concern Present (06/01/2022)  Tobacco Use: Medium Risk (06/02/2024)     Readmission Risk Interventions     No data to display

## 2024-06-20 NOTE — Discharge Summary (Signed)
 Physician Discharge Summary   Patient: Martin Ford MRN: [id number] DOB: 1935-10-01  Admit date:     06/01/2024  Discharge date: 06/20/24  Discharge Physician: Nena Rebel   PCP: Tobie Suzzane POUR, MD   Recommendations at discharge:   Continue taking albuterol  nebulization, budesonide nebulization Continue care and transition to hospice/palliative from skilled nursing facility  Discharge Diagnoses: Principal Problem:   COPD (chronic obstructive pulmonary disease) (HCC) Active Problems:   Acute and chronic respiratory failure (acute-on-chronic) (HCC)   Malnutrition of moderate degree  Resolved Problems:   * No resolved hospital problems. [facility]: 88 y.o. male  with past medical history of COPD, chronic respiratory failure on home O2, aortic stenosis, and GERD who presented to the ED on 06/01/2024 with complaint of shortness of breath and confusion.  He was admitted with acute on chronic hypoxic/hypercapnic respiratory failure secondary to AECOPD. He required intubation 9/29 and was extubated 10/3.  Hospitalization has been complicated by AKI, hypernatremia, dysphagia, malnutrition, and delirium. He has remained NPO due to decreased alertness and had cortrak placed on 10/6.  Palliative care team evaluated the patient.  Patient mentation improved and he was able to eat when evaluated by speech pathology.  Cortrak was removed.  He has been able to eat and drink since then.  His Foley catheter has been removed and voiding trial has been done. Physical and Occupational Therapy advised for rehab placement.  He will be discharged to skilled nursing facility from where he will be transition to hospice/palliative care.  He will be discharged to skilled nursing facility today 10/18.   Assessment and Plan:  Acute on chronic hypoxic/hypercapnic respiratory failure 2/2 AECOPD - COPD Aspiration pneumonia. Required intubation on 9/29. Uses 2 LPM chronically at home. Currently on 2  LPM. Was on BiPAP after extubation. Patient appears to be having very poor cough. Chest PT was ordered.  Currently discontinued by respiratory therapist. Difficulty use BiPAP in the setting of Cortrak. S/P IV Unasyn. S/P Solu-Medrol, s/p prednisone   and continue nebulizer therapy.   AKI: resolved Creatinine went to 2.24. Post diuresis.back to baseline at 0.84 Renal function improving.  normal values.   Hypernatremia. Resolved  Mitral valve prolapse. Moderate aortic stenosis. Moderate aortic regurgitation. ICU discussed with cardiologist Dr  Ren Maha.  Recommended to follow as an outpatient This renders very poor prognosis for the patient's current presentation though.   Hyperglycemia/hypoglycemia: Resolved Hyperglycemia presumed 2/2 steroids.  Later on become hypoglycemic as steroids are weaned and tube feeds are stopped. Treated with D10. S/p Cortrak removed.   Dysphagia. Poor p.o. intake. Secondary to patient's ongoing delirium patient is not a good candidate for swallow. SLP has been following but patient has not been able to pass swallow evaluation. Suspect prognosis is poor. SLP evaluation done 10/16, advised for mechanical soft diet, Cortrak removed, tolerating diet.   Moderate protein calorie malnutrition Able to eat and meeting nutritional needs at this point.   Acute bilateral distal DVT. CTPE negative for PE. Lower extremity Doppler on 10/3 positive for bilateral distal DVT Continue Eliquis   Diarrhea: Resolved  Delirium. Patient is somewhat confused and agitated. Required Precedex in the ICU. Resolved  Continue Seroquel.   Goals of care conversation. Discussed with wife at bedside. Patient has a living will that states that if he is not evaluated to state he would like to be resuscitated. But in a vegetative state he would not like to be kept alive artificially in machine. Palliative care consulted.  Patient's prognosis  appears to be  poor. Monitor.   Moderate protein calorie malnutrition Body mass index is 19.34 kg/m.  Continue supplement. Poor prognosis for the patient.   Hypertension. Blood pressure elevated. As needed hydralazine.  May need to add medication at nursing home      Pain control - [place]  Controlled Substance Reporting System database was reviewed. and patient was instructed, not to drive, operate heavy machinery, perform activities at heights, swimming or participation in water activities or provide baby-sitting services while on Pain, Sleep and Anxiety Medications; until their outpatient Physician has advised to do so again. Also recommended to not to take more than prescribed Pain, Sleep and Anxiety Medications.  Consultants: Palliative care Procedures performed: None  Disposition: Skilled nursing facility Diet recommendation:  Regular diet DISCHARGE MEDICATION: Allergies as of 06/20/2024   No Known Allergies      Medication List     STOP taking these medications    [employer] 500-32.5 MG Tabs Generic drug: Aspirin-Caffeine   doxycycline  100 MG tablet Commonly known as: VIBRA -TABS   predniSONE  10 MG tablet Commonly known as: DELTASONE        TAKE these medications    acetaminophen 325 MG tablet Commonly known as: TYLENOL Take 2 tablets (650 mg total) by mouth every 6 (six) hours as needed for mild pain (pain score 1-3), moderate pain (pain score 4-6), fever or headache.   albuterol  108 (90 Base) MCG/ACT inhaler Commonly known as: VENTOLIN  HFA USE 2 INHALATIONS BY MOUTH EVERY 6 HOURS AS NEEDED FOR WHEEZING  OR SHORTNESS OF BREATH   apixaban 5 MG Tabs tablet Commonly known as: ELIQUIS Take 1 tablet (5 mg total) by mouth 2 (two) times daily.   budesonide 0.25 MG/2ML nebulizer solution Commonly known as: PULMICORT Take 2 mLs (0.25 mg total) by nebulization 2 (two) times daily.   ipratropium-albuterol  0.5-2.5 (3) MG/3ML Soln Commonly known as: DUONEB Take  3 mLs by nebulization every 6 (six) hours as needed.   mouth rinse Liqd solution 15 mLs by Mouth Rinse route as needed (oral care).   pantoprazole  40 MG tablet Commonly known as: Protonix  Take 1 tablet (40 mg total) by mouth daily. Take 30-60 min before first meal of the day What changed:  when to take this reasons to take this additional instructions   QUEtiapine 50 MG tablet Commonly known as: SEROQUEL Take 1 tablet (50 mg total) by mouth at bedtime.        Contact information for after-discharge care     Destination     [facility] .   Service: Skilled Nursing Contact information: [address] [place]  [address] [phone]                    Discharge Exam: Filed Weights   06/18/24 0435 06/19/24 0500 06/20/24 0500  Weight: 66.9 kg 66.1 kg 67 kg    Examined at bedside. Lungs bilateral decreased air entry at the bases with bilateral coarse crackles occasional scattered wheezing present Heart regular sinus Abdomen soft bowel supple Extremities no edema skin Skin no rashes  Condition at discharge: fair  The results of significant diagnostics from this hospitalization (including imaging, microbiology, ancillary and laboratory) are listed below for reference.   Imaging Studies: DG ESOPHAGUS W SINGLE CM (SOL OR THIN BA) Result Date: 06/19/2024 CLINICAL DATA:  Pneumonia and dysphagia with poor PO intake. Suspected aspiration. EXAM: ESOPHAGUS/BARIUM SWALLOW/TABLET STUDY TECHNIQUE: Single contrast examination was performed using thin  liquid barium. Exam significantly limited secondary to patient immobility, unable to stand or roll over on fluoro table, as well as limited p.o. intake. This exam was performed by Kimble Clas, PA-C, and was supervised and interpreted by Dr. Norleen Kil. FLUOROSCOPY: Radiation Exposure Index (as provided by the fluoroscopic device): 42.00 mGy Kerma COMPARISON:  None  Available. FINDINGS: Swallowing: Limited assessment due to patient inability to be placed in erect lateral position. No gross abnormality seen on limited oblique projections. Pharynx: Limited assessment, as noted above.  No gross abnormality. Esophagus: Suboptimal assessment due to patient's inability to forearm rapid bolus swallowing. No No evidence of esophageal obstruction, mass, or definite stricture. Esophageal motility: Mild to moderate esophageal dysmotility. Hiatal Hernia: None. Gastroesophageal reflux: Mild reflux to the level of the distal esophagus. Ingested 13 mm barium tablet: Became stuck in the midthoracic esophagus despite multiple sips of water and thin barium. Observed for greater than 3 minutes. No evidence of focal narrowing or stricture. This is likely due to combination of dysmotility and patient's inability to be placed in erect position. Other: None. IMPRESSION: Limited exam 1. Technically limited exam, as discussed above. 2. Mild-to-moderate esophageal dysmotility. 3. Mild gastroesophageal reflux, without evidence of hiatal hernia or esophageal stricture. 4. 13 mm barium tablet became stuck in the midthoracic esophagus. No evidence of focal narrowing or stricture. This is likely due to combination of esophageal dysmotility in patient's inability to be placed in erect position. Electronically Signed   By: Norleen DELENA Kil M.D.   On: 06/19/2024 18:21   DG Swallowing Func-Speech Pathology Result Date: 06/12/2024 Table formatting from the original result was not included. Modified Barium Swallow Study Patient Details Name: Rio Kidane MRN: [id number] Date of Birth: 16-May-1936 Today's Date: 06/12/2024 HPI/PMH: HPI: Pt is a 88 y.o. male who presented with worsening dyspnea despite increase in O2 from baseline 2L to 4L. He was taken to [facility] ED where he required intubation. CTA chest demonstrated Bronchiectasis, no PE. CT head demonstrated no acute issues, generalized atrophy and sinus thickening. ETT  9/29- 10/3 (5 days). Failed [facility] Swallow Screen 10/4. PMH: COPD Stage 3 by GOLD (PFT's Dec 2022 with FEV1 0.95 (38%), ratio 0.48) and chronically on 2L O2. Clinical Impression: Clinical Impression: Pt has a mild pharyngeal dysphagia (DIGEST score of 1) with possible esophageal component as well. He became more short of breath as trials continued, but only exhibited difficulty coordinating breathing and swallowing x1 when drinking thin liquids via straw. Aspiration was sensed, but not able to visualize if it cleared (PAS 7). On other trials, pt achieved better timing. He had some reduced anterior hyoid excursion UES opening but this only had a functional impact on swallowing the barium tablet, although question if presence of Cortrak may have also contributed. The tablet was initially stuck in the valleculae, requiring both additional purees and a chin tuck to clear it. It was then delayed in the proximal esophagus, and then the distal esophagus, not observed to fully exit the esophagus. Recommend initiating Dys 3 (mechanical soft) diet and thin liquids by cup, as well as meds crushed in puree. DIGEST Swallow Severity Rating*  Safety: 1  Efficiency: 0  Overall Pharyngeal Swallow Severity: 1: mild; 2: moderate; 3: severe; 4: profound *The Dynamic Imaging Grade of Swallowing Toxicity is standardized for the head and neck cancer population, however, demonstrates promising clinical applications across populations to standardize the clinical rating of pharyngeal swallow safety and severity. Factors that may increase risk of adverse event in presence of aspiration (  Palmer & Lianne 2021): Factors that may increase risk of adverse event in presence of aspiration Noe & Lianne 2021): Respiratory or GI disease; Frail or deconditioned Recommendations/Plan: Swallowing Evaluation Recommendations Swallowing Evaluation Recommendations Recommendations: PO diet PO Diet Recommendation: Dysphagia 3 (Mechanical soft); Thin liquids  (Level 0) Liquid Administration via: Cup; No straw Medication Administration: Crushed with puree Supervision: Staff to assist with self-feeding; Full supervision/cueing for swallowing strategies Swallowing strategies  : Minimize environmental distractions; Slow rate; Small bites/sips; Follow solids with liquids (take breaks for breathing PRN) Postural changes: Position pt fully upright for meals; Stay upright 30-60 min after meals Oral care recommendations: Oral care BID (2x/day) Treatment Plan Treatment Plan Treatment recommendations: Therapy as outlined in treatment plan below Follow-up recommendations: Skilled nursing-short term rehab (<3 hours/day) Functional status assessment: Patient has had a recent decline in their functional status and demonstrates the ability to make significant improvements in function in a reasonable and predictable amount of time. Treatment frequency: Min 2x/week Treatment duration: 2 weeks Interventions: Aspiration precaution training; Compensatory techniques; Patient/family education; Trials of upgraded texture/liquids; Diet toleration management by SLP Recommendations Recommendations for follow up therapy are one component of a multi-disciplinary discharge planning process, led by the attending physician.  Recommendations may be updated based on patient status, additional functional criteria and insurance authorization. Assessment: Orofacial Exam: Orofacial Exam Oral Cavity - Dentition: Missing dentition Anatomy: Anatomy: WFL Boluses Administered: Boluses Administered Boluses Administered: Thin liquids (Level 0); Mildly thick liquids (Level 2, nectar thick); Puree; Solid  Oral Impairment Domain: Oral Impairment Domain Lip Closure: No labial escape Tongue control during bolus hold: Escape to lateral buccal cavity/floor of mouth Bolus preparation/mastication: Timely and efficient chewing and mashing Bolus transport/lingual motion: Brisk tongue motion Oral residue: Complete oral  clearance Location of oral residue : N/A Initiation of pharyngeal swallow : Valleculae  Pharyngeal Impairment Domain: Pharyngeal Impairment Domain Soft palate elevation: No bolus between soft palate (SP)/pharyngeal wall (PW) Laryngeal elevation: Complete superior movement of thyroid  cartilage with complete approximation of arytenoids to epiglottic petiole Anterior hyoid excursion: Partial anterior movement Epiglottic movement: Complete inversion Laryngeal vestibule closure: Complete, no air/contrast in laryngeal vestibule Pharyngeal stripping wave : Present - complete Pharyngeal contraction (A/P view only): N/A Pharyngoesophageal segment opening: Partial distention/partial duration, partial obstruction of flow Tongue base retraction: No contrast between tongue base and posterior pharyngeal wall (PPW) Pharyngeal residue: Complete pharyngeal clearance Location of pharyngeal residue: N/A  Esophageal Impairment Domain: Esophageal Impairment Domain Esophageal clearance upright position: Esophageal retention Pill: Pill Consistency administered: Thin liquids (Level 0) Thin liquids (Level 0): Impaired (see clinical impressions) Penetration/Aspiration Scale Score: Penetration/Aspiration Scale Score 1.  Material does not enter airway: Mildly thick liquids (Level 2, nectar thick); Moderately thick liquids (Level 3, honey thick); Puree; Solid; Pill 7.  Material enters airway, passes BELOW cords and not ejected out despite cough attempt by patient: Thin liquids (Level 0) Compensatory Strategies: No data recorded  General Information: Caregiver present: No  Diet Prior to this Study: NPO   Temperature : Normal   Respiratory Status: Tachypneic   Supplemental O2: Nasal cannula   History of Recent Intubation: Yes  Behavior/Cognition: Alert; Cooperative; Pleasant mood; Requires cueing No data recorded Baseline vocal quality/speech: Normal Volitional Cough: Able to elicit Volitional Swallow: Able to elicit Exam Limitations: No  limitations Goal Planning: Prognosis for improved oropharyngeal function: Good No data recorded No data recorded Patient/Family Stated Goal: none stated Consulted and agree with results and recommendations: Patient Pain: Pain Assessment Pain Assessment: Faces Faces Pain Scale: 0  Pain Intervention(s): Monitored during session End of Session: Start Time:SLP Start Time (ACUTE ONLY): 1049 Stop Time: SLP Stop Time (ACUTE ONLY): 1103 Time Calculation:SLP Time Calculation (min) (ACUTE ONLY): 14 min Charges: SLP Evaluations $ SLP Speech Visit: 1 Visit SLP Evaluations $MBS Swallow: 1 Procedure $Swallowing Treatment: 1 Procedure SLP visit diagnosis: SLP Visit Diagnosis: Dysphagia, pharyngoesophageal phase (R13.14) Past Medical History: Past Medical History: Diagnosis Date  Emphysema of lung (HCC)   Essential tremor   Hypertension  Past Surgical History: No past surgical history on file. Leita SAILOR., M.A. CCC-SLP Acute Rehabilitation Services Office: [phone] Secure chat preferred 06/12/2024, 1:05 PM  DG CHEST PORT 1 VIEW Result Date: 06/09/2024 CLINICAL DATA:  Shortness of breath. EXAM: PORTABLE CHEST 1 VIEW COMPARISON:  06/07/2024 FINDINGS: Stable normal-sized heart and mildly tortuous and calcified thoracic aorta. Decreased prominence of the interstitial markings in the left mid and lower lung zones. Mildly increased patchy density in the left lower lobe and mildly improved patchy density at the right lung base. Bilateral shoulder degenerative changes, marked on the left. IMPRESSION: 1. Mildly increased probable pneumonia in the left lower lobe and mildly improved mild probable pneumonia at the right lung base. 2. Decreased prominence of the interstitial markings in the left mid and lower lung zones compatible with improving pneumonitis. Electronically Signed   By: Elspeth Bathe M.D.   On: 06/09/2024 14:58   DG Abd Portable 1V Result Date: 06/08/2024 CLINICAL DATA:  [id number] Abdominal pain [id number] EXAM: PORTABLE ABDOMEN  - 1 VIEW COMPARISON:  06/01/2024 FINDINGS: Nonobstructive bowel gas pattern.Interval removal of the esophagogastric tube.No pneumoperitoneum. No organomegaly or radiopaque calculi. Multilevel degenerative disc disease of the spine. Patchy airspace opacities in both lung bases again noted, more so on the left than the right. IMPRESSION: Nonobstructive bowel gas pattern. Electronically Signed   By: Rogelia Myers M.D.   On: 06/08/2024 14:16   VAS US  LOWER EXTREMITY VENOUS (DVT) Result Date: 06/08/2024  Lower Venous DVT Study Patient Name:  OSWIN GRIFFITH  Date of Exam:   06/05/2024 Medical Rec #: [id number]       Accession #:    [id number] Date of Birth: 06-25-1936        Patient Gender: M Patient Age:   88 years Exam Location:  [facility] Procedure:      VAS US  LOWER EXTREMITY VENOUS (DVT) Referring Phys: SAMMI PAWAR --------------------------------------------------------------------------------  Indications: Swelling right > left.  Limitations: Patient coming off sedation and agitated, moving legs away and pushing probe with mittened hands. Acoustic shadowing from arterial calcification. Comparison Study: No prior LEV on file. Patient had ABI done 09/05/22 indicating                   severely reduced bilateral ABIs--R:0.45 L:0.43 Performing Technologist: Alberta Lis RVS  Examination Guidelines: A complete evaluation includes B-mode imaging, spectral Doppler, color Doppler, and power Doppler as needed of all accessible portions of each vessel. Bilateral testing is considered an integral part of a complete examination. Limited examinations for reoccurring indications may be performed as noted. The reflux portion of the exam is performed with the patient in reverse Trendelenburg.  +---------+---------------+---------+-----------+----------+-----------------+ RIGHT    CompressibilityPhasicitySpontaneityPropertiesThrombus Aging     +---------+---------------+---------+-----------+----------+-----------------+ CFV      Full           Yes      No                                     +---------+---------------+---------+-----------+----------+-----------------+  SFJ      Full                                                           +---------+---------------+---------+-----------+----------+-----------------+ FV Prox  Full           Yes      No                                     +---------+---------------+---------+-----------+----------+-----------------+ FV Mid   Full                                                           +---------+---------------+---------+-----------+----------+-----------------+ FV DistalFull                                                           +---------+---------------+---------+-----------+----------+-----------------+ PFV      Full                                                           +---------+---------------+---------+-----------+----------+-----------------+ POP      Partial        No       No                   Acute             +---------+---------------+---------+-----------+----------+-----------------+ PTV      None                                         Age Indeterminate +---------+---------------+---------+-----------+----------+-----------------+ PERO     None                                         Age Indeterminate +---------+---------------+---------+-----------+----------+-----------------+ Incidentally, the superficial femoral and posterior tibial arteries are occluded. The distal Anterior tibial artery flow is significantly dampened.  +---------+---------------+---------+-----------+---------------+--------------+ LEFT     CompressibilityPhasicitySpontaneityProperties     Thrombus Aging +---------+---------------+---------+-----------+---------------+--------------+ CFV      Full           Yes      No          pulsatile  waveforms                     +---------+---------------+---------+-----------+---------------+--------------+ SFJ      Full                                                             +---------+---------------+---------+-----------+---------------+--------------+ FV Prox  Full           Yes      No         pulsatile                                                                 waveforms                     +---------+---------------+---------+-----------+---------------+--------------+ FV Mid   Full           Yes      No         pulsatile                                                                 waveforms                     +---------+---------------+---------+-----------+---------------+--------------+ FV DistalFull           Yes      No         pulsatile                                                                 waveforms                     +---------+---------------+---------+-----------+---------------+--------------+ PFV      Full           Yes      No         pulsatile                                                                 waveforms                     +---------+---------------+---------+-----------+---------------+--------------+ POP      None           No       No  Age                                                                       Indeterminate  +---------+---------------+---------+-----------+---------------+--------------+ PTV      None                                              Age                                                                       Indeterminate  +---------+---------------+---------+-----------+---------------+--------------+ PERO     None                                              Age                                                                        Indeterminate  +---------+---------------+---------+-----------+---------------+--------------+ Incidentally, the posterior tibial artery is occluded. The distal anterior tibial artery is significantly dampened into the foot    Summary: RIGHT: - Findings consistent with acute deep vein thrombosis involving the right popliteal vein.  - Findings consistent with age indeterminate deep vein thrombosis involving the right posterior tibial veins, and right peroneal veins.  - No cystic structure found in the popliteal fossa. - Incidentally, the superficial femoral and posterior tibial arteries are occluded. The distal Anterior tibial artery flow is significantly dampened.  LEFT: - Findings consistent with age indeterminate deep vein thrombosis involving the left popliteal vein, left posterior tibial veins, and left peroneal veins.  - No cystic structure found in the popliteal fossa. - Incidentally, the posterior tibial artery is occluded. The distal anterior tibial artery is significantly dampened into the foot  *See table(s) above for measurements and observations. Electronically signed by Gaile New MD on 06/08/2024 at 8:15:46 AM.    Final    DG CHEST PORT 1 VIEW Result Date: 06/07/2024 CLINICAL DATA:  Shortness of breath. EXAM: PORTABLE CHEST 1 VIEW COMPARISON:  Radiograph 06/03/2024 FINDINGS: Interval extubation and removal of enteric tube. The lungs are hyperinflated with bronchial thickening. Development of patchy and reticular airspace disease in both lung bases and left midlung zone. Question small left pleural effusion. No pneumothorax. Stable heart size and mediastinal contours. IMPRESSION: 1. Interval extubation and removal of enteric tube. 2. Development of patchy and reticular airspace disease in both lung bases and left midlung zone, suspicious for pneumonia. 3. Question small left pleural effusion. Electronically Signed   By: Andrea Marlee HERO.D.  On: 06/07/2024 19:27   ECHOCARDIOGRAM  COMPLETE Result Date: 06/04/2024    ECHOCARDIOGRAM REPORT   Patient Name:   DETAVIOUS RINN Date of Exam: 06/04/2024 Medical Rec #:  [id number]      Height:       71.0 in Accession #:    [id number]     Weight:       154.8 lb Date of Birth:  Jun 07, 1936       BSA:          1.891 m Patient Age:    88 years       BP:           118/52 mmHg Patient Gender: M              HR:           64 bpm. Exam Location:  Inpatient Procedure: 2D Echo (Both Spectral and Color Flow Doppler were utilized during            procedure). Indications:    Acute respiratory distress  History:        Patient has prior history of Echocardiogram examinations.  Sonographer:    Charmaine Gaskins Referring Phys: [id number] RAHUL PAWAR  Sonographer Comments: Echo performed with patient supine and on artificial respirator. IMPRESSIONS  1. Left ventricular ejection fraction, by estimation, is 55 to 60%. Left ventricular ejection fraction by 2D MOD biplane is 58.0 %. The left ventricle has normal function. The left ventricle has no regional wall motion abnormalities. Left ventricular diastolic parameters are consistent with Grade I diastolic dysfunction (impaired relaxation).  2. Right ventricular systolic function is normal. The right ventricular size is normal.  3. There is a small mobile mass noted prolapsing into the left atrium, associated with the AMVL, which may be a ruptured cord. The mitral valve is degenerative. Mild mitral valve regurgitation.  4. The tricuspid valve is abnormal. Tricuspid valve regurgitation is moderate.  5. The aortic valve is tricuspid. There is moderate calcification of the aortic valve. Aortic valve regurgitation is mild to moderate. Moderate aortic valve stenosis. Aortic valve area, by VTI measures 1.31 cm. Aortic valve mean gradient measures 21.0 mmHg. Aortic valve Vmax measures 3.02 m/s. Peak gradient 36.4 mmHg, DI 0.40. Comparison(s): Changes from prior study are noted. 01/02/2023: LVEF 60-65%, moderate AS, mild AI - MG 21 mmHg.  FINDINGS  Left Ventricle: Left ventricular ejection fraction, by estimation, is 55 to 60%. Left ventricular ejection fraction by 2D MOD biplane is 58.0 %. The left ventricle has normal function. The left ventricle has no regional wall motion abnormalities. The left ventricular internal cavity size was normal in size. There is no left ventricular hypertrophy. Left ventricular diastolic parameters are consistent with Grade I diastolic dysfunction (impaired relaxation). Indeterminate filling pressures. Right Ventricle: The right ventricular size is normal. No increase in right ventricular wall thickness. Right ventricular systolic function is normal. Left Atrium: Left atrial size was normal in size. Right Atrium: Right atrial size was normal in size. Pericardium: There is no evidence of pericardial effusion. Mitral Valve: There is a small mobile mass noted prolapsing into the left atrium, associated with the AMVL, which may be a ruptured cord. The mitral valve is degenerative in appearance. Mild mitral valve regurgitation. MV peak gradient, 4.3 mmHg. The mean mitral valve gradient is 2.0 mmHg. Tricuspid Valve: The tricuspid valve is abnormal. Tricuspid valve regurgitation is moderate. Aortic Valve: The aortic valve is tricuspid. There is moderate calcification of the aortic valve. Aortic valve regurgitation is  mild to moderate. Moderate aortic stenosis is present. Aortic valve mean gradient measures 21.0 mmHg. Aortic valve peak gradient measures 36.4 mmHg. Aortic valve area, by VTI measures 1.31 cm. Pulmonic Valve: The pulmonic valve was grossly normal. Pulmonic valve regurgitation is trivial. Aorta: The aortic root and ascending aorta are structurally normal, with no evidence of dilitation. Venous: IVC assessment for right atrial pressure unable to be performed due to mechanical ventilation. IAS/Shunts: No atrial level shunt detected by color flow Doppler.  LEFT VENTRICLE PLAX 2D                        Biplane EF  (MOD) LVIDd:         4.74 cm         LV Biplane EF:   Left LVIDs:         3.08 cm                          ventricular LV PW:         0.84 cm                          ejection LV IVS:        0.89 cm                          fraction by LVOT diam:     2.03 cm                          2D MOD LV SV:         79                               biplane is LV SV Index:   42                               58.0 %. LVOT Area:     3.24 cm                                Diastology                                LV e' medial:    5.11 cm/s LV Volumes (MOD)               LV E/e' medial:  18.0 LV vol d, MOD    71.5 ml       LV e' lateral:   7.18 cm/s A2C:                           LV E/e' lateral: 12.8 LV vol d, MOD    76.4 ml A4C: LV vol s, MOD    32.1 ml A2C: LV vol s, MOD    30.3 ml A4C: LV SV MOD A2C:   39.4 ml LV SV MOD A4C:   76.4 ml LV SV MOD BP:    44.4 ml RIGHT VENTRICLE RV Basal diam:  3.36 cm RV Mid diam:    3.14 cm RV S prime:  12.40 cm/s LEFT ATRIUM             Index        RIGHT ATRIUM           Index LA diam:        3.23 cm 1.71 cm/m   RA Area:     18.80 cm LA Vol (A2C):   52.6 ml 27.82 ml/m  RA Volume:   52.80 ml  27.92 ml/m LA Vol (A4C):   47.2 ml 24.96 ml/m LA Biplane Vol: 52.1 ml 27.55 ml/m  AORTIC VALVE AV Area (Vmax):    1.06 cm AV Area (Vmean):   1.01 cm AV Area (VTI):     1.31 cm AV Vmax:           301.50 cm/s AV Vmean:          219.000 cm/s AV VTI:            0.602 m AV Peak Grad:      36.4 mmHg AV Mean Grad:      21.0 mmHg LVOT Vmax:         99.00 cm/s LVOT Vmean:        68.600 cm/s LVOT VTI:          0.243 m LVOT/AV VTI ratio: 0.40  AORTA Ao Root diam: 3.23 cm Ao Asc diam:  3.01 cm MITRAL VALVE               TRICUSPID VALVE MV Area (PHT): 4.15 cm    TR Peak grad:   49.0 mmHg MV Area VTI:   1.96 cm    TR Vmax:        350.00 cm/s MV Peak grad:  4.3 mmHg MV Mean grad:  2.0 mmHg    SHUNTS MV Vmax:       1.04 m/s    Systemic VTI:  0.24 m MV Vmean:      68.3 cm/s   Systemic Diam: 2.03 cm MV Decel  Time: 183 msec MV E velocity: 92.20 cm/s MV A velocity: 90.10 cm/s MV E/A ratio:  1.02 Vinie Maxcy MD Electronically signed by Vinie Maxcy MD Signature Date/Time: 06/04/2024/2:40:39 PM    Final    DG CHEST PORT 1 VIEW Result Date: 06/03/2024 EXAM: 1 VIEW(S) XRAY OF THE CHEST 06/03/2024 03:22:00 PM COMPARISON: 06/01/2024 CLINICAL HISTORY: Hypoxia FINDINGS: LINES, TUBES AND DEVICES: Enteric tube in place with tip and side port below the diaphragm. Endotracheal tube in place with tip 5 cm above the carina. LUNGS AND PLEURA: No focal pulmonary opacity. No pulmonary edema. No pleural effusion. No pneumothorax. HEART AND MEDIASTINUM: No acute abnormality of the cardiac and mediastinal silhouettes. VASCULATURE: Atherosclerotic plaque. BONES AND SOFT TISSUES: No acute osseous abnormality. IMPRESSION: 1. No acute process. Electronically signed by: Donnice Mania MD 06/03/2024 05:34 PM EDT RP Workstation: [id number]   CT Head Wo Contrast Result Date: 06/01/2024 CLINICAL DATA:  Shortness of breath. EXAM: CT HEAD WITHOUT CONTRAST TECHNIQUE: Contiguous axial images were obtained from the base of the skull through the vertex without intravenous contrast. RADIATION DOSE REDUCTION: This exam was performed according to the departmental dose-optimization program which includes automated exposure control, adjustment of the mA and/or kV according to patient size and/or use of iterative reconstruction technique. COMPARISON:  None Available. FINDINGS: Brain: There is generalized cerebral atrophy with widening of the extra-axial spaces and ventricular dilatation. There are areas of decreased attenuation within the white matter tracts of the supratentorial brain, consistent with microvascular disease changes.  Vascular: Marked severity bilateral cavernous carotid calcification is noted. Skull: Normal. Negative for fracture or focal lesion. Sinuses/Orbits: Mild bilateral ethmoid sinus and marked severity bilateral nasal mucosal  thickening is seen. Other: None. IMPRESSION: 1. Generalized cerebral atrophy with widening of the extra-axial spaces and ventricular dilatation. 2. Bilateral ethmoid sinus and marked severity bilateral nasal mucosal thickening. Electronically Signed   By: Suzen Dials M.D.   On: 06/01/2024 22:59   CT Angio Chest PE W and/or Wo Contrast Result Date: 06/01/2024 CLINICAL DATA:  Shortness of breath. EXAM: CT ANGIOGRAPHY CHEST WITH CONTRAST TECHNIQUE: Multidetector CT imaging of the chest was performed using the standard protocol during bolus administration of intravenous contrast. Multiplanar CT image reconstructions and MIPs were obtained to evaluate the vascular anatomy. RADIATION DOSE REDUCTION: This exam was performed according to the departmental dose-optimization program which includes automated exposure control, adjustment of the mA and/or kV according to patient size and/or use of iterative reconstruction technique. CONTRAST:  75mL OMNIPAQUE IOHEXOL 350 MG/ML SOLN COMPARISON:  July 25, 2022 FINDINGS: Cardiovascular: There is marked severity calcification of the thoracic aorta without evidence of aortic aneurysm or dissection. Satisfactory opacification of the pulmonary arteries to the segmental level. No evidence of pulmonary embolism. Normal heart size. There is marked severity coronary artery calcification. No pericardial effusion. Mediastinum/Nodes: Endotracheal and orogastric tubes are in place. No enlarged mediastinal, hilar, or axillary lymph nodes. A mild amount of heterogeneous low-attenuation is seen within the posterior aspect of the trachea at the level of the carina and proximal left mainstem bronchus. The thyroid  gland and esophagus demonstrate no significant findings. Lungs/Pleura: Mild lingular and mild bibasilar linear scarring and/or atelectasis is seen. Mild to moderate severity bronchiectasis is also noted within the bilateral lower lobes. No pleural effusion or pneumothorax is  identified. Upper Abdomen: No acute abnormality. Musculoskeletal: Multilevel degenerative changes are seen throughout the thoracic spine. Review of the MIP images confirms the above findings. IMPRESSION: 1. No evidence of pulmonary embolism. 2. Mild lingular and mild bibasilar linear scarring and/or atelectasis. 3. Mild to moderate severity bilateral lower lobe bronchiectasis. 4. Marked severity coronary artery calcification. 5. Low attenuation within the trachea which may represent a mild amount of mucus. 6. Aortic atherosclerosis. Electronically Signed   By: Suzen Dials M.D.   On: 06/01/2024 22:57   DG Abdomen 1 View Result Date: 06/01/2024 EXAM: 1 VIEW XRAY OF THE ABDOMEN 06/01/2024 09:26:00 PM COMPARISON: None available. CLINICAL HISTORY: og tube. FINDINGS: LINES, TUBES AND DEVICES: Enteric tube in place with tip in the stomach. Side port is not identified. BOWEL: Nonobstructive bowel gas pattern. SOFT TISSUES: No opaque urinary calculi. BONES: No acute osseous abnormality. IMPRESSION: 1. Enteric tube tip in the stomach with side port not identified. Electronically signed by: Norman Gatlin MD 06/01/2024 09:35 PM EDT RP Workstation: [id number]   DG Chest Portable 1 View Result Date: 06/01/2024 EXAM: 1 VIEW(S) XRAY OF THE CHEST 06/01/2024 09:25:00 PM COMPARISON: 03/22/2024 CLINICAL HISTORY: post intubation. post intubation FINDINGS: LINES, TUBES AND DEVICES: Endotracheal tube with tip 6 cm above the carina. Enteric tube coursing below the hemidiaphragm. LUNGS AND PLEURA: Lung hyperinflation. No focal pulmonary opacity. No pulmonary edema. No pleural effusion. No pneumothorax. HEART AND MEDIASTINUM: Aortic calcification. No acute abnormality of the cardiac and mediastinal silhouettes. BONES AND SOFT TISSUES: No acute osseous abnormality. IMPRESSION: 1. Endotracheal tube tip 6 cm above the carina. 2. Lung hyperinflation. Electronically signed by: Norman Gatlin MD 06/01/2024 09:34 PM EDT RP Workstation:  [id number]    Microbiology: Results  for orders placed or performed during the hospital encounter of 06/01/24  Resp panel by RT-PCR (RSV, Flu A&B, Covid) Anterior Nasal Swab     Status: None   Collection Time: 06/01/24  9:14 PM   Specimen: Anterior Nasal Swab  Result Value Ref Range Status   SARS Coronavirus 2 by RT PCR NEGATIVE NEGATIVE Final    Comment: (NOTE) SARS-CoV-2 target nucleic acids are NOT DETECTED.  The SARS-CoV-2 RNA is generally detectable in upper respiratory specimens during the acute phase of infection. The lowest concentration of SARS-CoV-2 viral copies this assay can detect is 138 copies/mL. A negative result does not preclude SARS-Cov-2 infection and should not be used as the sole basis for treatment or other patient management decisions. A negative result may occur with  improper specimen collection/handling, submission of specimen other than nasopharyngeal swab, presence of viral mutation(s) within the areas targeted by this assay, and inadequate number of viral copies(<138 copies/mL). A negative result must be combined with clinical observations, patient history, and epidemiological information. The expected result is Negative.  Fact Sheet for Patients:  BloggerCourse.com  Fact Sheet for Healthcare Providers:  SeriousBroker.it  This test is no t yet approved or cleared by the [place]  FDA and  has been authorized for detection and/or diagnosis of SARS-CoV-2 by FDA under an Emergency Use Authorization (EUA). This EUA will remain  in effect (meaning this test can be used) for the duration of the COVID-19 declaration under Section 564(b)(1) of the Act, 21 U.S.C.section 360bbb-3(b)(1), unless the authorization is terminated  or revoked sooner.       Influenza A by PCR NEGATIVE NEGATIVE Final   Influenza B by PCR NEGATIVE NEGATIVE Final    Comment: (NOTE) The Xpert Xpress SARS-CoV-2/FLU/RSV plus assay  is intended as an aid in the diagnosis of influenza from Nasopharyngeal swab specimens and should not be used as a sole basis for treatment. Nasal washings and aspirates are unacceptable for Xpert Xpress SARS-CoV-2/FLU/RSV testing.  Fact Sheet for Patients: BloggerCourse.com  Fact Sheet for Healthcare Providers: SeriousBroker.it  This test is not yet approved or cleared by the [place]  FDA and has been authorized for detection and/or diagnosis of SARS-CoV-2 by FDA under an Emergency Use Authorization (EUA). This EUA will remain in effect (meaning this test can be used) for the duration of the COVID-19 declaration under Section 564(b)(1) of the Act, 21 U.S.C. section 360bbb-3(b)(1), unless the authorization is terminated or revoked.     Resp Syncytial Virus by PCR NEGATIVE NEGATIVE Final    Comment: (NOTE) Fact Sheet for Patients: BloggerCourse.com  Fact Sheet for Healthcare Providers: SeriousBroker.it  This test is not yet approved or cleared by the [place]  FDA and has been authorized for detection and/or diagnosis of SARS-CoV-2 by FDA under an Emergency Use Authorization (EUA). This EUA will remain in effect (meaning this test can be used) for the duration of the COVID-19 declaration under Section 564(b)(1) of the Act, 21 U.S.C. section 360bbb-3(b)(1), unless the authorization is terminated or revoked.  Performed at [facility], [address] [place], [place] [address]   Blood culture (routine x 2)     Status: None   Collection Time: 06/01/24  9:31 PM   Specimen: BLOOD  Result Value Ref Range Status   Specimen Description BLOOD BLOOD RIGHT HAND  Final   Special Requests NONE  Final   Culture   Final    NO GROWTH 5 DAYS Performed at [facility], [address], [place], [place] [address]  Report Status 06/06/2024 FINAL  Final  Blood culture (routine x 2)      Status: None   Collection Time: 06/01/24  9:35 PM   Specimen: BLOOD  Result Value Ref Range Status   Specimen Description BLOOD  Final   Special Requests NONE  Final   Culture   Final    NO GROWTH 5 DAYS Performed at [facility], [address] [place], [place] [address]    Report Status 06/06/2024 FINAL  Final  MRSA Next Gen by PCR, Nasal     Status: None   Collection Time: 06/02/24  1:55 AM   Specimen: Nasal Mucosa; Nasal Swab  Result Value Ref Range Status   MRSA by PCR Next Gen NOT DETECTED NOT DETECTED Final    Comment: (NOTE) The GeneXpert MRSA Assay (FDA approved for NASAL specimens only), is one component of a comprehensive MRSA colonization surveillance program. It is not intended to diagnose MRSA infection nor to guide or monitor treatment for MRSA infections. Test performance is not FDA approved in patients less than 57 years old. Performed at [facility], [address], [place], [place] [address]   Respiratory (~20 pathogens) panel by PCR     Status: None   Collection Time: 06/05/24  4:27 PM   Specimen: Nasopharyngeal Swab; Respiratory  Result Value Ref Range Status   Adenovirus NOT DETECTED NOT DETECTED Final   Coronavirus 229E NOT DETECTED NOT DETECTED Final    Comment: (NOTE) The Coronavirus on the Respiratory Panel, DOES NOT test for the novel  Coronavirus (2019 nCoV)    Coronavirus HKU1 NOT DETECTED NOT DETECTED Final   Coronavirus NL63 NOT DETECTED NOT DETECTED Final   Coronavirus OC43 NOT DETECTED NOT DETECTED Final   Metapneumovirus NOT DETECTED NOT DETECTED Final   Rhinovirus / Enterovirus NOT DETECTED NOT DETECTED Final   Influenza A NOT DETECTED NOT DETECTED Final   Influenza B NOT DETECTED NOT DETECTED Final   Parainfluenza Virus 1 NOT DETECTED NOT DETECTED Final   Parainfluenza Virus 2 NOT DETECTED NOT DETECTED Final   Parainfluenza Virus 3 NOT DETECTED NOT DETECTED Final   Parainfluenza Virus 4 NOT DETECTED NOT DETECTED Final    Respiratory Syncytial Virus NOT DETECTED NOT DETECTED Final   Bordetella pertussis NOT DETECTED NOT DETECTED Final   Bordetella Parapertussis NOT DETECTED NOT DETECTED Final   Chlamydophila pneumoniae NOT DETECTED NOT DETECTED Final   Mycoplasma pneumoniae NOT DETECTED NOT DETECTED Final    Comment: Performed at [facility], [address], [place], [place] [address]    Labs: CBC: Recent Labs  Lab 06/19/24 0424  WBC 10.6*  HGB 10.9*  HCT 32.7*  MCV 94.8  PLT 426*   Basic Metabolic Panel: Recent Labs  Lab 06/18/24 1646 06/19/24 0424  NA 132*  --   K 5.0  --   CL 95*  --   CO2 26  --   GLUCOSE 144*  --   BUN 26*  --   CREATININE 0.87  --   CALCIUM  8.0*  --   MG  --  2.0   Liver Function Tests: Recent Labs  Lab 06/18/24 1646  AST 28  ALT 67*  ALKPHOS 69  BILITOT 1.6*  PROT 5.4*  ALBUMIN 2.8*   CBG: Recent Labs  Lab 06/19/24 1617 06/19/24 1959 06/19/24 2316 06/20/24 0429 06/20/24 0823  GLUCAP 157* 144* 116* 96 120*    Discharge time spent: greater than 30 minutes.  Signed: Juancarlos Crescenzo, MD [facility] 06/20/2024

## 2024-06-20 NOTE — Plan of Care (Signed)
 Problem: Education: Goal: Knowledge of General Education information will improve Description: Including pain rating scale, medication(s)/side effects and non-pharmacologic comfort measures 06/20/2024 1929 by Norville President, RN Outcome: Progressing 06/20/2024 1928 by Norville President, RN Outcome: Progressing   Problem: Health Behavior/Discharge Planning: Goal: Ability to manage health-related needs will improve 06/20/2024 1929 by Norville President, RN Outcome: Progressing 06/20/2024 1928 by Norville President, RN Outcome: Progressing   Problem: Clinical Measurements: Goal: Ability to maintain clinical measurements within normal limits will improve 06/20/2024 1929 by Norville President, RN Outcome: Progressing 06/20/2024 1928 by Norville President, RN Outcome: Progressing Goal: Will remain free from infection 06/20/2024 1929 by Norville President, RN Outcome: Progressing 06/20/2024 1928 by Norville President, RN Outcome: Progressing Goal: Diagnostic test results will improve 06/20/2024 1929 by Norville President, RN Outcome: Progressing 06/20/2024 1928 by Norville President, RN Outcome: Progressing Goal: Respiratory complications will improve 06/20/2024 1929 by Norville President, RN Outcome: Progressing 06/20/2024 1928 by Norville President, RN Outcome: Progressing Goal: Cardiovascular complication will be avoided 06/20/2024 1929 by Norville President, RN Outcome: Progressing 06/20/2024 1928 by Norville President, RN Outcome: Progressing   Problem: Activity: Goal: Risk for activity intolerance will decrease 06/20/2024 1929 by Norville President, RN Outcome: Progressing 06/20/2024 1928 by Norville President, RN Outcome: Progressing   Problem: Nutrition: Goal: Adequate nutrition will be maintained 06/20/2024 1929 by Norville President, RN Outcome: Progressing 06/20/2024 1928 by Norville President, RN Outcome: Progressing   Problem: Coping: Goal: Level of anxiety will decrease 06/20/2024 1929 by Norville President, RN Outcome:  Progressing 06/20/2024 1928 by Norville President, RN Outcome: Progressing   Problem: Elimination: Goal: Will not experience complications related to bowel motility 06/20/2024 1929 by Norville President, RN Outcome: Progressing 06/20/2024 1928 by Norville President, RN Outcome: Progressing Goal: Will not experience complications related to urinary retention 06/20/2024 1929 by Norville President, RN Outcome: Progressing 06/20/2024 1928 by Norville President, RN Outcome: Progressing   Problem: Pain Managment: Goal: General experience of comfort will improve and/or be controlled 06/20/2024 1929 by Norville President, RN Outcome: Progressing 06/20/2024 1928 by Norville President, RN Outcome: Progressing   Problem: Safety: Goal: Ability to remain free from injury will improve 06/20/2024 1929 by Norville President, RN Outcome: Progressing 06/20/2024 1928 by Norville President, RN Outcome: Progressing   Problem: Skin Integrity: Goal: Risk for impaired skin integrity will decrease 06/20/2024 1929 by Norville President, RN Outcome: Progressing 06/20/2024 1928 by Norville President, RN Outcome: Progressing   Problem: Safety: Goal: Non-violent Restraint(s) 06/20/2024 1929 by Norville President, RN Outcome: Progressing 06/20/2024 1928 by Norville President, RN Outcome: Progressing   Problem: Education: Goal: Ability to describe self-care measures that may prevent or decrease complications (Diabetes Survival Skills Education) will improve 06/20/2024 1929 by Norville President, RN Outcome: Progressing 06/20/2024 1928 by Norville President, RN Outcome: Progressing Goal: Individualized Educational Video(s) 06/20/2024 1929 by Norville President, RN Outcome: Progressing 06/20/2024 1928 by Norville President, RN Outcome: Progressing   Problem: Coping: Goal: Ability to adjust to condition or change in health will improve 06/20/2024 1929 by Norville President, RN Outcome: Progressing 06/20/2024 1928 by Norville President, RN Outcome: Progressing   Problem: Fluid  Volume: Goal: Ability to maintain a balanced intake and output will improve 06/20/2024 1929 by Norville President, RN Outcome: Progressing 06/20/2024 1928 by Norville President, RN Outcome: Progressing   Problem: Health Behavior/Discharge Planning: Goal: Ability to identify and utilize available resources and services will improve 06/20/2024 1929 by Norville President, RN Outcome: Progressing 06/20/2024 1928 by Norville President, RN Outcome: Progressing Goal: Ability to manage health-related needs will improve 06/20/2024 1929 by  Norville President, RN Outcome: Progressing 06/20/2024 1928 by Norville President, RN Outcome: Progressing   Problem: Metabolic: Goal: Ability to maintain appropriate glucose levels will improve 06/20/2024 1929 by Norville President, RN Outcome: Progressing 06/20/2024 1928 by Norville President, RN Outcome: Progressing   Problem: Nutritional: Goal: Maintenance of adequate nutrition will improve 06/20/2024 1929 by Norville President, RN Outcome: Progressing 06/20/2024 1928 by Norville President, RN Outcome: Progressing Goal: Progress toward achieving an optimal weight will improve 06/20/2024 1929 by Norville President, RN Outcome: Progressing 06/20/2024 1928 by Norville President, RN Outcome: Progressing   Problem: Skin Integrity: Goal: Risk for impaired skin integrity will decrease 06/20/2024 1929 by Norville President, RN Outcome: Progressing 06/20/2024 1928 by Norville President, RN Outcome: Progressing   Problem: Tissue Perfusion: Goal: Adequacy of tissue perfusion will improve 06/20/2024 1929 by Norville President, RN Outcome: Progressing 06/20/2024 1928 by Norville President, RN Outcome: Progressing

## 2024-06-20 NOTE — Progress Notes (Signed)
                                                                                                                                                                                                          Palliative Medicine Progress Note   Patient Name: Martin Ford       Date: 06/20/2024 DOB: [birth year]  Age: 88 y.o. MRN#: [id number] Attending Physician: Roann Gouty, MD Primary Care Physician: Tobie Suzzane POUR, MD Admit Date: 06/01/2024   HPI/Patient Profile: 88 y.o. male  with past medical history of COPD, chronic respiratory failure on home O2, aortic stenosis, and GERD who presented to the ED on 06/01/2024 with complaint of shortness of breath and confusion.  He was admitted with acute on chronic hypoxic/hypercapnic respiratory failure secondary to AECOPD. He required intubation 9/29 and was extubated 10/3.  Hospitalization has been complicated by AKI, hypernatremia, dysphagia, malnutrition, and delirium. He has remained NPO due to decreased alertness and had cortrak placed on 10/6.   Palliative Medicine has been consulted for goals of care discussions. and complex medical decision making.    Subjective: Chart reviewed.  Update received from RN.  Patient assessed.  Objective:  Physical Exam Vitals reviewed.  Constitutional:      General: He is not in acute distress.    Comments: Frail and chronically ill-appearing  Pulmonary:     Effort: Pulmonary effort is normal.  Neurological:     Mental Status: He is alert.  Psychiatric:        Cognition and Memory: Memory is impaired.              Palliative Medicine Assessment & Plan   Assessment: Principal Problem:   COPD (chronic obstructive pulmonary disease) (HCC) Active Problems:   Acute and chronic respiratory failure (acute-on-chronic) (HCC)   Malnutrition of moderate degree    Recommendations/Plan: Continue supportive care Patient/wife decided not to pursue artificial feeding Goal of care is medical stabilization,  rehab to improve functional status, and ultimately to return home  Goals of Care and Additional Recommendations: Limitations on Scope of Treatment: {Recommended Scope and Preferences:21019}  Code Status: DNR with limited interventions  Prognosis:  Unable to determine  Discharge Planning: SNF/rehab   Thank you for allowing the Palliative Medicine Team to assist in the care of this patient.   ***   Recardo KATHEE Loll, NP   Please contact Palliative Medicine Team phone at [phone] for questions and concerns.  For individual providers, please see [facility].

## 2024-06-20 NOTE — Plan of Care (Signed)
  Problem: Clinical Measurements: ?Goal: Ability to maintain clinical measurements within normal limits will improve ?Outcome: Progressing ?  ?Problem: Nutrition: ?Goal: Adequate nutrition will be maintained ?Outcome: Progressing ?  ?Problem: Skin Integrity: ?Goal: Risk for impaired skin integrity will decrease ?Outcome: Progressing ?  ?

## 2024-06-21 LAB — GLUCOSE, CAPILLARY
Glucose-Capillary: 102 mg/dL — ABNORMAL HIGH (ref 70–99)
Glucose-Capillary: 105 mg/dL — ABNORMAL HIGH (ref 70–99)
Glucose-Capillary: 115 mg/dL — ABNORMAL HIGH (ref 70–99)
Glucose-Capillary: 152 mg/dL — ABNORMAL HIGH (ref 70–99)

## 2024-06-21 MED ORDER — CHLORHEXIDINE GLUCONATE CLOTH 2 % EX PADS
6.0000 | MEDICATED_PAD | Freq: Every day | CUTANEOUS | Status: DC
  Administered 2024-06-21: 6 via TOPICAL

## 2024-06-21 NOTE — TOC Progression Note (Addendum)
 Transition of Care [facility]) - Progression Note    Patient Details  Name: Martin Ford MRN: [id number] Date of Birth: June 13, 1936  [facility]) CM/SW Contact  Lynnleigh Soden A [place], LCSW Phone Number: 06/21/2024, 10:00 AM  Clinical Narrative:     CSW informed pt's DC was delayed yesterday due to urination issues per RN. Pt to obtain foley and proceed with DC today. CSW reached out to [facility], updated on pt's plan, confirmed can DC today.   Per MD patient ready for DC to [facility]. RN to call report prior to discharge [phone]; room A1-2). RN, patient, patient's family, and facility notified of DC. Discharge Summary and FL2 sent to facility. DC packet on chart. Ambulance transport requested for patient 12:30pm   CSW will sign off for now as social work intervention is no longer needed. Please consult us  again if new needs arise.    Expected Discharge Plan: Skilled Nursing Facility Barriers to Discharge: Barriers Resolved               Expected Discharge Plan and Services In-house Referral: Clinical Social Work   Post Acute Care Choice: Skilled Nursing Facility Living arrangements for the past 2 months: Single Family Home Expected Discharge Date: 06/20/24                                     Social Drivers of Health (SDOH) Interventions SDOH Screenings   Food Insecurity: No Food Insecurity (06/01/2022)  Housing: Low Risk  (06/06/2024)  Transportation Needs: No Transportation Needs (06/06/2024)  Utilities: Patient Unable To Answer (06/03/2024)  Alcohol Screen: Low Risk  (06/01/2022)  Depression (PHQ2-9): Low Risk  (02/21/2024)  Financial Resource Strain: Low Risk  (06/01/2022)  Physical Activity: Inactive (06/01/2022)  Social Connections: Unknown (06/01/2022)  Stress: No Stress Concern Present (06/01/2022)  Tobacco Use: Medium Risk (06/02/2024)    Readmission Risk Interventions     No data to display

## 2024-06-21 NOTE — Progress Notes (Signed)
 1100 -- called [facility] to attempt to give report.   No answer after receptionist transferred me to unit, line rang and then went to multiple beeps and disconnected.    Called back and informed receptionist of no answer/call disconnection, receptionist transferred me again and RN waited on hold for 12 minutes with no answer.  1140 - Tried to call report again to [facility], again no answer after receptionist transferred me to unit, line rang and then went to multiple beeps and disconnected.  1220 - Reached Abra at [facility] and report given.

## 2024-06-21 NOTE — Discharge Summary (Signed)
 Physician Discharge Summary   Patient: Martin Ford MRN: [id number] DOB: [birth year]  Admit date:     06/01/2024  Discharge date: 06/21/24  Discharge Physician: Vernal Alstrom   PCP: Tobie Suzzane POUR, MD   Recommendations at discharge:   Continue taking albuterol  nebulization, budesonide nebulization Continue care and transition to hospice/palliative from skilled nursing facility Please consider outpatient voiding trial for Foley catheter removal in few days.  Discharge Diagnoses: Principal Problem:   COPD (chronic obstructive pulmonary disease) (HCC) Active Problems:   Acute and chronic respiratory failure (acute-on-chronic) (HCC)   Malnutrition of moderate degree  Resolved Problems:   * No resolved hospital problems. [facility] Course: Patient is a 88 y.o. male  with past medical history of COPD, chronic respiratory failure on home O2 at 2 L/min, aortic stenosis, and GERD  presented to the hospital on 06/01/2024 with complaint of shortness of breath and confusion.  He was initially intubated on 06/01/2024 and was extubated 06/05/2024 for acute exacerbation of COPD.[name]  Hospitalization was complicated with AKI hyponatremia dysphagia malnutrition and delirium.  Subsequently her mentation had improved and cortrak tube tube was removed and was seen by speech therapist.  PT OT saw the patient and recommended rehab placement.  Plan is for initial rehab followed by hospice.    Assessment and plan  Acute on chronic hypoxic/hypercapnic respiratory failure secondary to COPD exacerbation. Aspiration pneumonia. At baseline patient is on 2 L of oxygen  by nasal cannula.  Was intubated on 06/01/2024 and extubated 06/05/2024.  Patient with poor cough.  Continue chest PT.  Patient had difficulty use BiPAP in the setting of Cortrak..  Status post IV Unasyn Solu-Medrol.  Continue nebulizer treatment.   AKI: resolved Improved during hospitalization.  Latest creatinine was 0.8.   Hypernatremia. Resolved  latest sodium was 132.   Mitral valve prolapse. Moderate aortic stenosis. Moderate aortic regurgitation. ICU team had discussed with cardiologist Dr  Ren Maha.  Recommended to follow as an outpatient Hyperglycemia/hypoglycemia: Resolved  Initially required D10 as well.  Latest POC glucose of 115   Dysphagia. Poor p.o. intake. Seen by speech therapy on 10/16 and was advised mechanical soft diet.  Cortrak tube tube was removed at the time.  Currently on dysphagia 3 diet.  Moderate protein calorie malnutrition Body mass index is 19.34 kg/m.  Continue supplementation.  Poor prognosis    Acute bilateral distal DVT. CT scan of the chest was negative for pulmonary embolism. Lower extremity Doppler on 10/3 positive for bilateral distal DVT. Continue Eliquis   Diarrhea: Resolved   Delirium. Required Precedex in the ICU.  Continue Seroquel.    Hypertension. On as needed hydralazine.  Goals of care. Palliative care has been consulted.  Patient is DNR.   Urinary retention.  Patient has required Foley catheter.  Will continue on discharge.  Will need outpatient voiding trial.  A Consultants: Palliative care Procedures performed: Foley catheter placement Disposition: Skilled nursing facility Diet recommendation:  Regular diet DISCHARGE MEDICATION: Allergies as of 06/21/2024   No Known Allergies      Medication List     STOP taking these medications    [employer] 500-32.5 MG Tabs Generic drug: Aspirin-Caffeine   doxycycline  100 MG tablet Commonly known as: VIBRA -TABS   predniSONE  10 MG tablet Commonly known as: DELTASONE        TAKE these medications    acetaminophen 325 MG tablet Commonly known as: TYLENOL Take 2 tablets (650 mg total) by mouth every 6 (six) hours as needed  for mild pain (pain score 1-3), moderate pain (pain score 4-6), fever or headache.   albuterol  108 (90 Base) MCG/ACT inhaler Commonly known as: VENTOLIN  HFA USE 2 INHALATIONS  BY MOUTH EVERY 6 HOURS AS NEEDED FOR WHEEZING  OR SHORTNESS OF BREATH   apixaban 5 MG Tabs tablet Commonly known as: ELIQUIS Take 1 tablet (5 mg total) by mouth 2 (two) times daily.   budesonide 0.25 MG/2ML nebulizer solution Commonly known as: PULMICORT Take 2 mLs (0.25 mg total) by nebulization 2 (two) times daily.   ipratropium-albuterol  0.5-2.5 (3) MG/3ML Soln Commonly known as: DUONEB Take 3 mLs by nebulization every 6 (six) hours as needed.   mouth rinse Liqd solution 15 mLs by Mouth Rinse route as needed (oral care).   pantoprazole  40 MG tablet Commonly known as: Protonix  Take 1 tablet (40 mg total) by mouth daily. Take 30-60 min before first meal of the day What changed:  when to take this reasons to take this additional instructions   QUEtiapine 50 MG tablet Commonly known as: SEROQUEL Take 1 tablet (50 mg total) by mouth at bedtime.        Contact information for after-discharge care     Destination     [facility] .   Service: Skilled Nursing Contact information: [address] [place]  [address] [phone]                    Discharge Exam: Filed Weights   06/19/24 0500 06/20/24 0500 06/21/24 0341  Weight: 66.1 kg 67 kg 65.7 kg    Examined at bedside. Lungs bilateral decreased air entry at the bases with bilateral coarse crackles occasional scattered wheezing present Heart regular sinus Abdomen soft bowel supple Extremities no edema skin Skin no rashes  Condition at discharge: fair  The results of significant diagnostics from this hospitalization (including imaging, microbiology, ancillary and laboratory) are listed below for reference.   Imaging Studies: DG ESOPHAGUS W SINGLE CM (SOL OR THIN BA) Result Date: 06/19/2024 CLINICAL DATA:  Pneumonia and dysphagia with poor PO intake. Suspected aspiration. EXAM: ESOPHAGUS/BARIUM SWALLOW/TABLET STUDY TECHNIQUE: Single contrast  examination was performed using thin liquid barium. Exam significantly limited secondary to patient immobility, unable to stand or roll over on fluoro table, as well as limited p.o. intake. This exam was performed by Kimble Clas, PA-C, and was supervised and interpreted by Dr. Norleen Kil. FLUOROSCOPY: Radiation Exposure Index (as provided by the fluoroscopic device): 42.00 mGy Kerma COMPARISON:  None Available. FINDINGS: Swallowing: Limited assessment due to patient inability to be placed in erect lateral position. No gross abnormality seen on limited oblique projections. Pharynx: Limited assessment, as noted above.  No gross abnormality. Esophagus: Suboptimal assessment due to patient's inability to forearm rapid bolus swallowing. No No evidence of esophageal obstruction, mass, or definite stricture. Esophageal motility: Mild to moderate esophageal dysmotility. Hiatal Hernia: None. Gastroesophageal reflux: Mild reflux to the level of the distal esophagus. Ingested 13 mm barium tablet: Became stuck in the midthoracic esophagus despite multiple sips of water and thin barium. Observed for greater than 3 minutes. No evidence of focal narrowing or stricture. This is likely due to combination of dysmotility and patient's inability to be placed in erect position. Other: None. IMPRESSION: Limited exam 1. Technically limited exam, as discussed above. 2. Mild-to-moderate esophageal dysmotility. 3. Mild gastroesophageal reflux, without evidence of hiatal hernia or esophageal stricture. 4. 13 mm barium tablet became stuck in the midthoracic esophagus. No evidence of  focal narrowing or stricture. This is likely due to combination of esophageal dysmotility in patient's inability to be placed in erect position. Electronically Signed   By: Norleen DELENA Kil M.D.   On: 06/19/2024 18:21   DG Swallowing Func-Speech Pathology Result Date: 06/12/2024 Table formatting from the original result was not included. Modified Barium Swallow  Study Patient Details Name: Martin Ford MRN: [id number] Date of Birth: 03-Jul-1936 Today's Date: 06/12/2024 HPI/PMH: HPI: Pt is a 88 y.o. male who presented with worsening dyspnea despite increase in O2 from baseline 2L to 4L. He was taken to [facility] ED where he required intubation. CTA chest demonstrated Bronchiectasis, no PE. CT head demonstrated no acute issues, generalized atrophy and sinus thickening. ETT 9/29- 10/3 (5 days). Failed [facility] Swallow Screen 10/4. PMH: COPD Stage 3 by GOLD (PFT's Dec 2022 with FEV1 0.95 (38%), ratio 0.48) and chronically on 2L O2. Clinical Impression: Clinical Impression: Pt has a mild pharyngeal dysphagia (DIGEST score of 1) with possible esophageal component as well. He became more short of breath as trials continued, but only exhibited difficulty coordinating breathing and swallowing x1 when drinking thin liquids via straw. Aspiration was sensed, but not able to visualize if it cleared (PAS 7). On other trials, pt achieved better timing. He had some reduced anterior hyoid excursion UES opening but this only had a functional impact on swallowing the barium tablet, although question if presence of Cortrak may have also contributed. The tablet was initially stuck in the valleculae, requiring both additional purees and a chin tuck to clear it. It was then delayed in the proximal esophagus, and then the distal esophagus, not observed to fully exit the esophagus. Recommend initiating Dys 3 (mechanical soft) diet and thin liquids by cup, as well as meds crushed in puree. DIGEST Swallow Severity Rating*  Safety: 1  Efficiency: 0  Overall Pharyngeal Swallow Severity: 1: mild; 2: moderate; 3: severe; 4: profound *The Dynamic Imaging Grade of Swallowing Toxicity is standardized for the head and neck cancer population, however, demonstrates promising clinical applications across populations to standardize the clinical rating of pharyngeal swallow safety and severity. Factors that may increase risk  of adverse event in presence of aspiration Noe & Lianne 2021): Factors that may increase risk of adverse event in presence of aspiration Noe & Lianne 2021): Respiratory or GI disease; Frail or deconditioned Recommendations/Plan: Swallowing Evaluation Recommendations Swallowing Evaluation Recommendations Recommendations: PO diet PO Diet Recommendation: Dysphagia 3 (Mechanical soft); Thin liquids (Level 0) Liquid Administration via: Cup; No straw Medication Administration: Crushed with puree Supervision: Staff to assist with self-feeding; Full supervision/cueing for swallowing strategies Swallowing strategies  : Minimize environmental distractions; Slow rate; Small bites/sips; Follow solids with liquids (take breaks for breathing PRN) Postural changes: Position pt fully upright for meals; Stay upright 30-60 min after meals Oral care recommendations: Oral care BID (2x/day) Treatment Plan Treatment Plan Treatment recommendations: Therapy as outlined in treatment plan below Follow-up recommendations: Skilled nursing-short term rehab (<3 hours/day) Functional status assessment: Patient has had a recent decline in their functional status and demonstrates the ability to make significant improvements in function in a reasonable and predictable amount of time. Treatment frequency: Min 2x/week Treatment duration: 2 weeks Interventions: Aspiration precaution training; Compensatory techniques; Patient/family education; Trials of upgraded texture/liquids; Diet toleration management by SLP Recommendations Recommendations for follow up therapy are one component of a multi-disciplinary discharge planning process, led by the attending physician.  Recommendations may be updated based on patient status, additional functional criteria and insurance authorization. Assessment: Orofacial  Exam: Orofacial Exam Oral Cavity - Dentition: Missing dentition Anatomy: Anatomy: WFL Boluses Administered: Boluses Administered Boluses  Administered: Thin liquids (Level 0); Mildly thick liquids (Level 2, nectar thick); Puree; Solid  Oral Impairment Domain: Oral Impairment Domain Lip Closure: No labial escape Tongue control during bolus hold: Escape to lateral buccal cavity/floor of mouth Bolus preparation/mastication: Timely and efficient chewing and mashing Bolus transport/lingual motion: Brisk tongue motion Oral residue: Complete oral clearance Location of oral residue : N/A Initiation of pharyngeal swallow : Valleculae  Pharyngeal Impairment Domain: Pharyngeal Impairment Domain Soft palate elevation: No bolus between soft palate (SP)/pharyngeal wall (PW) Laryngeal elevation: Complete superior movement of thyroid  cartilage with complete approximation of arytenoids to epiglottic petiole Anterior hyoid excursion: Partial anterior movement Epiglottic movement: Complete inversion Laryngeal vestibule closure: Complete, no air/contrast in laryngeal vestibule Pharyngeal stripping wave : Present - complete Pharyngeal contraction (A/P view only): N/A Pharyngoesophageal segment opening: Partial distention/partial duration, partial obstruction of flow Tongue base retraction: No contrast between tongue base and posterior pharyngeal wall (PPW) Pharyngeal residue: Complete pharyngeal clearance Location of pharyngeal residue: N/A  Esophageal Impairment Domain: Esophageal Impairment Domain Esophageal clearance upright position: Esophageal retention Pill: Pill Consistency administered: Thin liquids (Level 0) Thin liquids (Level 0): Impaired (see clinical impressions) Penetration/Aspiration Scale Score: Penetration/Aspiration Scale Score 1.  Material does not enter airway: Mildly thick liquids (Level 2, nectar thick); Moderately thick liquids (Level 3, honey thick); Puree; Solid; Pill 7.  Material enters airway, passes BELOW cords and not ejected out despite cough attempt by patient: Thin liquids (Level 0) Compensatory Strategies: No data recorded  General  Information: Caregiver present: No  Diet Prior to this Study: NPO   Temperature : Normal   Respiratory Status: Tachypneic   Supplemental O2: Nasal cannula   History of Recent Intubation: Yes  Behavior/Cognition: Alert; Cooperative; Pleasant mood; Requires cueing No data recorded Baseline vocal quality/speech: Normal Volitional Cough: Able to elicit Volitional Swallow: Able to elicit Exam Limitations: No limitations Goal Planning: Prognosis for improved oropharyngeal function: Good No data recorded No data recorded Patient/Family Stated Goal: none stated Consulted and agree with results and recommendations: Patient Pain: Pain Assessment Pain Assessment: Faces Faces Pain Scale: 0 Pain Intervention(s): Monitored during session End of Session: Start Time:SLP Start Time (ACUTE ONLY): 1049 Stop Time: SLP Stop Time (ACUTE ONLY): 1103 Time Calculation:SLP Time Calculation (min) (ACUTE ONLY): 14 min Charges: SLP Evaluations $ SLP Speech Visit: 1 Visit SLP Evaluations $MBS Swallow: 1 Procedure $Swallowing Treatment: 1 Procedure SLP visit diagnosis: SLP Visit Diagnosis: Dysphagia, pharyngoesophageal phase (R13.14) Past Medical History: Past Medical History: Diagnosis Date  Emphysema of lung (HCC)   Essential tremor   Hypertension  Past Surgical History: No past surgical history on file. Leita SAILOR., M.A. CCC-SLP Acute Rehabilitation Services Office: [phone] Secure chat preferred 06/12/2024, 1:05 PM  DG CHEST PORT 1 VIEW Result Date: 06/09/2024 CLINICAL DATA:  Shortness of breath. EXAM: PORTABLE CHEST 1 VIEW COMPARISON:  06/07/2024 FINDINGS: Stable normal-sized heart and mildly tortuous and calcified thoracic aorta. Decreased prominence of the interstitial markings in the left mid and lower lung zones. Mildly increased patchy density in the left lower lobe and mildly improved patchy density at the right lung base. Bilateral shoulder degenerative changes, marked on the left. IMPRESSION: 1. Mildly increased probable  pneumonia in the left lower lobe and mildly improved mild probable pneumonia at the right lung base. 2. Decreased prominence of the interstitial markings in the left mid and lower lung zones compatible with improving pneumonitis. Electronically Signed  By: Elspeth Bathe M.D.   On: 06/09/2024 14:58   DG Abd Portable 1V Result Date: 06/08/2024 CLINICAL DATA:  [id number] Abdominal pain [id number] EXAM: PORTABLE ABDOMEN - 1 VIEW COMPARISON:  06/01/2024 FINDINGS: Nonobstructive bowel gas pattern.Interval removal of the esophagogastric tube.No pneumoperitoneum. No organomegaly or radiopaque calculi. Multilevel degenerative disc disease of the spine. Patchy airspace opacities in both lung bases again noted, more so on the left than the right. IMPRESSION: Nonobstructive bowel gas pattern. Electronically Signed   By: Rogelia Myers M.D.   On: 06/08/2024 14:16   VAS US  LOWER EXTREMITY VENOUS (DVT) Result Date: 06/08/2024  Lower Venous DVT Study Patient Name:  JAYMIE MISCH  Date of Exam:   06/05/2024 Medical Rec #: [id number]       Accession #:    [id number] Date of Birth: 12/28/1935        Patient Gender: M Patient Age:   77 years Exam Location:  [facility] Procedure:      VAS US  LOWER EXTREMITY VENOUS (DVT) Referring Phys: SAMMI PAWAR --------------------------------------------------------------------------------  Indications: Swelling right > left.  Limitations: Patient coming off sedation and agitated, moving legs away and pushing probe with mittened hands. Acoustic shadowing from arterial calcification. Comparison Study: No prior LEV on file. Patient had ABI done 09/05/22 indicating                   severely reduced bilateral ABIs--R:0.45 L:0.43 Performing Technologist: Alberta Lis RVS  Examination Guidelines: A complete evaluation includes B-mode imaging, spectral Doppler, color Doppler, and power Doppler as needed of all accessible portions of each vessel. Bilateral testing is considered an integral part of a  complete examination. Limited examinations for reoccurring indications may be performed as noted. The reflux portion of the exam is performed with the patient in reverse Trendelenburg.  +---------+---------------+---------+-----------+----------+-----------------+ RIGHT    CompressibilityPhasicitySpontaneityPropertiesThrombus Aging    +---------+---------------+---------+-----------+----------+-----------------+ CFV      Full           Yes      No                                     +---------+---------------+---------+-----------+----------+-----------------+ SFJ      Full                                                           +---------+---------------+---------+-----------+----------+-----------------+ FV Prox  Full           Yes      No                                     +---------+---------------+---------+-----------+----------+-----------------+ FV Mid   Full                                                           +---------+---------------+---------+-----------+----------+-----------------+ FV DistalFull                                                           +---------+---------------+---------+-----------+----------+-----------------+  PFV      Full                                                           +---------+---------------+---------+-----------+----------+-----------------+ POP      Partial        No       No                   Acute             +---------+---------------+---------+-----------+----------+-----------------+ PTV      None                                         Age Indeterminate +---------+---------------+---------+-----------+----------+-----------------+ PERO     None                                         Age Indeterminate +---------+---------------+---------+-----------+----------+-----------------+ Incidentally, the superficial femoral and posterior tibial arteries are occluded. The distal Anterior  tibial artery flow is significantly dampened.  +---------+---------------+---------+-----------+---------------+--------------+ LEFT     CompressibilityPhasicitySpontaneityProperties     Thrombus Aging +---------+---------------+---------+-----------+---------------+--------------+ CFV      Full           Yes      No         pulsatile                                                                 waveforms                     +---------+---------------+---------+-----------+---------------+--------------+ SFJ      Full                                                             +---------+---------------+---------+-----------+---------------+--------------+ FV Prox  Full           Yes      No         pulsatile                                                                 waveforms                     +---------+---------------+---------+-----------+---------------+--------------+ FV Mid   Full           Yes      No         pulsatile  waveforms                     +---------+---------------+---------+-----------+---------------+--------------+ FV DistalFull           Yes      No         pulsatile                                                                 waveforms                     +---------+---------------+---------+-----------+---------------+--------------+ PFV      Full           Yes      No         pulsatile                                                                 waveforms                     +---------+---------------+---------+-----------+---------------+--------------+ POP      None           No       No                        Age                                                                       Indeterminate  +---------+---------------+---------+-----------+---------------+--------------+ PTV      None                                               Age                                                                       Indeterminate  +---------+---------------+---------+-----------+---------------+--------------+ PERO     None                                              Age  Indeterminate  +---------+---------------+---------+-----------+---------------+--------------+ Incidentally, the posterior tibial artery is occluded. The distal anterior tibial artery is significantly dampened into the foot    Summary: RIGHT: - Findings consistent with acute deep vein thrombosis involving the right popliteal vein.  - Findings consistent with age indeterminate deep vein thrombosis involving the right posterior tibial veins, and right peroneal veins.  - No cystic structure found in the popliteal fossa. - Incidentally, the superficial femoral and posterior tibial arteries are occluded. The distal Anterior tibial artery flow is significantly dampened.  LEFT: - Findings consistent with age indeterminate deep vein thrombosis involving the left popliteal vein, left posterior tibial veins, and left peroneal veins.  - No cystic structure found in the popliteal fossa. - Incidentally, the posterior tibial artery is occluded. The distal anterior tibial artery is significantly dampened into the foot  *See table(s) above for measurements and observations. Electronically signed by Gaile New MD on 06/08/2024 at 8:15:46 AM.    Final    DG CHEST PORT 1 VIEW Result Date: 06/07/2024 CLINICAL DATA:  Shortness of breath. EXAM: PORTABLE CHEST 1 VIEW COMPARISON:  Radiograph 06/03/2024 FINDINGS: Interval extubation and removal of enteric tube. The lungs are hyperinflated with bronchial thickening. Development of patchy and reticular airspace disease in both lung bases and left midlung zone. Question small left pleural effusion. No pneumothorax. Stable heart size and  mediastinal contours. IMPRESSION: 1. Interval extubation and removal of enteric tube. 2. Development of patchy and reticular airspace disease in both lung bases and left midlung zone, suspicious for pneumonia. 3. Question small left pleural effusion. Electronically Signed   By: Andrea Gasman M.D.   On: 06/07/2024 19:27   ECHOCARDIOGRAM COMPLETE Result Date: 06/04/2024    ECHOCARDIOGRAM REPORT   Patient Name:   Martin Ford Date of Exam: 06/04/2024 Medical Rec #:  [id number]      Height:       71.0 in Accession #:    [id number]     Weight:       154.8 lb Date of Birth:  December 26, 1935       BSA:          1.891 m Patient Age:    88 years       BP:           118/52 mmHg Patient Gender: M              HR:           64 bpm. Exam Location:  Inpatient Procedure: 2D Echo (Both Spectral and Color Flow Doppler were utilized during            procedure). Indications:    Acute respiratory distress  History:        Patient has prior history of Echocardiogram examinations.  Sonographer:    Charmaine Gaskins Referring Phys: [id number] RAHUL PAWAR  Sonographer Comments: Echo performed with patient supine and on artificial respirator. IMPRESSIONS  1. Left ventricular ejection fraction, by estimation, is 55 to 60%. Left ventricular ejection fraction by 2D MOD biplane is 58.0 %. The left ventricle has normal function. The left ventricle has no regional wall motion abnormalities. Left ventricular diastolic parameters are consistent with Grade I diastolic dysfunction (impaired relaxation).  2. Right ventricular systolic function is normal. The right ventricular size is normal.  3. There is a small mobile mass noted prolapsing into the left atrium, associated with the AMVL, which may be a ruptured cord. The mitral valve is degenerative. Mild mitral valve regurgitation.  4. The  tricuspid valve is abnormal. Tricuspid valve regurgitation is moderate.  5. The aortic valve is tricuspid. There is moderate calcification of the aortic valve. Aortic  valve regurgitation is mild to moderate. Moderate aortic valve stenosis. Aortic valve area, by VTI measures 1.31 cm. Aortic valve mean gradient measures 21.0 mmHg. Aortic valve Vmax measures 3.02 m/s. Peak gradient 36.4 mmHg, DI 0.40. Comparison(s): Changes from prior study are noted. 01/02/2023: LVEF 60-65%, moderate AS, mild AI - MG 21 mmHg. FINDINGS  Left Ventricle: Left ventricular ejection fraction, by estimation, is 55 to 60%. Left ventricular ejection fraction by 2D MOD biplane is 58.0 %. The left ventricle has normal function. The left ventricle has no regional wall motion abnormalities. The left ventricular internal cavity size was normal in size. There is no left ventricular hypertrophy. Left ventricular diastolic parameters are consistent with Grade I diastolic dysfunction (impaired relaxation). Indeterminate filling pressures. Right Ventricle: The right ventricular size is normal. No increase in right ventricular wall thickness. Right ventricular systolic function is normal. Left Atrium: Left atrial size was normal in size. Right Atrium: Right atrial size was normal in size. Pericardium: There is no evidence of pericardial effusion. Mitral Valve: There is a small mobile mass noted prolapsing into the left atrium, associated with the AMVL, which may be a ruptured cord. The mitral valve is degenerative in appearance. Mild mitral valve regurgitation. MV peak gradient, 4.3 mmHg. The mean mitral valve gradient is 2.0 mmHg. Tricuspid Valve: The tricuspid valve is abnormal. Tricuspid valve regurgitation is moderate. Aortic Valve: The aortic valve is tricuspid. There is moderate calcification of the aortic valve. Aortic valve regurgitation is mild to moderate. Moderate aortic stenosis is present. Aortic valve mean gradient measures 21.0 mmHg. Aortic valve peak gradient measures 36.4 mmHg. Aortic valve area, by VTI measures 1.31 cm. Pulmonic Valve: The pulmonic valve was grossly normal. Pulmonic valve  regurgitation is trivial. Aorta: The aortic root and ascending aorta are structurally normal, with no evidence of dilitation. Venous: IVC assessment for right atrial pressure unable to be performed due to mechanical ventilation. IAS/Shunts: No atrial level shunt detected by color flow Doppler.  LEFT VENTRICLE PLAX 2D                        Biplane EF (MOD) LVIDd:         4.74 cm         LV Biplane EF:   Left LVIDs:         3.08 cm                          ventricular LV PW:         0.84 cm                          ejection LV IVS:        0.89 cm                          fraction by LVOT diam:     2.03 cm                          2D MOD LV SV:         79  biplane is LV SV Index:   42                               58.0 %. LVOT Area:     3.24 cm                                Diastology                                LV e' medial:    5.11 cm/s LV Volumes (MOD)               LV E/e' medial:  18.0 LV vol d, MOD    71.5 ml       LV e' lateral:   7.18 cm/s A2C:                           LV E/e' lateral: 12.8 LV vol d, MOD    76.4 ml A4C: LV vol s, MOD    32.1 ml A2C: LV vol s, MOD    30.3 ml A4C: LV SV MOD A2C:   39.4 ml LV SV MOD A4C:   76.4 ml LV SV MOD BP:    44.4 ml RIGHT VENTRICLE RV Basal diam:  3.36 cm RV Mid diam:    3.14 cm RV S prime:     12.40 cm/s LEFT ATRIUM             Index        RIGHT ATRIUM           Index LA diam:        3.23 cm 1.71 cm/m   RA Area:     18.80 cm LA Vol (A2C):   52.6 ml 27.82 ml/m  RA Volume:   52.80 ml  27.92 ml/m LA Vol (A4C):   47.2 ml 24.96 ml/m LA Biplane Vol: 52.1 ml 27.55 ml/m  AORTIC VALVE AV Area (Vmax):    1.06 cm AV Area (Vmean):   1.01 cm AV Area (VTI):     1.31 cm AV Vmax:           301.50 cm/s AV Vmean:          219.000 cm/s AV VTI:            0.602 m AV Peak Grad:      36.4 mmHg AV Mean Grad:      21.0 mmHg LVOT Vmax:         99.00 cm/s LVOT Vmean:        68.600 cm/s LVOT VTI:          0.243 m LVOT/AV VTI ratio: 0.40  AORTA Ao Root  diam: 3.23 cm Ao Asc diam:  3.01 cm MITRAL VALVE               TRICUSPID VALVE MV Area (PHT): 4.15 cm    TR Peak grad:   49.0 mmHg MV Area VTI:   1.96 cm    TR Vmax:        350.00 cm/s MV Peak grad:  4.3 mmHg MV Mean grad:  2.0 mmHg    SHUNTS MV Vmax:       1.04 m/s    Systemic VTI:  0.24 m MV Vmean:  68.3 cm/s   Systemic Diam: 2.03 cm MV Decel Time: 183 msec MV E velocity: 92.20 cm/s MV A velocity: 90.10 cm/s MV E/A ratio:  1.02 Vinie Maxcy MD Electronically signed by Vinie Maxcy MD Signature Date/Time: 06/04/2024/2:40:39 PM    Final    DG CHEST PORT 1 VIEW Result Date: 06/03/2024 EXAM: 1 VIEW(S) XRAY OF THE CHEST 06/03/2024 03:22:00 PM COMPARISON: 06/01/2024 CLINICAL HISTORY: Hypoxia FINDINGS: LINES, TUBES AND DEVICES: Enteric tube in place with tip and side port below the diaphragm. Endotracheal tube in place with tip 5 cm above the carina. LUNGS AND PLEURA: No focal pulmonary opacity. No pulmonary edema. No pleural effusion. No pneumothorax. HEART AND MEDIASTINUM: No acute abnormality of the cardiac and mediastinal silhouettes. VASCULATURE: Atherosclerotic plaque. BONES AND SOFT TISSUES: No acute osseous abnormality. IMPRESSION: 1. No acute process. Electronically signed by: Donnice Mania MD 06/03/2024 05:34 PM EDT RP Workstation: [id number]   CT Head Wo Contrast Result Date: 06/01/2024 CLINICAL DATA:  Shortness of breath. EXAM: CT HEAD WITHOUT CONTRAST TECHNIQUE: Contiguous axial images were obtained from the base of the skull through the vertex without intravenous contrast. RADIATION DOSE REDUCTION: This exam was performed according to the departmental dose-optimization program which includes automated exposure control, adjustment of the mA and/or kV according to patient size and/or use of iterative reconstruction technique. COMPARISON:  None Available. FINDINGS: Brain: There is generalized cerebral atrophy with widening of the extra-axial spaces and ventricular dilatation. There are areas of  decreased attenuation within the white matter tracts of the supratentorial brain, consistent with microvascular disease changes. Vascular: Marked severity bilateral cavernous carotid calcification is noted. Skull: Normal. Negative for fracture or focal lesion. Sinuses/Orbits: Mild bilateral ethmoid sinus and marked severity bilateral nasal mucosal thickening is seen. Other: None. IMPRESSION: 1. Generalized cerebral atrophy with widening of the extra-axial spaces and ventricular dilatation. 2. Bilateral ethmoid sinus and marked severity bilateral nasal mucosal thickening. Electronically Signed   By: Suzen Dials M.D.   On: 06/01/2024 22:59   CT Angio Chest PE W and/or Wo Contrast Result Date: 06/01/2024 CLINICAL DATA:  Shortness of breath. EXAM: CT ANGIOGRAPHY CHEST WITH CONTRAST TECHNIQUE: Multidetector CT imaging of the chest was performed using the standard protocol during bolus administration of intravenous contrast. Multiplanar CT image reconstructions and MIPs were obtained to evaluate the vascular anatomy. RADIATION DOSE REDUCTION: This exam was performed according to the departmental dose-optimization program which includes automated exposure control, adjustment of the mA and/or kV according to patient size and/or use of iterative reconstruction technique. CONTRAST:  75mL OMNIPAQUE IOHEXOL 350 MG/ML SOLN COMPARISON:  July 25, 2022 FINDINGS: Cardiovascular: There is marked severity calcification of the thoracic aorta without evidence of aortic aneurysm or dissection. Satisfactory opacification of the pulmonary arteries to the segmental level. No evidence of pulmonary embolism. Normal heart size. There is marked severity coronary artery calcification. No pericardial effusion. Mediastinum/Nodes: Endotracheal and orogastric tubes are in place. No enlarged mediastinal, hilar, or axillary lymph nodes. A mild amount of heterogeneous low-attenuation is seen within the posterior aspect of the trachea at  the level of the carina and proximal left mainstem bronchus. The thyroid  gland and esophagus demonstrate no significant findings. Lungs/Pleura: Mild lingular and mild bibasilar linear scarring and/or atelectasis is seen. Mild to moderate severity bronchiectasis is also noted within the bilateral lower lobes. No pleural effusion or pneumothorax is identified. Upper Abdomen: No acute abnormality. Musculoskeletal: Multilevel degenerative changes are seen throughout the thoracic spine. Review of the MIP images confirms the above findings. IMPRESSION: 1.  No evidence of pulmonary embolism. 2. Mild lingular and mild bibasilar linear scarring and/or atelectasis. 3. Mild to moderate severity bilateral lower lobe bronchiectasis. 4. Marked severity coronary artery calcification. 5. Low attenuation within the trachea which may represent a mild amount of mucus. 6. Aortic atherosclerosis. Electronically Signed   By: Suzen Dials M.D.   On: 06/01/2024 22:57   DG Abdomen 1 View Result Date: 06/01/2024 EXAM: 1 VIEW XRAY OF THE ABDOMEN 06/01/2024 09:26:00 PM COMPARISON: None available. CLINICAL HISTORY: og tube. FINDINGS: LINES, TUBES AND DEVICES: Enteric tube in place with tip in the stomach. Side port is not identified. BOWEL: Nonobstructive bowel gas pattern. SOFT TISSUES: No opaque urinary calculi. BONES: No acute osseous abnormality. IMPRESSION: 1. Enteric tube tip in the stomach with side port not identified. Electronically signed by: Norman Gatlin MD 06/01/2024 09:35 PM EDT RP Workstation: [id number]   DG Chest Portable 1 View Result Date: 06/01/2024 EXAM: 1 VIEW(S) XRAY OF THE CHEST 06/01/2024 09:25:00 PM COMPARISON: 03/22/2024 CLINICAL HISTORY: post intubation. post intubation FINDINGS: LINES, TUBES AND DEVICES: Endotracheal tube with tip 6 cm above the carina. Enteric tube coursing below the hemidiaphragm. LUNGS AND PLEURA: Lung hyperinflation. No focal pulmonary opacity. No pulmonary edema. No pleural effusion.  No pneumothorax. HEART AND MEDIASTINUM: Aortic calcification. No acute abnormality of the cardiac and mediastinal silhouettes. BONES AND SOFT TISSUES: No acute osseous abnormality. IMPRESSION: 1. Endotracheal tube tip 6 cm above the carina. 2. Lung hyperinflation. Electronically signed by: Norman Gatlin MD 06/01/2024 09:34 PM EDT RP Workstation: [id number]    Microbiology: Results for orders placed or performed during the hospital encounter of 06/01/24  Resp panel by RT-PCR (RSV, Flu A&B, Covid) Anterior Nasal Swab     Status: None   Collection Time: 06/01/24  9:14 PM   Specimen: Anterior Nasal Swab  Result Value Ref Range Status   SARS Coronavirus 2 by RT PCR NEGATIVE NEGATIVE Final    Comment: (NOTE) SARS-CoV-2 target nucleic acids are NOT DETECTED.  The SARS-CoV-2 RNA is generally detectable in upper respiratory specimens during the acute phase of infection. The lowest concentration of SARS-CoV-2 viral copies this assay can detect is 138 copies/mL. A negative result does not preclude SARS-Cov-2 infection and should not be used as the sole basis for treatment or other patient management decisions. A negative result may occur with  improper specimen collection/handling, submission of specimen other than nasopharyngeal swab, presence of viral mutation(s) within the areas targeted by this assay, and inadequate number of viral copies(<138 copies/mL). A negative result must be combined with clinical observations, patient history, and epidemiological information. The expected result is Negative.  Fact Sheet for Patients:  BloggerCourse.com  Fact Sheet for Healthcare Providers:  SeriousBroker.it  This test is no t yet approved or cleared by the [place]  FDA and  has been authorized for detection and/or diagnosis of SARS-CoV-2 by FDA under an Emergency Use Authorization (EUA). This EUA will remain  in effect (meaning this test can be  used) for the duration of the COVID-19 declaration under Section 564(b)(1) of the Act, 21 U.S.C.section 360bbb-3(b)(1), unless the authorization is terminated  or revoked sooner.       Influenza A by PCR NEGATIVE NEGATIVE Final   Influenza B by PCR NEGATIVE NEGATIVE Final    Comment: (NOTE) The Xpert Xpress SARS-CoV-2/FLU/RSV plus assay is intended as an aid in the diagnosis of influenza from Nasopharyngeal swab specimens and should not be used as a sole basis for treatment. Nasal washings and aspirates are unacceptable  for Xpert Xpress SARS-CoV-2/FLU/RSV testing.  Fact Sheet for Patients: BloggerCourse.com  Fact Sheet for Healthcare Providers: SeriousBroker.it  This test is not yet approved or cleared by the [place]  FDA and has been authorized for detection and/or diagnosis of SARS-CoV-2 by FDA under an Emergency Use Authorization (EUA). This EUA will remain in effect (meaning this test can be used) for the duration of the COVID-19 declaration under Section 564(b)(1) of the Act, 21 U.S.C. section 360bbb-3(b)(1), unless the authorization is terminated or revoked.     Resp Syncytial Virus by PCR NEGATIVE NEGATIVE Final    Comment: (NOTE) Fact Sheet for Patients: BloggerCourse.com  Fact Sheet for Healthcare Providers: SeriousBroker.it  This test is not yet approved or cleared by the [place]  FDA and has been authorized for detection and/or diagnosis of SARS-CoV-2 by FDA under an Emergency Use Authorization (EUA). This EUA will remain in effect (meaning this test can be used) for the duration of the COVID-19 declaration under Section 564(b)(1) of the Act, 21 U.S.C. section 360bbb-3(b)(1), unless the authorization is terminated or revoked.  Performed at [facility], [address], [place], [place] [address]   Blood culture (routine x 2)     Status: None    Collection Time: 06/01/24  9:31 PM   Specimen: BLOOD  Result Value Ref Range Status   Specimen Description BLOOD BLOOD RIGHT HAND  Final   Special Requests NONE  Final   Culture   Final    NO GROWTH 5 DAYS Performed at [facility], [address], [place], [place] [address]    Report Status 06/06/2024 FINAL  Final  Blood culture (routine x 2)     Status: None   Collection Time: 06/01/24  9:35 PM   Specimen: BLOOD  Result Value Ref Range Status   Specimen Description BLOOD  Final   Special Requests NONE  Final   Culture   Final    NO GROWTH 5 DAYS Performed at [facility], [address] [place], [place] [address]    Report Status 06/06/2024 FINAL  Final  MRSA Next Gen by PCR, Nasal     Status: None   Collection Time: 06/02/24  1:55 AM   Specimen: Nasal Mucosa; Nasal Swab  Result Value Ref Range Status   MRSA by PCR Next Gen NOT DETECTED NOT DETECTED Final    Comment: (NOTE) The GeneXpert MRSA Assay (FDA approved for NASAL specimens only), is one component of a comprehensive MRSA colonization surveillance program. It is not intended to diagnose MRSA infection nor to guide or monitor treatment for MRSA infections. Test performance is not FDA approved in patients less than 49 years old. Performed at [facility], [address] [place], [place] [address]   Respiratory (~20 pathogens) panel by PCR     Status: None   Collection Time: 06/05/24  4:27 PM   Specimen: Nasopharyngeal Swab; Respiratory  Result Value Ref Range Status   Adenovirus NOT DETECTED NOT DETECTED Final   Coronavirus 229E NOT DETECTED NOT DETECTED Final    Comment: (NOTE) The Coronavirus on the Respiratory Panel, DOES NOT test for the novel  Coronavirus (2019 nCoV)    Coronavirus HKU1 NOT DETECTED NOT DETECTED Final   Coronavirus NL63 NOT DETECTED NOT DETECTED Final   Coronavirus OC43 NOT DETECTED NOT DETECTED Final   Metapneumovirus NOT DETECTED NOT DETECTED Final   Rhinovirus / Enterovirus NOT  DETECTED NOT DETECTED Final   Influenza A NOT DETECTED NOT DETECTED Final   Influenza B  NOT DETECTED NOT DETECTED Final   Parainfluenza Virus 1 NOT DETECTED NOT DETECTED Final   Parainfluenza Virus 2 NOT DETECTED NOT DETECTED Final   Parainfluenza Virus 3 NOT DETECTED NOT DETECTED Final   Parainfluenza Virus 4 NOT DETECTED NOT DETECTED Final   Respiratory Syncytial Virus NOT DETECTED NOT DETECTED Final   Bordetella pertussis NOT DETECTED NOT DETECTED Final   Bordetella Parapertussis NOT DETECTED NOT DETECTED Final   Chlamydophila pneumoniae NOT DETECTED NOT DETECTED Final   Mycoplasma pneumoniae NOT DETECTED NOT DETECTED Final    Comment: Performed at [facility], [address], [place], [place] [address]    Labs: CBC: Recent Labs  Lab 06/19/24 0424  WBC 10.6*  HGB 10.9*  HCT 32.7*  MCV 94.8  PLT 426*   Basic Metabolic Panel: Recent Labs  Lab 06/18/24 1646 06/19/24 0424  NA 132*  --   K 5.0  --   CL 95*  --   CO2 26  --   GLUCOSE 144*  --   BUN 26*  --   CREATININE 0.87  --   CALCIUM  8.0*  --   MG  --  2.0   Liver Function Tests: Recent Labs  Lab 06/18/24 1646  AST 28  ALT 67*  ALKPHOS 69  BILITOT 1.6*  PROT 5.4*  ALBUMIN 2.8*   CBG: Recent Labs  Lab 06/20/24 1616 06/20/24 2100 06/21/24 0023 06/21/24 0441 06/21/24 0751  GLUCAP 136* 141* 105* 102* 115*    Discharge time spent: greater than 30 minutes.  Signed: Deon Ivey, MD [facility] 06/21/2024

## 2024-06-22 DIAGNOSIS — E440 Moderate protein-calorie malnutrition: Secondary | ICD-10-CM | POA: Diagnosis not present

## 2024-06-22 DIAGNOSIS — J9622 Acute and chronic respiratory failure with hypercapnia: Secondary | ICD-10-CM | POA: Diagnosis not present

## 2024-06-22 DIAGNOSIS — Z9981 Dependence on supplemental oxygen: Secondary | ICD-10-CM | POA: Diagnosis not present

## 2024-06-22 DIAGNOSIS — J441 Chronic obstructive pulmonary disease with (acute) exacerbation: Secondary | ICD-10-CM | POA: Diagnosis not present

## 2024-06-23 DIAGNOSIS — J441 Chronic obstructive pulmonary disease with (acute) exacerbation: Secondary | ICD-10-CM | POA: Diagnosis not present

## 2024-06-23 DIAGNOSIS — R338 Other retention of urine: Secondary | ICD-10-CM | POA: Diagnosis not present

## 2024-06-23 DIAGNOSIS — E440 Moderate protein-calorie malnutrition: Secondary | ICD-10-CM | POA: Diagnosis not present

## 2024-06-25 DIAGNOSIS — E440 Moderate protein-calorie malnutrition: Secondary | ICD-10-CM | POA: Diagnosis not present

## 2024-06-25 DIAGNOSIS — R338 Other retention of urine: Secondary | ICD-10-CM | POA: Diagnosis not present

## 2024-06-25 DIAGNOSIS — J9622 Acute and chronic respiratory failure with hypercapnia: Secondary | ICD-10-CM | POA: Diagnosis not present

## 2024-06-25 DIAGNOSIS — J441 Chronic obstructive pulmonary disease with (acute) exacerbation: Secondary | ICD-10-CM | POA: Diagnosis not present

## 2024-08-24 ENCOUNTER — Ambulatory Visit: Admitting: Urology

## 2024-11-09 ENCOUNTER — Ambulatory Visit: Admitting: Urology
